# Patient Record
Sex: Male | Born: 1960 | Race: White | Hispanic: Yes | Marital: Married | Smoking: Never smoker
Health system: Southern US, Community
[De-identification: ages and names within clinical notes are randomized; demographics above are authoritative.]

## PROBLEM LIST (undated history)

## (undated) DIAGNOSIS — C801 Malignant (primary) neoplasm, unspecified: Secondary | ICD-10-CM

## (undated) DIAGNOSIS — H9190 Unspecified hearing loss, unspecified ear: Secondary | ICD-10-CM

## (undated) DIAGNOSIS — I493 Ventricular premature depolarization: Secondary | ICD-10-CM

## (undated) DIAGNOSIS — E785 Hyperlipidemia, unspecified: Secondary | ICD-10-CM

## (undated) DIAGNOSIS — C61 Malignant neoplasm of prostate: Secondary | ICD-10-CM

## (undated) DIAGNOSIS — E291 Testicular hypofunction: Secondary | ICD-10-CM

## (undated) DIAGNOSIS — K219 Gastro-esophageal reflux disease without esophagitis: Secondary | ICD-10-CM

## (undated) DIAGNOSIS — R972 Elevated prostate specific antigen [PSA]: Secondary | ICD-10-CM

## (undated) HISTORY — DX: Unspecified hearing loss, unspecified ear: H91.90

## (undated) HISTORY — DX: Gastro-esophageal reflux disease without esophagitis: K21.9

## (undated) HISTORY — PX: PROSTATE BIOPSY: SHX241

## (undated) HISTORY — PX: TONSILLECTOMY: SHX5217

## (undated) HISTORY — DX: Testicular hypofunction: E29.1

## (undated) HISTORY — DX: Malignant (primary) neoplasm, unspecified: C80.1

## (undated) HISTORY — PX: PROSTATE SURGERY: SHX751

---

## 2008-08-25 ENCOUNTER — Encounter (INDEPENDENT_AMBULATORY_CARE_PROVIDER_SITE_OTHER): Payer: Self-pay | Admitting: Cardiology

## 2008-08-25 ENCOUNTER — Ambulatory Visit (HOSPITAL_COMMUNITY): Admission: RE | Admit: 2008-08-25 | Discharge: 2008-08-25 | Payer: Self-pay | Admitting: Cardiology

## 2011-10-31 ENCOUNTER — Ambulatory Visit (INDEPENDENT_AMBULATORY_CARE_PROVIDER_SITE_OTHER): Payer: 59 | Admitting: Family Medicine

## 2011-10-31 VITALS — BP 122/74 | HR 62 | Temp 98.3°F | Resp 16 | Ht 70.0 in | Wt 250.4 lb

## 2011-10-31 DIAGNOSIS — K219 Gastro-esophageal reflux disease without esophagitis: Secondary | ICD-10-CM | POA: Insufficient documentation

## 2011-10-31 DIAGNOSIS — E291 Testicular hypofunction: Secondary | ICD-10-CM | POA: Insufficient documentation

## 2011-10-31 DIAGNOSIS — J029 Acute pharyngitis, unspecified: Secondary | ICD-10-CM

## 2011-10-31 DIAGNOSIS — E236 Other disorders of pituitary gland: Secondary | ICD-10-CM

## 2011-10-31 DIAGNOSIS — L089 Local infection of the skin and subcutaneous tissue, unspecified: Secondary | ICD-10-CM

## 2011-10-31 HISTORY — DX: Gastro-esophageal reflux disease without esophagitis: K21.9

## 2011-10-31 HISTORY — DX: Testicular hypofunction: E29.1

## 2011-10-31 MED ORDER — DOXYCYCLINE HYCLATE 100 MG PO TABS: 100.0000 mg | ORAL_TABLET | Freq: Two times a day (BID) | ORAL | Status: DC

## 2011-10-31 NOTE — Progress Notes (Signed)
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 3 problems: 1. Sore throat x 5 days, with fluctuating temp, and one day of diarrhea on day 2 of illness. 2. Skin infection, suprapubic area with 3-4 weeks intermittent d/c 3. Follow up for hypogonadism.  Still having trouble losing weight.  Last testosterone was 152 8 months ago.  Objective: Very muscular heavyset man in no acute distress, cooperative and friendly  HEENT: Mild erythema in posterior pharynx, SOM and left ear, no adenopathy or thyromegaly.  Skin: 1 cm indurated area at the belt line in the suprapubic region with no drainage, violaceous in color  Assessment: Pharyngitis, stat infection of the skin, hypergonadism.  Plan:  Doxycycline 100 twice a day x7 days  Check total testosterone, consider testosterone replacement

## 2011-11-02 ENCOUNTER — Telehealth: Payer: Self-pay

## 2011-11-02 LAB — TESTOSTERONE: Testosterone: 192.74 ng/dL — ABNORMAL LOW (ref 250–890)

## 2011-11-02 NOTE — Telephone Encounter (Signed)
Pt would like to know if his lab results are in yet.

## 2011-11-03 NOTE — Telephone Encounter (Signed)
Christopher [name] already spoke with pt today and informed lab results

## 2012-01-04 ENCOUNTER — Ambulatory Visit (INDEPENDENT_AMBULATORY_CARE_PROVIDER_SITE_OTHER): Payer: 59 | Admitting: Family Medicine

## 2012-01-04 VITALS — BP 139/76 | HR 61 | Temp 98.2°F | Resp 18 | Ht 71.5 in | Wt 238.0 lb

## 2012-01-04 DIAGNOSIS — L709 Acne, unspecified: Secondary | ICD-10-CM

## 2012-01-04 DIAGNOSIS — E291 Testicular hypofunction: Secondary | ICD-10-CM

## 2012-01-04 DIAGNOSIS — E236 Other disorders of pituitary gland: Secondary | ICD-10-CM

## 2012-01-04 DIAGNOSIS — R51 Headache: Secondary | ICD-10-CM

## 2012-01-04 LAB — POCT CBC
MCH, POC: 29.3 pg (ref 27–31.2)
MCV: 86.6 fL (ref 80–97)
MID (cbc): 0.5 (ref 0–0.9)
POC LYMPH PERCENT: 46.2 %L (ref 10–50)
Platelet Count, POC: 280 10*3/uL (ref 142–424)
RDW, POC: 13.3 %
WBC: 7.2 10*3/uL (ref 4.6–10.2)

## 2012-01-04 MED ORDER — CYCLOBENZAPRINE HCL 10 MG PO TABS: 10.0000 mg | ORAL_TABLET | Freq: Every evening | ORAL | Status: DC | PRN

## 2012-01-04 NOTE — Progress Notes (Signed)
  Subjective:    Patient ID: Christopher [name], male    DOB: Feb 12, 1961, 51 y.o.   MRN: [id number]  HPI Patient presents complaining of sharp shooting pains that are fleeting for the last 7-10 days. Always in temporal/parietal region.  Denies N/V, photo or phonophobia Patient states the pain more frequent at night.  Denies N,V or focal deficits. No history of migraines or cluster headaches No recent illness No stressors  Would like to be tested for ASA allergy; mother told him he was allergic to aspirin as a child(facial swelling)  H/O of hypogonadism- would like to have his testosterone level checked Review of Systems     Objective:   Physical Exam  Constitutional: He appears well-developed.  HENT:  Head: Normocephalic and atraumatic.  Right Ear: External ear normal.  Left Ear: External ear normal.  Nose: Nose normal.  Mouth/Throat: Oropharynx is clear and moist.  Eyes: Pupils are equal, round, and reactive to light.  Neck: Normal range of motion. Neck supple. No thyromegaly present.  Cardiovascular: Normal rate, regular rhythm and normal heart sounds.   Pulmonary/Chest: Effort normal and breath sounds normal.  Abdominal: Soft. Bowel sounds are normal. There is no hepatosplenomegaly.  Musculoskeletal: Normal range of motion.  Neurological: He is alert. He has normal strength. He displays normal reflexes. No cranial nerve deficit.  Reflex Scores:      Bicep reflexes are 2+ on the right side and 2+ on the left side.      Patellar reflexes are 2+ on the right side and 2+ on the left side. Skin: Skin is warm.       Erythema and papules on face          Assessment & Plan:   1. Headache  POCT CBC, cyclobenzaprine (FLEXERIL) 10 MG tablet, CT Head Wo Contrast, Sedimentation Rate  2. Hypogonadism male  Testosterone  3. Acne      Anticipatory guidance Follow up after imaging and BW back

## 2012-01-05 LAB — SEDIMENTATION RATE: Sed Rate: 1 mm/hr (ref 0–16)

## 2012-01-09 ENCOUNTER — Ambulatory Visit
Admission: RE | Admit: 2012-01-09 | Discharge: 2012-01-09 | Disposition: A | Discharge status: Home / Self Care | Payer: Self-pay | Source: Ambulatory Visit | Attending: Family Medicine | Admitting: Family Medicine

## 2012-01-09 DIAGNOSIS — R51 Headache: Secondary | ICD-10-CM

## 2012-01-10 ENCOUNTER — Telehealth: Payer: Self-pay

## 2012-01-10 NOTE — Telephone Encounter (Signed)
CT of the brain was normal. Blood work shows low testosterone If patient would like treatment for his low testosterone encourage him to RTC. I will need to review with him proper use of testosterone.

## 2012-01-10 NOTE — Telephone Encounter (Signed)
Can you please look over these test and let me know what to tell wife.

## 2012-01-10 NOTE — Telephone Encounter (Signed)
Spoke with wife gave results. She would like to know what the next step is regarding the pain in his head. Please advise

## 2012-01-10 NOTE — Telephone Encounter (Signed)
LINDA STATES HER HUSBAND HAD A CT AND LAB WORK DONE AND WOULD LIKE TO KNOW RESULTS PLEASE CALL [phone]

## 2012-01-13 ENCOUNTER — Telehealth: Payer: Self-pay | Admitting: Family Medicine

## 2012-01-14 NOTE — Telephone Encounter (Signed)
Notes Recorded by Dois Davenport, MD on 01/13/2012 at 2:54 PM Patient aware of lab and CT results. Wife(see telephone note) would like clarity of the fleeting pains  patient experiences in his head. With normal labs and pattern not consistent with a particular headache syndrome he can try symptomatic treatment with flexeril 10 QHS or we can be refer him to Dr. Vela Prose for evaluation/ reassurance.  Additionally his Testosterone is quite low and I would encourage a follow up visit for treatment of his hypogonadism given he is only 51 years old.

## 2012-01-23 NOTE — Telephone Encounter (Signed)
See lab results.  

## 2012-02-12 ENCOUNTER — Ambulatory Visit (INDEPENDENT_AMBULATORY_CARE_PROVIDER_SITE_OTHER): Payer: 59 | Admitting: Family Medicine

## 2012-02-12 DIAGNOSIS — J309 Allergic rhinitis, unspecified: Secondary | ICD-10-CM

## 2012-02-12 MED ORDER — MOMETASONE FUROATE 50 MCG/ACT NA SUSP: 2.0000 | Freq: Every day | NASAL | Status: DC

## 2012-02-12 MED ORDER — CETIRIZINE HCL 10 MG PO TABS: 10.0000 mg | ORAL_TABLET | Freq: Every day | ORAL | Status: DC

## 2012-02-12 NOTE — Progress Notes (Signed)
  Subjective:    Patient ID: Christopher [name], male    DOB: [birth year], 51 y.o.   MRN: [id number]  HPI  Patient complains of ST that started yesterday. Pain when talking Denies facial congestion, PND or cough. Denies fever or chills  No sick contacts Review of Systems     Objective:   Physical Exam  Constitutional: He appears well-developed.  HENT:  Nose: Mucosal edema present.       Clear PND without erythema to the posterior pharnx  Neck: Neck supple.  Lymphadenopathy:    He has no cervical adenopathy.          Assessment & Plan:   1. Allergic rhinitis  mometasone (NASONEX) 50 MCG/ACT nasal spray, cetirizine (ZYRTEC) 10 MG tablet   Anticipatory guidance

## 2012-06-02 ENCOUNTER — Ambulatory Visit (INDEPENDENT_AMBULATORY_CARE_PROVIDER_SITE_OTHER): Payer: 59 | Admitting: Family Medicine

## 2012-06-02 VITALS — BP 122/80 | HR 71 | Temp 98.4°F | Resp 18 | Ht 70.0 in | Wt 271.0 lb

## 2012-06-02 DIAGNOSIS — R002 Palpitations: Secondary | ICD-10-CM

## 2012-06-02 NOTE — Patient Instructions (Addendum)
We will set you up to see Dr. York Spaniel office again to have an "event monitor" done for your heart.  If you have any problems in the meantime let us know!  If you are having chest pain or other concerning symptoms please get help right away

## 2012-06-02 NOTE — Progress Notes (Signed)
[facility] [address] [place] [address] [phone]  Date:  06/02/2012   Name:  Christopher [name]   DOB:  [birth year]   MRN:  [id number]  PCP:  No primary provider on file.    Chief Complaint: Palpitations   History of Present Illness:  Christopher [name] is a 51 y.o. 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  Since yesterday he has noted them very often- he noted overnight and again this morning. They are occuring so often that he got worried.  He can feel SOB when he has more of the PVCs, but is ok when they ease off.  He has not noted any CP.    Generally he noted occasional PVC during the evening when he is relaxed.  He may have a few every evening.  He does not have any other history of cardiac problems that he is aware of.  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    Khalil saw Dr. Donnie Aho in 2010 and had a cardiac evaluation.  He did an ETT which was negative.  It looks like he also had an echo but I cannot see this result.  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    He does not note any unusual stressors. He did eat more heaving this past weekend as he was out of town.  He feels that his sleep has been ok recently.  [name] does not generally use caffeine except for the occasional soda, and this has not changed. He is not using any supplements/ weight loss products, etc.  He does not use tobacco or alcohol, and does not use drugs.    Patient Active Problem List  Diagnosis  . GERD (gastroesophageal reflux disease)  . Hypogonadism male    No past medical history on file.  No past surgical history on file.  History  Substance Use Topics  . Smoking status: Never Smoker   . Smokeless tobacco: Not on file  . Alcohol Use: Not on file    No family history on file.  Allergies  Allergen Reactions  . Asa  Arthritis Strength-Antacid (Aspirin Buffered)   . Penicillins   . Sulfa Antibiotics     Medication list has been reviewed and updated.  Current Outpatient Prescriptions on File Prior to Visit  Medication Sig Dispense Refill  . acetaminophen (TYLENOL) 325 MG tablet Take 650 mg by mouth every 6 (six) hours as needed.      . cetirizine (ZYRTEC) 10 MG tablet Take 1 tablet (10 mg total) by mouth daily.  30 tablet  11  . minocycline (DYNACIN) 50 MG tablet Take 50 mg by mouth 2 (two) times daily.      . mometasone (NASONEX) 50 MCG/ACT nasal spray Place 2 sprays into the nose daily.  1 g  12  . Sulfacetamide Sodium 10 % CREA Apply topically.        Review of Systems:  As per HPI- otherwise negative. No other symptoms noted, he does not have CP  Physical Examination: Filed Vitals:   06/02/12 1417  BP: 122/80  Pulse: 71  Temp: 98.4 F (36.9 C)  Resp: 18   Filed Vitals:   06/02/12 1417  Height: 5\' 10"  (1.778 m)  Weight: 271 lb (122.925 kg)   Body mass index  is 38.88 kg/(m^2). Ideal Body Weight: Weight in (lb) to have BMI = 25: 173.9   GEN: WDWN, NAD, Non-toxic, A & O x 3 HEENT: Atraumatic, Normocephalic. Neck supple. No masses, No LAD. Bilateral TM wnl, oropharynx normal.  PEERL,EOMI.   Ears and Nose: No external deformity. CV: RRR, No M/G/R. No JVD. No thrill. No extra heart sounds.  I do not hear an arrythmia.   PULM: CTA B, no wheezes, crackles, rhonchi. No retractions. No resp. distress. No accessory muscle use. ABD: S, NT, ND, +BS. No rebound. No HSM. EXTR: No c/c/e NEURO Normal gait.  PSYCH: Normally interactive. Conversant. Not depressed or anxious appearing.  Calm demeanor.   EKG: NSR, no ST elevation or depression.  Several pages of rhythm reveals 2 PACs.    Assessment and Plan: 1. Palpitations  EKG 12-Lead, Cardiac event monitor   Quinterious has a long history of PVCs/ PACs, but he has had more than usual for the last 24 hours.  Spoke with Dr. Donnie Aho who has seen him  in the past.  He recommended looking for any possible causes such as excessive caffeine use (there do not seem to be any) and to have [name] do an event monitor.  We will arrange a cardiac event monitor and he will follow-up with Dr. Donnie Aho.  See pt instructions for more.   Abbe Amsterdam, MD

## 2012-11-13 ENCOUNTER — Ambulatory Visit (INDEPENDENT_AMBULATORY_CARE_PROVIDER_SITE_OTHER): Payer: 59 | Admitting: Emergency Medicine

## 2012-11-13 VITALS — BP 123/75 | HR 66 | Temp 98.6°F | Resp 16 | Ht 70.5 in | Wt 238.2 lb

## 2012-11-13 DIAGNOSIS — R52 Pain, unspecified: Secondary | ICD-10-CM

## 2012-11-13 DIAGNOSIS — E291 Testicular hypofunction: Secondary | ICD-10-CM

## 2012-11-13 DIAGNOSIS — J3489 Other specified disorders of nose and nasal sinuses: Secondary | ICD-10-CM

## 2012-11-13 DIAGNOSIS — R7989 Other specified abnormal findings of blood chemistry: Secondary | ICD-10-CM

## 2012-11-13 DIAGNOSIS — R0981 Nasal congestion: Secondary | ICD-10-CM

## 2012-11-13 DIAGNOSIS — J329 Chronic sinusitis, unspecified: Secondary | ICD-10-CM

## 2012-11-13 MED ORDER — AZITHROMYCIN 250 MG PO TABS: ORAL_TABLET | ORAL | Status: DC

## 2012-11-13 NOTE — Progress Notes (Signed)
  Subjective:    Patient ID: Christopher [name], male    DOB: Jan 25, 1961, 52 y.o.   MRN: [id number]  HPI Pt presents with aches and congestion since Sunday. Feels better with Tylenol cold and sinus but when it wears off, he feels bad again. Slight temp at ~99.2 for the last 2-3 days.   No cough or ST. Blowing out yellow mucus from his nose.  No else at home is sick. Had not had flu shot this year.  Also needs Testosterone checked. He has a history of low testosterone with his last testosterone level about one year ago being 193      Review of Systems     Objective:   Physical Exam HEENT exam reveals nasal congestion. TMs are clear. Throat is normal. Neck supple chest is clear to both auscultation and percussion. Heart regular rate no murmurs   Results for orders placed in visit on 11/13/12  POCT INFLUENZA A/B      Result Value Range   Influenza A, POC Negative     Influenza B, POC Negative          Assessment & Plan:  Testosterone level and PSA levels were done today. We'll treat with a Z-Pak . He has a history of low testosterone so we'll check his testosterone level today .

## 2012-11-14 LAB — TESTOSTERONE, FREE, TOTAL, SHBG: Testosterone-% Free: 1.9 % (ref 1.6–2.9)

## 2012-12-04 ENCOUNTER — Encounter: Payer: Self-pay | Admitting: Family Medicine

## 2012-12-04 ENCOUNTER — Ambulatory Visit (INDEPENDENT_AMBULATORY_CARE_PROVIDER_SITE_OTHER): Payer: 59 | Admitting: Family Medicine

## 2012-12-04 VITALS — BP 130/80 | HR 55 | Temp 98.0°F | Resp 16 | Ht 69.5 in | Wt 230.0 lb

## 2012-12-04 DIAGNOSIS — E291 Testicular hypofunction: Secondary | ICD-10-CM

## 2012-12-04 DIAGNOSIS — Z23 Encounter for immunization: Secondary | ICD-10-CM

## 2012-12-04 DIAGNOSIS — Z0000 Encounter for general adult medical examination without abnormal findings: Secondary | ICD-10-CM

## 2012-12-04 LAB — POCT URINALYSIS DIPSTICK
Bilirubin, UA: NEGATIVE
Glucose, UA: NEGATIVE
Ketones, UA: NEGATIVE
Leukocytes, UA: NEGATIVE
Nitrite, UA: NEGATIVE
Protein, UA: NEGATIVE
Spec Grav, UA: 1.01
Urobilinogen, UA: 0.2
pH, UA: 7

## 2012-12-04 LAB — CBC
HCT: 41.4 % (ref 39.0–52.0)
Hemoglobin: 14.3 g/dL (ref 13.0–17.0)
MCH: 28.7 pg (ref 26.0–34.0)
MCHC: 34.5 g/dL (ref 30.0–36.0)
MCV: 83.1 fL (ref 78.0–100.0)
Platelets: 287 10*3/uL (ref 150–400)
RBC: 4.98 MIL/uL (ref 4.22–5.81)
RDW: 13.8 % (ref 11.5–15.5)
WBC: 4.4 10*3/uL (ref 4.0–10.5)

## 2012-12-04 MED ORDER — TETANUS-DIPHTH-ACELL PERTUSSIS 5-2.5-18.5 LF-MCG/0.5 IM SUSP: 0.5000 mL | Freq: Once | INTRAMUSCULAR | Status: DC

## 2012-12-04 NOTE — Patient Instructions (Signed)
Results for orders placed in visit on 11/13/12  PSA      Result Value Range   PSA 0.79  <=4.00 ng/mL  TESTOSTERONE, FREE, TOTAL      Result Value Range   Testosterone 215 (*) 300 - 890 ng/dL   Sex Hormone Binding 34  13 - 71 nmol/L   Testosterone, Free 40.3 (*) 47.0 - 244.0 pg/mL   Testosterone-% Freee. 1.9  1.6 - 2.9 %  POCT INFLUENZA A/B      Result Value Range   Influenza A, POC Negative     Influenza B, POC Negative

## 2012-12-04 NOTE — Progress Notes (Signed)
  Subjective:    Patient ID: Christopher [name], male    DOB: [birth year], 52 y.o.   MRN: [id number]  HPI Exercise:  5x per week:  Gym:  Lifting weight and cardio Weight:  Lost weight over the holidays by more regular exercise. 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   HENT: Negative.   Eyes: Negative.   Respiratory: Negative.   Cardiovascular: Negative.   Gastrointestinal: Negative.   Endocrine: Negative.   Genitourinary: Negative.   Musculoskeletal: Positive for back pain.  Skin: Negative.   Allergic/Immunologic: Negative.   Neurological: Negative.   Hematological: Negative.   Psychiatric/Behavioral: Negative.    F/H prostate cancer (father)    Objective:   Physical Exam Well adult middle-aged adult male in no acute distress HEENT: Normal TMs: Normal Neck: Supple no adenopathy or thyromegaly, no bruits, Oropharynx: Good dentition, no oral lesions Chest: Clear Heart: Regular without murmur, gallop or rub Abdomen: Soft nontender without HSM Genitourinary: Normal without hernia. Results for orders placed in visit on 12/04/12  POCT URINALYSIS DIPSTICK      Result Value Range   Color, UA yellow     Clarity, UA clear     Glucose, UA neg     Bilirubin, UA neg     Ketones, UA neg     Spec Grav, UA 1.010     Blood, UA trace     pH, UA 7.0     Protein, UA neg     Urobilinogen, UA 0.2     Nitrite, UA neg     Leukocytes, UA Negative         Assessment & Plan:  Hypogonadism male - Plan: Testosterone, Comprehensive metabolic panel, Lipid Panel, CBC, POCT urinalysis dipstick  Routine general medical examination at a health care facility - Plan: Testosterone, Comprehensive metabolic panel, Lipid Panel, CBC, POCT urinalysis dipstick, Ambulatory referral to Gastroenterology, TDaP (BOOSTRIX) injection 0.5 mL  Continue exercise.

## 2012-12-05 LAB — LIPID PANEL
Cholesterol: 184 mg/dL (ref 0–200)
HDL: 37 mg/dL — ABNORMAL LOW (ref >39)
LDL Cholesterol: 134 mg/dL — ABNORMAL HIGH (ref 0–99)
Total CHOL/HDL Ratio: 5 Ratio
Triglycerides: 64 mg/dL (ref <150)
VLDL: 13 mg/dL (ref 0–40)

## 2012-12-05 LAB — COMPREHENSIVE METABOLIC PANEL
ALT: 16 U/L (ref 0–53)
AST: 24 U/L (ref 0–37)
Albumin: 4.5 g/dL (ref 3.5–5.2)
Alkaline Phosphatase: 58 U/L (ref 39–117)
BUN: 19 mg/dL (ref 6–23)
CO2: 27 mEq/L (ref 19–32)
Calcium: 9.5 mg/dL (ref 8.4–10.5)
Chloride: 101 mEq/L (ref 96–112)
Creat: 1.02 mg/dL (ref 0.50–1.35)
Glucose, Bld: 93 mg/dL (ref 70–99)
Potassium: 4.3 mEq/L (ref 3.5–5.3)
Sodium: 137 mEq/L (ref 135–145)
Total Bilirubin: 1.3 mg/dL — ABNORMAL HIGH (ref 0.3–1.2)
Total Protein: 7.2 g/dL (ref 6.0–8.3)

## 2012-12-05 LAB — TESTOSTERONE: Testosterone: 342 ng/dL (ref 300–890)

## 2013-04-28 ENCOUNTER — Ambulatory Visit (INDEPENDENT_AMBULATORY_CARE_PROVIDER_SITE_OTHER): Payer: 59 | Admitting: Family Medicine

## 2013-04-28 VITALS — BP 120/72 | HR 58 | Temp 97.9°F | Resp 18 | Ht 71.5 in | Wt 214.0 lb

## 2013-04-28 DIAGNOSIS — R39198 Other difficulties with micturition: Secondary | ICD-10-CM

## 2013-04-28 DIAGNOSIS — E291 Testicular hypofunction: Secondary | ICD-10-CM

## 2013-04-28 DIAGNOSIS — J069 Acute upper respiratory infection, unspecified: Secondary | ICD-10-CM

## 2013-04-28 DIAGNOSIS — R0982 Postnasal drip: Secondary | ICD-10-CM

## 2013-04-28 NOTE — Progress Notes (Signed)
[facility]:  Office Visit  Chief Complaint:  Chief Complaint  Patient presents with  . Sore Throat    x1 week  . Generalized Body Aches    HPI: Christopher [name] is a 52 y.o. male who complains of : 1. BPH-decrease urine stream x 6 weeks, no other sxs 2. 1 week history of viral illness, he has PND. Muscle aches.  Sorethroat getting better. Denies allergies/asthma, has had tonsillectomy. No recent travels or sick contacts. Has had subjective fevers.  Tried alt water gargles and nothing else.  3. He has family hx of prostate cancer, dad dx in his 27s 4. He would like tetstoterone check. Not on any testosterone supplement. He denies libido issues. He does work out a lot and emphatically denies steroid use.   History reviewed. No pertinent past medical history. Past Surgical History  Procedure Laterality Date  . Tonsillectomy     History   Social History  . Marital Status: Married    Spouse Name: N/A    Number of Children: N/A  . Years of Education: N/A   Occupational History  . Computer Programmer [employer]   Social History Main Topics  . Smoking status: Never Smoker   . Smokeless tobacco: None  . Alcohol Use: No  . Drug Use: No  . Sexually Active: Yes   Other Topics Concern  . None   Social History Narrative   Married.   Family History  Problem Relation Age of Onset  . Cancer Father   . Heart disease Mother    Allergies  Allergen Reactions  . Asa Arthritis Strength-Antacid (Aspirin Buffered)   . Penicillins   . Sulfa Antibiotics    Prior to Admission medications   Not on File     ROS: The patient denies fevers, chills, night sweats, unintentional weight loss, chest pain, palpitations, wheezing, dyspnea on exertion, nausea, vomiting, abdominal pain, dysuria, hematuria, melena, numbness, weakness, or tingling.   All other systems have been reviewed and were otherwise negative with the exception of those mentioned in the HPI and as  above.    PHYSICAL EXAM: Filed Vitals:   04/28/13 1638  BP: 120/72  Pulse: 58  Temp: 97.9 F (36.6 C)  Resp: 18   Filed Vitals:   04/28/13 1638  Height: 5' 11.5" (1.816 m)  Weight: 214 lb (97.07 kg)   Body mass index is 29.43 kg/(m^2).  General: Alert, no acute distress HEENT:  Normocephalic, atraumatic, oropharynx patent. No exudates. TM nl. No sinus tendereness Cardiovascular:  Regular rate and rhythm, no rubs murmurs or gallops.  No Carotid bruits, radial pulse intact. No pedal edema.  Respiratory: Clear to auscultation bilaterally.  No wheezes, rales, or rhonchi.  No cyanosis, no use of accessory musculature GI: No organomegaly, abdomen is soft and non-tender, positive bowel sounds.  No masses. Skin: No rashes. Neurologic: Facial musculature symmetric. Psychiatric: Patient is appropriate throughout our interaction. Lymphatic: No cervical lymphadenopathy Musculoskeletal: Gait intact. Prostate exam nl   LABS:    EKG/XRAY:   Primary read interpreted by Dr. Conley Rolls at [facility].   ASSESSMENT/PLAN: Encounter Diagnoses  Name Primary?  . PND (post-nasal drip) Yes  . Decreased urine stream   . Hypogonadism male   . Viral URI     Patient to return for lab visit only in AM for following labs CBC, free and total testerone, PSA F/u in AM I will call with his test results once we get them   LE, THAO PHUONG, DO  04/28/2013 6:02 PM

## 2013-04-29 ENCOUNTER — Other Ambulatory Visit: Payer: Self-pay | Admitting: Family Medicine

## 2013-04-29 ENCOUNTER — Other Ambulatory Visit (INDEPENDENT_AMBULATORY_CARE_PROVIDER_SITE_OTHER): Payer: 59

## 2013-04-29 ENCOUNTER — Telehealth: Payer: Self-pay | Admitting: Radiology

## 2013-04-29 DIAGNOSIS — R39198 Other difficulties with micturition: Secondary | ICD-10-CM

## 2013-04-29 DIAGNOSIS — J069 Acute upper respiratory infection, unspecified: Secondary | ICD-10-CM

## 2013-04-29 LAB — PSA: PSA: 0.84 ng/mL (ref <=4.00)

## 2013-04-29 LAB — POCT CBC
Granulocyte percent: 60.2 %G (ref 37–80)
HCT, POC: 44.5 % (ref 43.5–53.7)
Hemoglobin: 14.3 g/dL (ref 14.1–18.1)
Lymph, poc: 2 (ref 0.6–3.4)
MCH, POC: 29.6 pg (ref 27–31.2)
MCHC: 32.1 g/dL (ref 31.8–35.4)
MCV: 92.1 fL (ref 80–97)
MID (cbc): 0.5 (ref 0–0.9)
MPV: 8.6 fL (ref 0–99.8)
POC Granulocyte: 3.8 (ref 2–6.9)
POC LYMPH PERCENT: 32.4 % (ref 10–50)
POC MID %: 7.4 %M (ref 0–12)
Platelet Count, POC: 249 10*3/uL (ref 142–424)
RBC: 4.83 M/uL (ref 4.69–6.13)
RDW, POC: 13.8 %
WBC: 6.3 10*3/uL (ref 4.6–10.2)

## 2013-04-29 NOTE — Telephone Encounter (Signed)
Message copied by Caffie Damme on Wed Apr 29, 2013  3:16 PM ------      Message from: LE, [place] P      Created: Wed Apr 29, 2013  2:49 PM       Did we collect lab for testosterone for him the morning? I saw the CBC and also the PSA but not the testosteron, it states it still needs to be collected? Thanks Tle ------

## 2013-04-29 NOTE — Progress Notes (Signed)
Patient here for labs only. 

## 2013-04-29 NOTE — Telephone Encounter (Signed)
Yes, we can add this on, the lab did get the order. Thanks Terryn Redner

## 2013-04-30 LAB — TESTOSTERONE, FREE, TOTAL, SHBG
Sex Hormone Binding: 26 nmol/L (ref 13–71)
Testosterone, Free: 60.7 pg/mL (ref 47.0–244.0)
Testosterone-% Free: 2.2 % (ref 1.6–2.9)
Testosterone: 273 ng/dL — ABNORMAL LOW (ref 300–890)

## 2013-05-02 ENCOUNTER — Telehealth: Payer: Self-pay | Admitting: Family Medicine

## 2013-05-02 NOTE — Telephone Encounter (Addendum)
Spoke to patient about labs.

## 2013-07-23 ENCOUNTER — Other Ambulatory Visit: Payer: Self-pay

## 2013-12-17 ENCOUNTER — Encounter: Payer: Self-pay | Admitting: Family Medicine

## 2013-12-17 ENCOUNTER — Ambulatory Visit (INDEPENDENT_AMBULATORY_CARE_PROVIDER_SITE_OTHER): Payer: 59 | Admitting: Family Medicine

## 2013-12-17 ENCOUNTER — Ambulatory Visit: Payer: 59

## 2013-12-17 VITALS — BP 124/58 | HR 67 | Temp 98.2°F | Resp 16 | Ht 69.5 in | Wt 240.2 lb

## 2013-12-17 DIAGNOSIS — Z0000 Encounter for general adult medical examination without abnormal findings: Secondary | ICD-10-CM

## 2013-12-17 DIAGNOSIS — IMO0001 Reserved for inherently not codable concepts without codable children: Secondary | ICD-10-CM

## 2013-12-17 DIAGNOSIS — R5381 Other malaise: Secondary | ICD-10-CM

## 2013-12-17 DIAGNOSIS — R29890 Loss of height: Secondary | ICD-10-CM

## 2013-12-17 DIAGNOSIS — H9190 Unspecified hearing loss, unspecified ear: Secondary | ICD-10-CM

## 2013-12-17 DIAGNOSIS — R002 Palpitations: Secondary | ICD-10-CM

## 2013-12-17 DIAGNOSIS — M25539 Pain in unspecified wrist: Secondary | ICD-10-CM

## 2013-12-17 DIAGNOSIS — Z125 Encounter for screening for malignant neoplasm of prostate: Secondary | ICD-10-CM

## 2013-12-17 DIAGNOSIS — M25531 Pain in right wrist: Secondary | ICD-10-CM

## 2013-12-17 DIAGNOSIS — R5383 Other fatigue: Secondary | ICD-10-CM

## 2013-12-17 HISTORY — DX: Reserved for inherently not codable concepts without codable children: IMO0001

## 2013-12-17 HISTORY — DX: Unspecified hearing loss, unspecified ear: H91.90

## 2013-12-17 LAB — POCT URINALYSIS DIPSTICK
Bilirubin, UA: NEGATIVE
Glucose, UA: NEGATIVE
Ketones, UA: NEGATIVE
Leukocytes, UA: NEGATIVE
Nitrite, UA: NEGATIVE
Protein, UA: NEGATIVE
Spec Grav, UA: 1.01
Urobilinogen, UA: 0.2
pH, UA: 6.5

## 2013-12-17 LAB — COMPLETE METABOLIC PANEL WITHOUT GFR
ALT: 24 U/L (ref 0–53)
AST: 32 U/L (ref 0–37)
Albumin: 4.1 g/dL (ref 3.5–5.2)
Alkaline Phosphatase: 59 U/L (ref 39–117)
BUN: 17 mg/dL (ref 6–23)
CO2: 27 meq/L (ref 19–32)
Calcium: 9.1 mg/dL (ref 8.4–10.5)
Chloride: 102 meq/L (ref 96–112)
Creat: 1.39 mg/dL — ABNORMAL HIGH (ref 0.50–1.35)
GFR, Est African American: 67 mL/min
GFR, Est Non African American: 58 mL/min — ABNORMAL LOW
Glucose, Bld: 87 mg/dL (ref 70–99)
Potassium: 4.2 meq/L (ref 3.5–5.3)
Sodium: 138 meq/L (ref 135–145)
Total Bilirubin: 1.2 mg/dL (ref 0.2–1.2)
Total Protein: 7.1 g/dL (ref 6.0–8.3)

## 2013-12-17 LAB — LIPID PANEL
Cholesterol: 175 mg/dL (ref 0–200)
HDL: 44 mg/dL (ref >39)
LDL Cholesterol: 118 mg/dL — ABNORMAL HIGH (ref 0–99)
Total CHOL/HDL Ratio: 4 Ratio
Triglycerides: 66 mg/dL (ref <150)
VLDL: 13 mg/dL (ref 0–40)

## 2013-12-17 LAB — CBC
HCT: 43.6 % (ref 39.0–52.0)
Hemoglobin: 14.9 g/dL (ref 13.0–17.0)
MCH: 28.6 pg (ref 26.0–34.0)
MCHC: 34.2 g/dL (ref 30.0–36.0)
MCV: 83.7 fL (ref 78.0–100.0)
Platelets: 247 10*3/uL (ref 150–400)
RBC: 5.21 MIL/uL (ref 4.22–5.81)
RDW: 13.2 % (ref 11.5–15.5)
WBC: 5.5 10*3/uL (ref 4.0–10.5)

## 2013-12-17 LAB — TESTOSTERONE: Testosterone: 375 ng/dL (ref 300–890)

## 2013-12-17 LAB — IFOBT (OCCULT BLOOD): IFOBT: NEGATIVE

## 2013-12-17 NOTE — Progress Notes (Signed)
Patient ID: Christopher [name] MRN: [id number], DOB: [birth year] 53 y.o. Date of Encounter: 12/17/2013, 7:54 AM  Primary Physician: No primary provider on file.  Chief Complaint: Physical (CPE)  HPI: 53 y.o. y/o male with history noted below here for CPE.  Married Dance movement psychotherapist, Airline pilot Patient complains of low energy He also complains of right hypothenar pain Also has left shoulder soreness  Finally, patient is concerned with his loss of 2" height   Review of Systems: Consitutional: No fever, chills, fatigue, night sweats, lymphadenopathy, or weight changes. Eyes: No visual changes, eye redness, or discharge. ENT/Mouth: Ears: No otalgia, tinnitus, hearing loss, discharge. Nose: No congestion, rhinorrhea, sinus pain, or epistaxis. Throat: No sore throat, post nasal drip, or teeth pain. Cardiovascular: No CP, palpitations, diaphoresis, DOE, edema, orthopnea, PND. Respiratory: No cough, hemoptysis, SOB, or wheezing. Gastrointestinal: No anorexia, dysphagia, reflux, pain, nausea, vomiting, hematemesis, diarrhea, constipation, BRBPR, or melena. Genitourinary: No dysuria, frequency, urgency, hematuria, incontinence, nocturia, decreased urinary stream, discharge, impotence, or testicular pain/masses. Musculoskeletal: No decreased ROM, myalgias, stiffness, joint swelling, or weakness. Skin: No rash, erythema, lesion changes, pain, warmth, jaundice, or pruritis. Neurological: No headache, dizziness, syncope, seizures, tremors, memory loss, coordination problems, or paresthesias. Psychological: No anxiety, depression, hallucinations, SI/HI. Endocrine: No fatigue, polydipsia, polyphagia, polyuria, or known diabetes. All other systems were reviewed and are otherwise negative.  No past medical history on file.   Past Surgical History  Procedure Laterality Date  . Tonsillectomy      Home Meds:  Prior to Admission medications   Not on File    Allergies:  Allergies  Allergen  Reactions  . Asa Arthritis Strength-Antacid [Aspirin Buffered]   . Penicillins   . Sulfa Antibiotics     History   Social History  . Marital Status: Married    Spouse Name: N/A    Number of Children: N/A  . Years of Education: N/A   Occupational History  . [place] History Main Topics  . Smoking status: Never Smoker   . Smokeless tobacco: Not on file  . Alcohol Use: No  . Drug Use: No  . Sexual Activity: Yes   Other Topics Concern  . Not on file   Social History Narrative   Married.    Family History  Problem Relation Age of Onset  . Cancer Father   . Heart disease Mother     Physical Exam: Blood pressure 124/58, pulse 67, temperature 98.2 F (36.8 C), temperature source Oral, resp. rate 16, height 5' 9.5" (1.765 m), weight 240 lb 3.2 oz (108.954 kg), SpO2 99.00%.  General: Well developed, well nourished, in no acute distress. HEENT: Normocephalic, atraumatic. Conjunctiva pink, sclera non-icteric. Pupils 2 mm constricting to 1 mm, round, regular, and equally reactive to light and accomodation. EOMI. Internal auditory canal clear. TMs with good cone of light and with thickening bilaterally and decreased hearing. Nasal mucosa pink. Nares are without discharge. No sinus tenderness. Oral mucosa pink. Dentition good. Pharynx without exudate.   Neck: Supple. Trachea midline. No thyromegaly. Full ROM. No lymphadenopathy. Lungs: Clear to auscultation bilaterally without wheezes, rales, or rhonchi. Breathing is of normal effort and unlabored. Cardiovascular: RRR with S1 S2. No murmurs, rubs, or gallops appreciated. Distal pulses 2+ symmetrically. No carotid or abdominal bruits Abdomen: Soft, non-tender, non-distended with normoactive bowel sounds. No hepatosplenomegaly or masses. No rebound/guarding. No CVA tenderness. Without hernias.  Rectal: No external hemorrhoids or fissures. Rectal vault without masses.  Genitourinary:  circumcised male.  No penile lesions. Testes descended bilaterally, and smooth without tenderness or masses. Slight decrease in size of left testicle Musculoskeletal: Full range of motion and 5/5 strength throughout. Without swelling, atrophy, tenderness, crepitus, or warmth. Extremities without clubbing, cyanosis, or edema. Calves supple.  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 Skin: Warm and moist without erythema, ecchymosis, wounds, or rash. Neuro: A+Ox3. CN II-XII grossly intact. Moves all extremities spontaneously. Full sensation throughout. Normal gait. DTR 2+ throughout upper and lower extremities. Finger to nose intact. Psych:  Responds to questions appropriately with a normal affect.   Studies: CBC, CMET, Lipid, PSA, TSH, testosterone all pending.  UA:  Results for orders placed in visit on 12/17/13  POCT URINALYSIS DIPSTICK      Result Value Ref Range   Color, UA yellow     Clarity, UA clear     Glucose, UA neg     Bilirubin, UA neg     Ketones, UA neg     Spec Grav, UA 1.010     Blood, UA trace-int     pH, UA 6.5     Protein, UA neg     Urobilinogen, UA 0.2     Nitrite, UA neg     Leukocytes, UA Negative    IFOBT (OCCULT BLOOD)      Result Value Ref Range   IFOBT Negative      [facility] reading (PRIMARY) by  Dr. Joseph Art:  Negative hand and wrist. EKG: NSR  Assessment/Plan:  53 y.o. y/o  male here for CPE  -Suspicious that the low energy level is related to a low testosterone.  Patient is afraid of general anesthesia for his colonoscopy so I will also send him to a different gastroenterologist  The patient's at above average risk for cardiac disease given his positive family history for MI before age 66.   Screening for prostate cancer - Plan: PSA, IFOBT POC (occult bld, rslt in office)  Wrist pain, right - Plan: DG Wrist Complete Right, Ambulatory referral to Orthopedic Surgery  Palpitations - Plan: EKG 12-Lead,  COMPLETE METABOLIC PANEL WITH GFR, Testosterone, Lipid panel, Ambulatory referral to Cardiology  Routine general medical examination at a health care facility - Plan: CBC, COMPLETE METABOLIC PANEL WITH GFR, Testosterone, Lipid panel, POCT urinalysis dipstick, Ambulatory referral to Gastroenterology  Hearing impaired  Fatigue  Palpitations - strong f/h cardiac disease - Plan: EKG 12-Lead, COMPLETE METABOLIC PANEL WITH GFR, Testosterone, Lipid panel, Ambulatory referral to Cardiology  Height loss - Plan: DG Bone Density    Signed, Robyn Haber, MD 12/17/2013 7:54 AM

## 2013-12-17 NOTE — Patient Instructions (Signed)

## 2013-12-18 ENCOUNTER — Other Ambulatory Visit: Payer: Self-pay | Admitting: Family Medicine

## 2013-12-18 DIAGNOSIS — E291 Testicular hypofunction: Secondary | ICD-10-CM

## 2013-12-18 LAB — PSA: PSA: 0.9 ng/mL (ref <=4.00)

## 2013-12-20 ENCOUNTER — Encounter: Payer: Self-pay | Admitting: *Deleted

## 2013-12-25 ENCOUNTER — Other Ambulatory Visit: Payer: Self-pay | Admitting: Radiology

## 2013-12-25 DIAGNOSIS — R29890 Loss of height: Secondary | ICD-10-CM

## 2014-01-07 ENCOUNTER — Telehealth: Payer: Self-pay | Admitting: *Deleted

## 2014-01-07 ENCOUNTER — Ambulatory Visit (INDEPENDENT_AMBULATORY_CARE_PROVIDER_SITE_OTHER): Payer: 59 | Admitting: Family Medicine

## 2014-01-07 VITALS — BP 126/74 | HR 68 | Temp 98.6°F | Resp 16 | Ht 71.5 in | Wt 249.0 lb

## 2014-01-07 DIAGNOSIS — J069 Acute upper respiratory infection, unspecified: Secondary | ICD-10-CM

## 2014-01-07 NOTE — Progress Notes (Signed)
This chart was scribed for [employer]. Carlota Raspberry, MD by Marcha Dutton, ED Scribe. This patient was seen in room 3 and the patient's care was started at 1:21 PM.  Subjective:    Patient ID: Christopher [name], male    DOB: [birth year], 53 y.o.   MRN: [id number]  Chief Complaint  Patient presents with  . Sore Throat    x 4 days  . Generalized Body Aches  . Cough    HPI Christopher [name] is a 53 y.o. male  Pt reports sore throat that began 3 days ago. He also reports an associated cough productive of brown sputum, congestion the same color as his sputum, and body aches. He states he has taken tylenol to alleviate his symptoms but they always return. He used afrin a single time last night. Pt denies fever, recent travel, removal of any ticks or bug bites, hiking or camping in the woods.  He states his wife has the same symptoms that began earlier, and he believes she passed it to him.  No primary provider on file.  Patient Active Problem List   Diagnosis Date Noted  . Hearing impaired 12/17/2013  . GERD (gastroesophageal reflux disease) 10/31/2011  . Hypogonadism male 10/31/2011   History reviewed. No pertinent past medical history. Past Surgical History  Procedure Laterality Date  . Tonsillectomy     Allergies  Allergen Reactions  . Asa Arthritis Strength-Antacid [Aspirin Buffered]   . Penicillins   . Sulfa Antibiotics    Prior to Admission medications   Not on File   History   Social History  . Marital Status: Married    Spouse Name: N/A    Number of Children: N/A  . Years of Education: N/A   Occupational History  . [place] History Main Topics  . Smoking status: Never Smoker   . Smokeless tobacco: Not on file  . Alcohol Use: No  . Drug Use: No  . Sexual Activity: Yes   Other Topics Concern  . Not on file   Social History Narrative   Married.     Review of Systems  Constitutional: Negative for fever.  HENT: Positive for  congestion, rhinorrhea and sore throat.   Respiratory: Positive for cough. Negative for shortness of breath.   Musculoskeletal: Positive for myalgias (generalized).       Objective:   Physical Exam  Nursing note and vitals reviewed. Constitutional: He is oriented to person, place, and time. He appears well-developed and well-nourished.  HENT:  Head: Normocephalic and atraumatic.  Right Ear: Tympanic membrane, external ear and ear canal normal.  Left Ear: Tympanic membrane, external ear and ear canal normal.  Nose: Nose normal. No mucosal edema or rhinorrhea. Right sinus exhibits no maxillary sinus tenderness and no frontal sinus tenderness. Left sinus exhibits no maxillary sinus tenderness and no frontal sinus tenderness.  Mouth/Throat: Oropharynx is clear and moist and mucous membranes are normal. Mucous membranes are not dry. No oropharyngeal exudate, posterior oropharyngeal edema or posterior oropharyngeal erythema.  Eyes: Conjunctivae are normal. Pupils are equal, round, and reactive to light.  Neck: Neck supple.  Cardiovascular: Normal rate, regular rhythm, normal heart sounds and intact distal pulses.   No murmur heard. Pulmonary/Chest: Effort normal and breath sounds normal. He has no wheezes. He has no rhonchi. He has no rales.  Abdominal: Soft. There is no tenderness.  Lymphadenopathy:    He has no cervical adenopathy.  Neurological: He is alert and oriented to  person, place, and time.  Skin: Skin is warm and dry. No rash noted.  Psychiatric: He has a normal mood and affect. His behavior is normal.    Filed Vitals:   01/07/14 1254  BP: 126/74  Pulse: 68  Temp: 98.6 F (37 C)  TempSrc: Oral  Resp: 16  Height: 5' 11.5" (1.816 m)  Weight: 249 lb (112.946 kg)  SpO2: 97%       Assessment & Plan:  Christopher [name] is a 53 y.o. male Acute upper respiratory infections of unspecified site  Viral URI likely with PND with cough.  Sx care discussed, saline ns, alternating  afrin for few days only and stop then. Trial of mucinex and lozenges.  rtc precautions and h/o [name] below.    No orders of the defined types were placed in this encounter.   Patient Instructions  Saline nasal spray atleast 4 times per day, over the counter mucinex or mucinex DM if needed for cough, sore throat lozenges, and drink plenty of fluids.  You can alternate nostrils for the next 3-4 days when using Afrin., but stop using it after that time.  Return to the clinic or go to the nearest emergency room if any of your symptoms worsen or new symptoms occur. Upper Respiratory Infection, Adult An upper respiratory infection (URI) is also sometimes known as the common cold. The upper respiratory tract includes the nose, sinuses, throat, trachea, and bronchi. Bronchi are the airways leading to the lungs. Most people improve within 1 week, but symptoms can last up to 2 weeks. A residual cough may last even longer.  CAUSES Many different viruses can infect the tissues lining the upper respiratory tract. The tissues become irritated and inflamed and often become very moist. Mucus production is also common. A cold is contagious. You can easily spread the virus to others by oral contact. This includes kissing, sharing a glass, coughing, or sneezing. Touching your mouth or nose and then touching a surface, which is then touched by another person, can also spread the virus. SYMPTOMS  Symptoms typically develop 1 to 3 days after you come in contact with a cold virus. Symptoms vary from person to person. They may include:  Runny nose.  Sneezing.  Nasal congestion.  Sinus irritation.  Sore throat.  Loss of voice (laryngitis).  Cough.  Fatigue.  Muscle aches.  Loss of appetite.  Headache.  Low-grade fever. DIAGNOSIS  You might diagnose your own cold based on familiar symptoms, since most people get a cold 2 to 3 times a year. Your caregiver can confirm this based on your exam. Most  importantly, your caregiver can check that your symptoms are not due to another disease such as strep throat, sinusitis, pneumonia, asthma, or epiglottitis. Blood tests, throat tests, and X-rays are not necessary to diagnose a common cold, but they may sometimes be helpful in excluding other more serious diseases. Your caregiver will decide if any further tests are required. RISKS AND COMPLICATIONS  You may be at risk for a more severe case of the common cold if you smoke cigarettes, have chronic heart disease (such as heart failure) or lung disease (such as asthma), or if you have a weakened immune system. The very young and very old are also at risk for more serious infections. Bacterial sinusitis, middle ear infections, and bacterial pneumonia can complicate the common cold. The common cold can worsen asthma and chronic obstructive pulmonary disease (COPD). Sometimes, these complications can require emergency medical care and may  be life-threatening. PREVENTION  The best way to protect against getting a cold is to practice good hygiene. Avoid oral or hand contact with people with cold symptoms. Wash your hands often if contact occurs. There is no clear evidence that vitamin C, vitamin E, echinacea, or exercise reduces the chance of developing a cold. However, it is always recommended to get plenty of rest and practice good nutrition. TREATMENT  Treatment is directed at relieving symptoms. There is no cure. Antibiotics are not effective, because the infection is caused by a virus, not by bacteria. Treatment may include:  Increased fluid intake. Sports drinks offer valuable electrolytes, sugars, and fluids.  Breathing heated mist or steam (vaporizer or shower).  Eating chicken soup or other clear broths, and maintaining good nutrition.  Getting plenty of rest.  Using gargles or lozenges for comfort.  Controlling fevers with ibuprofen or acetaminophen as directed by your caregiver.  Increasing  usage of your inhaler if you have asthma. Zinc gel and zinc lozenges, taken in the first 24 hours of the common cold, can shorten the duration and lessen the severity of symptoms. Pain medicines may help with fever, muscle aches, and throat pain. A variety of non-prescription medicines are available to treat congestion and runny nose. Your caregiver can make recommendations and may suggest nasal or lung inhalers for other symptoms.  HOME CARE INSTRUCTIONS   Only take over-the-counter or prescription medicines for pain, discomfort, or fever as directed by your caregiver.  Use a warm mist humidifier or inhale steam from a shower to increase air moisture. This may keep secretions moist and make it easier to breathe.  Drink enough water and fluids to keep your urine clear or pale yellow.  Rest as needed.  Return to work when your temperature has returned to normal or as your caregiver advises. You may need to stay home longer to avoid infecting others. You can also use a face mask and careful hand washing to prevent spread of the virus. SEEK MEDICAL CARE IF:   After the first few days, you feel you are getting worse rather than better.  You need your caregiver's advice about medicines to control symptoms.  You develop chills, worsening shortness of breath, or brown or red sputum. These may be signs of pneumonia.  You develop yellow or brown nasal discharge or pain in the face, especially when you bend forward. These may be signs of sinusitis.  You develop a fever, swollen neck glands, pain with swallowing, or white areas in the back of your throat. These may be signs of strep throat. SEEK IMMEDIATE MEDICAL CARE IF:   You have a fever.  You develop severe or persistent headache, ear pain, sinus pain, or chest pain.  You develop wheezing, a prolonged cough, cough up blood, or have a change in your usual mucus (if you have chronic lung disease).  You develop sore muscles or a stiff  neck. Document Released: 02/27/2001 Document Revised: 11/26/2011 Document Reviewed: 01/05/2011 [facility] Patient Information 2014 [place], [place].     I personally performed the services described in this documentation, which was scribed in my presence. The recorded information has been reviewed and considered, and addended by me as needed.

## 2014-01-07 NOTE — Telephone Encounter (Signed)
Can you please schedule bone density?  This has been ordered three times and pt states that he has never received a call about it.

## 2014-01-07 NOTE — Patient Instructions (Signed)
Saline nasal spray atleast 4 times per day, over the counter mucinex or mucinex DM if needed for cough, sore throat lozenges, and drink plenty of fluids.  You can alternate nostrils for the next 3-4 days when using Afrin., but stop using it after that time.  Return to the clinic or go to the nearest emergency room if any of your symptoms worsen or new symptoms occur. Upper Respiratory Infection, Adult An upper respiratory infection (URI) is also sometimes known as the common cold. The upper respiratory tract includes the nose, sinuses, throat, trachea, and bronchi. Bronchi are the airways leading to the lungs. Most people improve within 1 week, but symptoms can last up to 2 weeks. A residual cough may last even longer.  CAUSES Many different viruses can infect the tissues lining the upper respiratory tract. The tissues become irritated and inflamed and often become very moist. Mucus production is also common. A cold is contagious. You can easily spread the virus to others by oral contact. This includes kissing, sharing a glass, coughing, or sneezing. Touching your mouth or nose and then touching a surface, which is then touched by another person, can also spread the virus. SYMPTOMS  Symptoms typically develop 1 to 3 days after you come in contact with a cold virus. Symptoms vary from person to person. They may include:  Runny nose.  Sneezing.  Nasal congestion.  Sinus irritation.  Sore throat.  Loss of voice (laryngitis).  Cough.  Fatigue.  Muscle aches.  Loss of appetite.  Headache.  Low-grade fever. DIAGNOSIS  You might diagnose your own cold based on familiar symptoms, since most people get a cold 2 to 3 times a year. Your caregiver can confirm this based on your exam. Most importantly, your caregiver can check that your symptoms are not due to another disease such as strep throat, sinusitis, pneumonia, asthma, or epiglottitis. Blood tests, throat tests, and X-rays are not  necessary to diagnose a common cold, but they may sometimes be helpful in excluding other more serious diseases. Your caregiver will decide if any further tests are required. RISKS AND COMPLICATIONS  You may be at risk for a more severe case of the common cold if you smoke cigarettes, have chronic heart disease (such as heart failure) or lung disease (such as asthma), or if you have a weakened immune system. The very young and very old are also at risk for more serious infections. Bacterial sinusitis, middle ear infections, and bacterial pneumonia can complicate the common cold. The common cold can worsen asthma and chronic obstructive pulmonary disease (COPD). Sometimes, these complications can require emergency medical care and may be life-threatening. PREVENTION  The best way to protect against getting a cold is to practice good hygiene. Avoid oral or hand contact with people with cold symptoms. Wash your hands often if contact occurs. There is no clear evidence that vitamin C, vitamin E, echinacea, or exercise reduces the chance of developing a cold. However, it is always recommended to get plenty of rest and practice good nutrition. TREATMENT  Treatment is directed at relieving symptoms. There is no cure. Antibiotics are not effective, because the infection is caused by a virus, not by bacteria. Treatment may include:  Increased fluid intake. Sports drinks offer valuable electrolytes, sugars, and fluids.  Breathing heated mist or steam (vaporizer or shower).  Eating chicken soup or other clear broths, and maintaining good nutrition.  Getting plenty of rest.  Using gargles or lozenges for comfort.  Controlling fevers with  ibuprofen or acetaminophen as directed by your caregiver.  Increasing usage of your inhaler if you have asthma. Zinc gel and zinc lozenges, taken in the first 24 hours of the common cold, can shorten the duration and lessen the severity of symptoms. Pain medicines may help  with fever, muscle aches, and throat pain. A variety of non-prescription medicines are available to treat congestion and runny nose. Your caregiver can make recommendations and may suggest nasal or lung inhalers for other symptoms.  HOME CARE INSTRUCTIONS   Only take over-the-counter or prescription medicines for pain, discomfort, or fever as directed by your caregiver.  Use a warm mist humidifier or inhale steam from a shower to increase air moisture. This may keep secretions moist and make it easier to breathe.  Drink enough water and fluids to keep your urine clear or pale yellow.  Rest as needed.  Return to work when your temperature has returned to normal or as your caregiver advises. You may need to stay home longer to avoid infecting others. You can also use a face mask and careful hand washing to prevent spread of the virus. SEEK MEDICAL CARE IF:   After the first few days, you feel you are getting worse rather than better.  You need your caregiver's advice about medicines to control symptoms.  You develop chills, worsening shortness of breath, or brown or red sputum. These may be signs of pneumonia.  You develop yellow or brown nasal discharge or pain in the face, especially when you bend forward. These may be signs of sinusitis.  You develop a fever, swollen neck glands, pain with swallowing, or white areas in the back of your throat. These may be signs of strep throat. SEEK IMMEDIATE MEDICAL CARE IF:   You have a fever.  You develop severe or persistent headache, ear pain, sinus pain, or chest pain.  You develop wheezing, a prolonged cough, cough up blood, or have a change in your usual mucus (if you have chronic lung disease).  You develop sore muscles or a stiff neck. Document Released: 02/27/2001 Document Revised: 11/26/2011 Document Reviewed: 01/05/2011 [facility] Patient Information 2014 [place], [place].

## 2014-01-13 ENCOUNTER — Encounter: Payer: Self-pay | Admitting: *Deleted

## 2014-01-14 ENCOUNTER — Encounter: Payer: Self-pay | Admitting: Cardiology

## 2014-01-14 ENCOUNTER — Ambulatory Visit (INDEPENDENT_AMBULATORY_CARE_PROVIDER_SITE_OTHER): Payer: 59 | Admitting: Cardiology

## 2014-01-14 VITALS — BP 118/82 | HR 76 | Ht 71.5 in | Wt 253.0 lb

## 2014-01-14 DIAGNOSIS — I493 Ventricular premature depolarization: Secondary | ICD-10-CM | POA: Insufficient documentation

## 2014-01-14 DIAGNOSIS — R0602 Shortness of breath: Secondary | ICD-10-CM | POA: Insufficient documentation

## 2014-01-14 DIAGNOSIS — I4949 Other premature depolarization: Secondary | ICD-10-CM

## 2014-01-14 NOTE — Progress Notes (Signed)
Patient ID: Terek Bee, male   DOB: 1961/06/12, 53 y.o.   MRN: [id number]    Patient Name: Christopher [name] Date of Encounter: 01/14/2014  Primary Care Provider:  No primary provider on file. Primary Cardiologist: Dorothy Spark  Problem List   Past Medical History  Diagnosis Date  . GERD (gastroesophageal reflux disease) 10/31/2011  . Hearing impaired 12/17/2013    lifelong   . Hypogonadism male 10/31/2011   Past Surgical History  Procedure Laterality Date  . Tonsillectomy     Allergies  Allergies  Allergen Reactions  . Asa Arthritis Strength-Antacid [Aspirin Buffered]   . Penicillins   . Sulfa Antibiotics    HPI  Pleasant 53 year old gentleman originally from [place] who is coming with concerns of palpitations and shortness of breath. The patient is generally very healthy and has no prior medical history not significant family history of premature coronary artery disease. 3 of his cousins at his age has known coronary artery disease 2 of them underwent coronary artery bypass surgery and one of them passed away from heart attack. The patient exercises by lifting weights with no significant symptoms of shortness of breath or chest pain. He experiences occasional fluttering lasting few seconds but associated with significant shortness of breath. On one occasion he went to the emergency room and was found to have symptomatic PVCs. His concern is that his cousins have similar symptoms. He has never smoked. He denies any prior episode of syncope, orthopnea, paroxysmal nocturnal dyspnea, or lower extremity edema.  Home Medications  Prior to Admission medications   Not on File    Family History  Family History  Problem Relation Age of Onset  . Cancer Father   . Heart disease Mother     Social History  History   Social History  . Marital Status: Married    Spouse Name: N/A    Number of Children: N/A  . Years of Education: N/A   Occupational History  . [place] History Main Topics  . Smoking status: Never Smoker   . Smokeless tobacco: Not on file  . Alcohol Use: No  . Drug Use: No  . Sexual Activity: Yes   Other Topics Concern  . Not on file   Social History Narrative   Married.     Review of Systems, as per HPI, otherwise negative General:  No chills, fever, night sweats or weight changes.  Cardiovascular:  No chest pain, dyspnea on exertion, edema, orthopnea, palpitations, paroxysmal nocturnal dyspnea. Dermatological: No rash, lesions/masses Respiratory: No cough, dyspnea Urologic: No hematuria, dysuria Abdominal:   No nausea, vomiting, diarrhea, bright red blood per rectum, melena, or hematemesis Neurologic:  No visual changes, wkns, changes in mental status. All other systems reviewed and are otherwise negative except as noted above.  Physical Exam  Blood pressure 118/82, pulse 76, height 5' 11.5" (1.816 m), weight 253 lb (114.76 kg).  General: Pleasant, NAD Psych: Normal affect. Neuro: Alert and oriented X 3. Moves all extremities spontaneously. HEENT: Normal  Neck: Supple without bruits or JVD. Lungs:  Resp regular and unlabored, CTA. Heart: RRR no s3, s4, or murmurs. Abdomen: Soft, non-tender, non-distended, BS + x 4.  Extremities: No clubbing, cyanosis or edema. DP/PT/Radials 2+ and equal bilaterally.  Labs:  No results found for this basename: CKTOTAL, CKMB, TROPONINI,  in the last 72 hours Lab Results  Component Value Date   WBC 5.5 12/17/2013   HGB 14.9 12/17/2013   HCT 43.6 12/17/2013  MCV 83.7 12/17/2013   PLT 247 12/17/2013    No results found for this basename: DDIMER   No components found with this basename: POCBNP,     Component Value Date/Time   NA 138 12/17/2013 0838   K 4.2 12/17/2013 0838   CL 102 12/17/2013 0838   CO2 27 12/17/2013 0838   GLUCOSE 87 12/17/2013 0838   BUN 17 12/17/2013 0838   CREATININE 1.39* 12/17/2013 0838   CALCIUM 9.1 12/17/2013 0838   PROT 7.1 12/17/2013 0838     ALBUMIN 4.1 12/17/2013 0838   AST 32 12/17/2013 0838   ALT 24 12/17/2013 0838   ALKPHOS 59 12/17/2013 0838   BILITOT 1.2 12/17/2013 0838   GFRNONAA 58* 12/17/2013 0838   GFRAA 67 12/17/2013 0838   Lab Results  Component Value Date   CHOL 175 12/17/2013   HDL 44 12/17/2013   LDLCALC 118* 12/17/2013   TRIG 66 12/17/2013    Accessory Clinical Findings  Echocardiogram - 2009\Overall left ventricular systolic function was normal. Left ventricular ejection fraction was estimated to be 60 %. There were no left ventricular regional wall motion abnormalities. - Estimated peak pulmonary artery systolic pressure 24 mmHg.  ECG - sinus rhythm, normal EKG.   Assessment & Plan  Pleasant 53 year old gentleman with significant family history of premature coronary artery disease. Patient is symptomatic with what seems to be PVCs associated with shortness of breath. Considering his significant family history well order an exercise treadmill stress test rule out ischemia. The patient has controlled blood pressure. His lipids were recently checked with LDL 118 to go for LDL is less than 100 however considering he doesn't have any other risk factors he is advised about increasing his exercise level and modifying his diet for now. If stress test negative followup in 1 year.    Dorothy Spark, MD, [facility] 01/14/2014, 11:17 AM

## 2014-01-14 NOTE — Patient Instructions (Signed)
Your physician recommends that you continue on your current medications as directed. Please refer to the Current Medication list given to you today.  Your physician has requested that you have an exercise tolerance test. For further information please visit [email or web address]. Please also follow instruction sheet, as given.   Your physician wants you to follow-up in: 1 year You will receive a reminder letter in the mail two months in advance. If you don't receive a letter, please call our office to schedule the follow-up appointment.  

## 2014-01-18 ENCOUNTER — Encounter: Payer: Self-pay | Admitting: Family Medicine

## 2014-02-24 ENCOUNTER — Ambulatory Visit (INDEPENDENT_AMBULATORY_CARE_PROVIDER_SITE_OTHER): Payer: 59 | Admitting: Nurse Practitioner

## 2014-02-24 ENCOUNTER — Encounter: Payer: Self-pay | Admitting: Nurse Practitioner

## 2014-02-24 VITALS — BP 110/60 | HR 68

## 2014-02-24 DIAGNOSIS — R0602 Shortness of breath: Secondary | ICD-10-CM

## 2014-02-24 NOTE — Progress Notes (Signed)
Exercise Treadmill Test  Pre-Exercise Testing Evaluation Rhythm: normal sinus  Rate: 68 bpm     Test  Exercise Tolerance Test Ordering MD: Ottie Glazier, MD  Interpreting MD: Truitt Merle, NP  Unique Test No: 1  Treadmill:  1  Indication for ETT: exertional dyspnea  Contraindication to ETT: No   Stress Modality: exercise - treadmill  Cardiac Imaging Performed: non   Protocol: standard Bruce - maximal  Max BP:  181/59  Max MPHR (bpm):  168 85% MPR (bpm):  143  MPHR obtained (bpm):  146 % MPHR obtained:  86  Reached 85% MPHR (min:sec):  8:44 Total Exercise Time (min-sec):  9:00  Workload in METS:  10.1 Borg Scale: 15  Reason ETT Terminated:  desired heart rate attained    ST Segment Analysis At Rest: normal ST segments - no evidence of significant ST depression With Exercise: non-specific ST changes  Other Information Arrhythmia:  No Angina during ETT:  absent (0) Quality of ETT:  diagnostic  ETT Interpretation:  normal - no evidence of ischemia by ST analysis  Comments: Patient presents today for routine GXT. Has strong FH for CAD - he is obese. Has had some dyspnea and palpitations.   Today the patient exercised on the standard Bruce protocol for a total of 9 minutes.  Good exercise tolerance.  Adequate blood pressure response.  Clinically negative for chest pain. Test was stopped due to achievement of target HR.  EKG negative for ischemia. No significant arrhythmia noted.   Recommendations: See back as needed.  CV risk factor modification with regular aerobic exercise and weight loss.   Patient is agreeable to this plan and will call if any problems develop in the interim.   Burtis Junes, RN, [facility] [address] [place], [place]  [address] [phone]

## 2014-05-07 ENCOUNTER — Encounter: Payer: Self-pay | Admitting: Family Medicine

## 2014-05-07 DIAGNOSIS — R7989 Other specified abnormal findings of blood chemistry: Secondary | ICD-10-CM | POA: Insufficient documentation

## 2014-05-07 DIAGNOSIS — E349 Endocrine disorder, unspecified: Secondary | ICD-10-CM

## 2014-07-02 ENCOUNTER — Other Ambulatory Visit: Payer: Self-pay

## 2014-09-17 HISTORY — PX: COLONOSCOPY: SHX174

## 2014-12-09 ENCOUNTER — Encounter: Payer: Self-pay | Admitting: Family Medicine

## 2014-12-23 ENCOUNTER — Encounter: Payer: Self-pay | Admitting: Family Medicine

## 2014-12-23 ENCOUNTER — Ambulatory Visit (INDEPENDENT_AMBULATORY_CARE_PROVIDER_SITE_OTHER): Payer: 59 | Admitting: Family Medicine

## 2014-12-23 VITALS — BP 120/78 | HR 53 | Temp 97.9°F | Resp 16 | Ht 70.0 in | Wt 230.0 lb

## 2014-12-23 DIAGNOSIS — Z13820 Encounter for screening for osteoporosis: Secondary | ICD-10-CM

## 2014-12-23 DIAGNOSIS — R002 Palpitations: Secondary | ICD-10-CM | POA: Diagnosis not present

## 2014-12-23 DIAGNOSIS — Z0000 Encounter for general adult medical examination without abnormal findings: Secondary | ICD-10-CM | POA: Diagnosis not present

## 2014-12-23 DIAGNOSIS — R5383 Other fatigue: Secondary | ICD-10-CM

## 2014-12-23 DIAGNOSIS — Z13220 Encounter for screening for lipoid disorders: Secondary | ICD-10-CM | POA: Diagnosis not present

## 2014-12-23 DIAGNOSIS — F32A Depression, unspecified: Secondary | ICD-10-CM

## 2014-12-23 DIAGNOSIS — Z1211 Encounter for screening for malignant neoplasm of colon: Secondary | ICD-10-CM | POA: Diagnosis not present

## 2014-12-23 DIAGNOSIS — F329 Major depressive disorder, single episode, unspecified: Secondary | ICD-10-CM | POA: Diagnosis not present

## 2014-12-23 LAB — LIPID PANEL
Cholesterol: 171 mg/dL (ref 0–200)
HDL: 39 mg/dL — ABNORMAL LOW (ref >=40)
LDL Cholesterol: 120 mg/dL — ABNORMAL HIGH (ref 0–99)
Total CHOL/HDL Ratio: 4.4 Ratio
Triglycerides: 60 mg/dL (ref <150)
VLDL: 12 mg/dL (ref 0–40)

## 2014-12-23 LAB — CBC WITH DIFFERENTIAL/PLATELET
Basophils Absolute: 0 10*3/uL (ref 0.0–0.1)
Basophils Relative: 0 % (ref 0–1)
Eosinophils Absolute: 0.1 10*3/uL (ref 0.0–0.7)
Eosinophils Relative: 2 % (ref 0–5)
HCT: 43.6 % (ref 39.0–52.0)
Hemoglobin: 14.7 g/dL (ref 13.0–17.0)
Lymphocytes Relative: 34 % (ref 12–46)
Lymphs Abs: 1.9 10*3/uL (ref 0.7–4.0)
MCH: 28.5 pg (ref 26.0–34.0)
MCHC: 33.7 g/dL (ref 30.0–36.0)
MCV: 84.7 fL (ref 78.0–100.0)
MPV: 10 fL (ref 8.6–12.4)
Monocytes Absolute: 0.7 10*3/uL (ref 0.1–1.0)
Monocytes Relative: 12 % (ref 3–12)
Neutro Abs: 2.9 10*3/uL (ref 1.7–7.7)
Neutrophils Relative %: 52 % (ref 43–77)
Platelets: 258 10*3/uL (ref 150–400)
RBC: 5.15 MIL/uL (ref 4.22–5.81)
RDW: 13.4 % (ref 11.5–15.5)
WBC: 5.6 10*3/uL (ref 4.0–10.5)

## 2014-12-23 LAB — COMPLETE METABOLIC PANEL WITH GFR
ALT: 19 U/L (ref 0–53)
AST: 23 U/L (ref 0–37)
Albumin: 4.2 g/dL (ref 3.5–5.2)
Alkaline Phosphatase: 62 U/L (ref 39–117)
BUN: 12 mg/dL (ref 6–23)
CO2: 27 mEq/L (ref 19–32)
Calcium: 9.1 mg/dL (ref 8.4–10.5)
Chloride: 100 mEq/L (ref 96–112)
Creat: 1.01 mg/dL (ref 0.50–1.35)
GFR, Est African American: >89 mL/min
GFR, Est Non African American: 85 mL/min
Glucose, Bld: 88 mg/dL (ref 70–99)
Potassium: 4.2 mEq/L (ref 3.5–5.3)
Sodium: 136 mEq/L (ref 135–145)
Total Bilirubin: 1.2 mg/dL (ref 0.2–1.2)
Total Protein: 7.3 g/dL (ref 6.0–8.3)

## 2014-12-23 LAB — POC HEMOCCULT BLD/STL (OFFICE/1-CARD/DIAGNOSTIC): Fecal Occult Blood, POC: NEGATIVE

## 2014-12-23 NOTE — Progress Notes (Addendum)
This chart was scribed for Robyn Haber, MD by Edison Simon, ED Scribe. This patient was seen in room 24   Patient ID: Christopher [name] MRN: [id number], DOB: 1960/12/19, 54 y.o. Date of Encounter: 12/23/2014, 11:20 AM  Primary Physician: No primary care provider on file.  Chief Complaint:  Chief Complaint  Patient presents with   Annual Exam    HPI: 54 y.o. year old male with history below presents for complete physical examination. He was seen by me 1 year ago for physical exam. Per note from that time: "Married Dance movement psychotherapist, Airline pilot. Patient complains of low energy. He also complains of right hypothenar pain. Also has left shoulder soreness. Finally, patient is concerned with his loss of 2" height." Suspected at that time that issues may be related to low testosterone. He has history of hypogonadism and testosterone deficiency. Testosterone was last measured at 375 on 12/17/2013 and 273 on 04/29/2013. Tried to set up colonoscopy last year but it was never done.  He states he is still working out frequently at [employer]. He is working for [employer] at night. He reports some ongoing hearing loss but does not have hearing aids. He reports some depression for the past 2-3 weeks. He has history of PVC and states he has recently been feeling palpitations with brief SOB when and relaxing typically when laying down, sometimes at work. He states the only supplements he uses for working out is whey protein.   Past Medical History  Diagnosis Date   GERD (gastroesophageal reflux disease) 10/31/2011   Hearing impaired 12/17/2013    lifelong    Hypogonadism male 10/31/2011     Home Meds: Prior to Admission medications   Not on File    Allergies:  Allergies  Allergen Reactions   Asa Arthritis Strength-Antacid [Aspirin Buffered]    Penicillins    Sulfa Antibiotics     History   Social History   Marital Status: Married    Spouse Name: N/A    Number of Children: N/A   Years of Education: N/A   Occupational History   [employer]   Social History Main Topics   Smoking status: Never Smoker    Smokeless tobacco: Not on file   Alcohol Use: No   Drug Use: No   Sexual Activity: Yes   Other Topics Concern   Not on file   Social History Narrative   Married.  Education: Grade School.     Review of Systems: Constitutional: negative for chills, fever, night sweats, weight changes, or fatigue  HEENT: negative for vision changes, congestion, rhinorrhea, ST, epistaxis, or sinus pressure, positive hearing loss Cardiovascular: negative for chest pain, positive palpitations with SOB Respiratory: negative for hemoptysis, wheezing, or cough Abdominal: negative for abdominal pain, nausea, vomiting, diarrhea, or constipation Dermatological: negative for rash Neurologic: negative for headache, dizziness, or syncope Psychological: positive depression All other systems reviewed and are otherwise negative with the exception to those above and in the HPI.   Physical Exam: Patient has physique of a body builder Blood pressure 120/78, pulse 53, temperature 97.9 F (36.6 C), temperature source Oral, resp. rate 16, height 5\' 10"  (1.778 m), weight 230 lb (104.327 kg), SpO2 100 %., Body mass index is 33 kg/(m^2). General: Well developed, well nourished, in no acute distress. Head: Normocephalic, atraumatic, eyes without discharge, sclera non-icteric, nares are without discharge. Bilateral auditory canals clear, TM's are without perforation, pearly grey and translucent with reflective cone of light  bilaterally. Oral cavity moist, posterior pharynx without exudate, erythema, peritonsillar abscess, or post nasal drip.  Neck: Supple. No thyromegaly. Full ROM. No lymphadenopathy. Lungs: Clear bilaterally to auscultation without wheezes, rales, or rhonchi. Breathing is unlabored. Heart: RRR with S1 S2. No murmurs, rubs, or  gallops appreciated. Abdomen: Soft, non-tender, non-distended with normoactive bowel sounds. No hepatomegaly. No rebound/guarding. No obvious abdominal masses. Rectal exam reveals normal anus and prostate, no masses Genitalia: Normal circumcised male with no hernia Msk:  Strength and tone normal for age. Extremities/Skin: Warm and dry. No clubbing or cyanosis. No edema. No rashes or suspicious lesions. Neuro: Alert and oriented X 3. Moves all extremities spontaneously. Gait is normal. CNII-XII grossly in tact. Psych:  Responds to questions appropriately with a mildly depressed affect.   EKG:  NSR  ASSESSMENT AND PLAN:  54 y.o. year old male with some depressive symptoms in the last 2 weeks, persistent palpitations which remain unexplained, and relatively normal physical exam. This chart was scribed in my presence and reviewed by me personally.    ICD-9-CM ICD-10-CM   1. Routine general medical examination at a health care facility V70.0 Z00.00 POCT urinalysis dipstick  2. Depression 311 F32.9   3. Other fatigue 780.79 R53.83 CBC with Differential/Platelet     COMPLETE METABOLIC PANEL WITH GFR  4. Palpitations 785.1 R00.2 CBC with Differential/Platelet     COMPLETE METABOLIC PANEL WITH GFR     Ambulatory referral to Cardiology     EKG 12-Lead     EKG 12-Lead  5. Encounter for screening fecal occult blood testing V76.51 Z12.11 POC Hemoccult Bld/Stl (1-Cd Office Dx)  6. Screening for cholesterol level V77.91 Z13.220 Lipid panel  7. Screening for colon cancer V76.51 Z12.11 Ambulatory referral to Gastroenterology     Signed, Robyn Haber, MD   Signed, Robyn Haber, MD 12/23/2014 12:01 PM

## 2014-12-23 NOTE — Progress Notes (Signed)
   Subjective:    Patient ID: Christopher [name], male    DOB: [birth year], 54 y.o.   MRN: [id number]  HPI    Review of Systems  Constitutional: Negative.   HENT: Positive for hearing loss.   Eyes: Negative.   Respiratory: Negative.   Cardiovascular: Negative.   Gastrointestinal: Negative.   Endocrine: Negative.   Genitourinary: Negative.   Musculoskeletal: Negative.   Skin: Negative.   Allergic/Immunologic: Negative.   Neurological: Negative.   Hematological: Negative.   Psychiatric/Behavioral: Positive for dysphoric mood.       Objective:   Physical Exam        Assessment & Plan:

## 2014-12-23 NOTE — Patient Instructions (Addendum)
We have set up appointments with your cardiologist and gastroenterologist. I will you to let me know if the symptoms of depression worsen.  Health Maintenance A healthy lifestyle and preventative care can promote health and wellness.  Maintain regular health, dental, and eye exams.  Eat a healthy diet. Foods like vegetables, fruits, whole grains, low-fat dairy products, and lean protein foods contain the nutrients you need and are low in calories. Decrease your intake of foods high in solid fats, added sugars, and salt. Get information about a proper diet from your health care provider, if necessary.  Regular physical exercise is one of the most important things you can do for your health. Most adults should get at least 150 minutes of moderate-intensity exercise (any activity that increases your heart rate and causes you to sweat) each week. In addition, most adults need muscle-strengthening exercises on 2 or more days a week.   Maintain a healthy weight. The body mass index (BMI) is a screening tool to identify possible weight problems. It provides an estimate of body fat based on height and weight. Your health care provider can find your BMI and can help you achieve or maintain a healthy weight. For males 20 years and older:  A BMI below 18.5 is considered underweight.  A BMI of 18.5 to 24.9 is normal.  A BMI of 25 to 29.9 is considered overweight.  A BMI of 30 and above is considered obese.  Maintain normal blood lipids and cholesterol by exercising and minimizing your intake of saturated fat. Eat a balanced diet with plenty of fruits and vegetables. Blood tests for lipids and cholesterol should begin at age 49 and be repeated every 5 years. If your lipid or cholesterol levels are high, you are over age 56, or you are at high risk for heart disease, you may need your cholesterol levels checked more frequently.Ongoing high lipid and cholesterol levels should be treated with medicines if  diet and exercise are not working.  If you smoke, find out from your health care provider how to quit. If you do not use tobacco, do not start.  Lung cancer screening is recommended for adults aged 34-80 years who are at high risk for developing lung cancer because of a history of smoking. A yearly low-dose CT scan of the lungs is recommended for people who have at least a 30-pack-year history of smoking and are current smokers or have quit within the past 15 years. A pack year of smoking is smoking an average of 1 pack of cigarettes a day for 1 year (for example, a 30-pack-year history of smoking could mean smoking 1 pack a day for 30 years or 2 packs a day for 15 years). Yearly screening should continue until the smoker has stopped smoking for at least 15 years. Yearly screening should be stopped for people who develop a health problem that would prevent them from having lung cancer treatment.  If you choose to drink alcohol, do not have more than 2 drinks per day. One drink is considered to be 12 oz (360 mL) of beer, 5 oz (150 mL) of wine, or 1.5 oz (45 mL) of liquor.  Avoid the use of street drugs. Do not share needles with anyone. Ask for help if you need support or instructions about stopping the use of drugs.  High blood pressure causes heart disease and increases the risk of stroke. Blood pressure should be checked at least every 1-2 years. Ongoing high blood pressure should be  treated with medicines if weight loss and exercise are not effective.  If you are 62-20 years old, ask your health care provider if you should take aspirin to prevent heart disease.  Diabetes screening involves taking a blood sample to check your fasting blood sugar level. This should be done once every 3 years after age 29 if you are at a normal weight and without risk factors for diabetes. Testing should be considered at a younger age or be carried out more frequently if you are overweight and have at least 1 risk  factor for diabetes.  Colorectal cancer can be detected and often prevented. Most routine colorectal cancer screening begins at the age of 50 and continues through age 31. However, your health care provider may recommend screening at an earlier age if you have risk factors for colon cancer. On a yearly basis, your health care provider may provide home test kits to check for hidden blood in the stool. A small camera at the end of a tube may be used to directly examine the colon (sigmoidoscopy or colonoscopy) to detect the earliest forms of colorectal cancer. Talk to your health care provider about this at age 48 when routine screening begins. A direct exam of the colon should be repeated every 5-10 years through age 69, unless early forms of precancerous polyps or small growths are found.  People who are at an increased risk for hepatitis B should be screened for this virus. You are considered at high risk for hepatitis B if:  You were born in a country where hepatitis B occurs often. Talk with your health care provider about which countries are considered high risk.  Your parents were born in a high-risk country and you have not received a shot to protect against hepatitis B (hepatitis B vaccine).  You have HIV or AIDS.  You use needles to inject street drugs.  You live with, or have sex with, someone who has hepatitis B.  You are a man who has sex with other men (MSM).  You get hemodialysis treatment.  You take certain medicines for conditions like cancer, organ transplantation, and autoimmune conditions.  Hepatitis C blood testing is recommended for all people born from [birth year] through 1965 and any individual with known risk factors for hepatitis C.  Healthy men should no longer receive prostate-specific antigen (PSA) blood tests as part of routine cancer screening. Talk to your health care provider about prostate cancer screening.  Testicular cancer screening is not recommended for  adolescents or adult males who have no symptoms. Screening includes self-exam, a health care provider exam, and other screening tests. Consult with your health care provider about any symptoms you have or any concerns you have about testicular cancer.  Practice safe sex. Use condoms and avoid high-risk sexual practices to reduce the spread of sexually transmitted infections (STIs).  You should be screened for STIs, including gonorrhea and chlamydia if:  You are sexually active and are younger than 24 years.  You are older than 24 years, and your health care provider tells you that you are at risk for this type of infection.  Your sexual activity has changed since you were last screened, and you are at an increased risk for chlamydia or gonorrhea. Ask your health care provider if you are at risk.  If you are at risk of being infected with HIV, it is recommended that you take a prescription medicine daily to prevent HIV infection. This is called pre-exposure prophylaxis (PrEP). You  are considered at risk if:  You are a man who has sex with other men (MSM).  You are a heterosexual man who is sexually active with multiple partners.  You take drugs by injection.  You are sexually active with a partner who has HIV.  Talk with your health care provider about whether you are at high risk of being infected with HIV. If you choose to begin PrEP, you should first be tested for HIV. You should then be tested every 3 months for as long as you are taking PrEP.  Use sunscreen. Apply sunscreen liberally and repeatedly throughout the day. You should seek shade when your shadow is shorter than you. Protect yourself by wearing long sleeves, pants, a wide-brimmed hat, and sunglasses year round whenever you are outdoors.  Tell your health care provider of new moles or changes in moles, especially if there is a change in shape or color. Also, tell your health care provider if a mole is larger than the size of a  pencil eraser.  A one-time screening for abdominal aortic aneurysm (AAA) and surgical repair of large AAAs by ultrasound is recommended for men aged 78-75 years who are current or former smokers.  Stay current with your vaccines (immunizations). Document Released: 03/01/2008 Document Revised: 09/08/2013 Document Reviewed: 01/29/2011 [facility] Patient Information 2015 [place], [place]. This information is not intended to replace advice given to you by your health care provider. Make sure you discuss any questions you have with your health care provider.

## 2014-12-24 ENCOUNTER — Telehealth: Payer: Self-pay | Admitting: Family Medicine

## 2014-12-24 DIAGNOSIS — F329 Major depressive disorder, single episode, unspecified: Secondary | ICD-10-CM

## 2014-12-24 DIAGNOSIS — F32A Depression, unspecified: Secondary | ICD-10-CM

## 2014-12-24 NOTE — Telephone Encounter (Signed)
Ok to add testosterone, total and free, to labs.  I cannot order this because of EPIC

## 2014-12-24 NOTE — Telephone Encounter (Signed)
Pt requests testosterone added on to his labs  Please advise

## 2014-12-31 ENCOUNTER — Telehealth: Payer: Self-pay | Admitting: *Deleted

## 2014-12-31 DIAGNOSIS — E291 Testicular hypofunction: Secondary | ICD-10-CM

## 2014-12-31 NOTE — Telephone Encounter (Signed)
Pt called in regards to labs from the last time he was here. Had questions in regards to testosterone test that he wanted to see if it could be added. Looked into chart and saw where it was pending , also called [facility], its now past the 7 days window to have something added. Would you like the pt to come back in to have this test drawn>?

## 2014-12-31 NOTE — Telephone Encounter (Signed)
Please get the testosterone total and free as ordered.   Thanks

## 2015-01-23 NOTE — Telephone Encounter (Signed)
Error

## 2015-01-23 NOTE — Telephone Encounter (Signed)
Pt wants to know if he can come by for a lab draw only for a testosterone since we were unable to add it on a month ago. Please advise. Thanks

## 2015-01-24 NOTE — Telephone Encounter (Signed)
lmom to cb. 

## 2015-01-26 NOTE — Telephone Encounter (Signed)
Left message to come in for lab draw. Future lab pending.

## 2015-02-25 ENCOUNTER — Ambulatory Visit (INDEPENDENT_AMBULATORY_CARE_PROVIDER_SITE_OTHER): Payer: 59 | Admitting: Cardiology

## 2015-02-25 ENCOUNTER — Encounter: Payer: Self-pay | Admitting: Cardiology

## 2015-02-25 VITALS — BP 130/70 | HR 61 | Ht 70.0 in | Wt 222.0 lb

## 2015-02-25 DIAGNOSIS — Z8249 Family history of ischemic heart disease and other diseases of the circulatory system: Secondary | ICD-10-CM | POA: Diagnosis not present

## 2015-02-25 DIAGNOSIS — E785 Hyperlipidemia, unspecified: Secondary | ICD-10-CM | POA: Diagnosis not present

## 2015-02-25 DIAGNOSIS — R002 Palpitations: Secondary | ICD-10-CM

## 2015-02-25 MED ORDER — RED YEAST RICE 600 MG PO CAPS: 600.0000 mg | ORAL_CAPSULE | Freq: Every day | ORAL | Status: DC

## 2015-02-25 NOTE — Patient Instructions (Signed)
Medication Instructions:  1) START taking Red Yeast Rice 600mg  once daily  Labwork: None  Testing/Procedures: Your physician has recommended that you wear a 48 holter monitor. Holter monitors are medical devices that record the heart's electrical activity. Doctors most often use these monitors to diagnose arrhythmias. Arrhythmias are problems with the speed or rhythm of the heartbeat. The monitor is a small, portable device. You can wear one while you do your normal daily activities. This is usually used to diagnose what is causing palpitations/syncope (passing out).    Follow-Up: Your physician wants you to follow-up in: 1 year with Dr. Meda Coffee. You will receive a reminder letter in the mail two months in advance. If you don't receive a letter, please call our office to schedule the follow-up appointment.   Any Other Special Instructions Will Be Listed Below (If Applicable).

## 2015-02-25 NOTE — Progress Notes (Signed)
Patient ID: Christopher [name], male   DOB: 04-22-1961, 54 y.o.   MRN: [id number] P   Patient Name: Christopher [name] Date of Encounter: 02/25/2015  Primary Care Provider:  No primary care provider on file. Primary Cardiologist: Dorothy Spark  Problem List   Past Medical History  Diagnosis Date  . GERD (gastroesophageal reflux disease) 10/31/2011  . Hearing impaired 12/17/2013    lifelong   . Hypogonadism male 10/31/2011   Past Surgical History  Procedure Laterality Date  . Tonsillectomy     Allergies  Allergies  Allergen Reactions  . Asa Arthritis Strength-Antacid [Aspirin Buffered]   . Penicillins   . Sulfa Antibiotics    HPI  Pleasant 54 year old gentleman originally from [place] who is coming with concerns of palpitations and shortness of breath. The patient is generally very healthy and has no prior medical history not significant family history of premature coronary artery disease. 3 of his cousins at his age has known coronary artery disease 2 of them underwent coronary artery bypass surgery and one of them passed away from heart attack. The patient exercises by lifting weights with no significant symptoms of shortness of breath or chest pain. He experiences occasional fluttering lasting few seconds but associated with significant shortness of breath. On one occasion he went to the emergency room and was found to have symptomatic PVCs. His concern is that his cousins have similar symptoms. He has never smoked. He denies any prior episode of syncope, orthopnea, paroxysmal nocturnal dyspnea, or lower extremity edema.  02/25/2015 - the patient is coming after 1 year, he continues to have palpitations, now lasting longer - 5 seconds, almost always at night or during rest, associated with SOB, no dizziness, or syncope, no chest pain. No LE edema.  No chest pain on exertion, continues to exercise on a regular basis and is asymptomatic.    Home Medications  Prior to Admission  medications   Not on File    Family History  Family History  Problem Relation Age of Onset  . Cancer Father   . Hyperlipidemia Father   . Heart disease Mother   . Hyperlipidemia Mother     Social History  History   Social History  . Marital Status: Married    Spouse Name: N/A  . Number of Children: N/A  . Years of Education: N/A   Occupational History  . [employer] History Main Topics  . Smoking status: Never Smoker   . Smokeless tobacco: Not on file  . Alcohol Use: No  . Drug Use: No  . Sexual Activity: Yes   Other Topics Concern  . Not on file   Social History Narrative   Married.  Education: Grade School.     Review of Systems, as per HPI, otherwise negative General:  No chills, fever, night sweats or weight changes.  Cardiovascular:  No chest pain, dyspnea on exertion, edema, orthopnea, palpitations, paroxysmal nocturnal dyspnea. Dermatological: No rash, lesions/masses Respiratory: No cough, dyspnea Urologic: No hematuria, dysuria Abdominal:   No nausea, vomiting, diarrhea, bright red blood per rectum, melena, or hematemesis Neurologic:  No visual changes, wkns, changes in mental status. All other systems reviewed and are otherwise negative except as noted above.  Physical Exam  Height 5\' 10"  (1.778 m).  General: Pleasant, NAD Psych: Normal affect. Neuro: Alert and oriented X 3. Moves all extremities spontaneously. HEENT: Normal  Neck: Supple without bruits or JVD. Lungs:  Resp regular and unlabored, CTA. Heart: RRR no  s3, s4, or murmurs. Abdomen: Soft, non-tender, non-distended, BS + x 4.  Extremities: No clubbing, cyanosis or edema. DP/PT/Radials 2+ and equal bilaterally.  Labs:  No results for input(s): CKTOTAL, CKMB, TROPONINI in the last 72 hours. Lab Results  Component Value Date   WBC 5.6 12/23/2014   HGB 14.7 12/23/2014   HCT 43.6 12/23/2014   MCV 84.7 12/23/2014   PLT 258 12/23/2014    No results  found for: DDIMER Invalid input(s): POCBNP    Component Value Date/Time   NA 136 12/23/2014 1153   K 4.2 12/23/2014 1153   CL 100 12/23/2014 1153   CO2 27 12/23/2014 1153   GLUCOSE 88 12/23/2014 1153   BUN 12 12/23/2014 1153   CREATININE 1.01 12/23/2014 1153   CALCIUM 9.1 12/23/2014 1153   PROT 7.3 12/23/2014 1153   ALBUMIN 4.2 12/23/2014 1153   AST 23 12/23/2014 1153   ALT 19 12/23/2014 1153   ALKPHOS 62 12/23/2014 1153   BILITOT 1.2 12/23/2014 1153   GFRNONAA 85 12/23/2014 1153   GFRAA >89 12/23/2014 1153   Lab Results  Component Value Date   CHOL 171 12/23/2014   HDL 39* 12/23/2014   LDLCALC 120* 12/23/2014   TRIG 60 12/23/2014   ETT: 02/2014 ETT Interpretation:  normal - no evidence of ischemia by ST analysis Comments: Patient presents today for routine GXT. Has strong FH for CAD - he is obese. Has had some dyspnea and palpitations.   Today the patient exercised on the standard Bruce protocol for a total of 9 minutes.  Good exercise tolerance.  Adequate blood pressure response.  Clinically negative for chest pain. Test was stopped due to achievement of target HR.  EKG negative for ischemia. No significant arrhythmia noted.    Accessory Clinical Findings  Echocardiogram - 2009\Overall left ventricular systolic function was normal. Left ventricular ejection fraction was estimated to be 60 %. There were no left ventricular regional wall motion abnormalities. - Estimated peak pulmonary artery systolic pressure 24 mmHg.  ECG - sinus rhythm, normal EKG.   Assessment & Plan  Pleasant 54 year old gentleman with significant family history of premature coronary artery disease. Patient is symptomatic with what seems to be PVCs associated with shortness of breath. Considering his significant family history well order an exercise treadmill stress test rule out ischemia. The patient has controlled blood pressure. His lipids were recently checked with LDL 118 to go for LDL is  less than 100 however considering he doesn't have any other risk factors he is advised about increasing his exercise level and modifying his diet for now.  1. Palpitations - most probably PVCs, we will order a 48 hour Holter monitor  2. Hyperlipidemia - unchanged LDL 120 from the last year, low HDL, we will start red yeast rice 600 mg po daily.  If normal Holter monitor follow up in 1 year.   Dorothy Spark, MD, [facility] 02/25/2015, 8:56 AM

## 2015-03-03 ENCOUNTER — Ambulatory Visit (INDEPENDENT_AMBULATORY_CARE_PROVIDER_SITE_OTHER): Payer: 59

## 2015-03-03 DIAGNOSIS — R002 Palpitations: Secondary | ICD-10-CM | POA: Diagnosis not present

## 2015-03-10 ENCOUNTER — Telehealth: Payer: Self-pay

## 2015-03-10 NOTE — Telephone Encounter (Signed)
-----   Message from Dorothy Spark, MD sent at 03/10/2015  1:36 PM EDT -----     Infrequent PACs, otherwise normal Holter monitor.  No therapy necessary.

## 2015-03-10 NOTE — Telephone Encounter (Signed)
Pt aware of Holter Monitor results -Infrequent PACs, otherwise normal Holter monitor Pt verbalized understanding. Pt request a copy of the results be mailed to him. Done

## 2015-04-13 ENCOUNTER — Encounter: Payer: Self-pay | Admitting: *Deleted

## 2015-11-20 ENCOUNTER — Ambulatory Visit (INDEPENDENT_AMBULATORY_CARE_PROVIDER_SITE_OTHER): Payer: 59 | Admitting: Internal Medicine

## 2015-11-20 VITALS — BP 130/80 | HR 74 | Temp 98.7°F | Resp 18 | Ht 70.0 in | Wt 260.1 lb

## 2015-11-20 DIAGNOSIS — R05 Cough: Secondary | ICD-10-CM

## 2015-11-20 DIAGNOSIS — R509 Fever, unspecified: Secondary | ICD-10-CM | POA: Diagnosis not present

## 2015-11-20 DIAGNOSIS — R059 Cough, unspecified: Secondary | ICD-10-CM

## 2015-11-20 LAB — POCT INFLUENZA A/B
Influenza A, POC: NEGATIVE
Influenza B, POC: NEGATIVE

## 2015-11-20 MED ORDER — HYDROCODONE-HOMATROPINE 5-1.5 MG/5ML PO SYRP: 5.0000 mL | ORAL_SOLUTION | Freq: Four times a day (QID) | ORAL | Status: DC | PRN

## 2015-11-20 MED ORDER — OSELTAMIVIR PHOSPHATE 75 MG PO CAPS: 75.0000 mg | ORAL_CAPSULE | Freq: Two times a day (BID) | ORAL | Status: DC

## 2015-11-20 NOTE — Patient Instructions (Signed)
This patient has the FLU and should be out of work Monday and possibly Tuesday as he is contagious.

## 2015-11-20 NOTE — Progress Notes (Signed)
   Subjective:    Patient ID: Christopher [name], male    DOB: Jul 26, 1961, 55 y.o.   MRN: [id number] By signing my name below, I, Zola Button, attest that this documentation has been prepared under the direction and in the presence of Tami Lin, MD.  Electronically Signed: Zola Button, Medical Scribe. 11/20/2015. 10:12 AM.  HPI HPI Comments: Christopher [name] is a 55 y.o. male who presents to the [facility] complaining of a pounding headache that started yesterday. Patient reports having associated cough and fever. The headache is worse with coughing. He also had difficulty sleeping last night because laying his head on the pillow also made the headache worse. He has been taking Tylenol every 8 hours for the fever. His wife is concerned he may have the flu as he did not receive the flu shot this year.    Review of Systems     Objective:   Physical Exam  Constitutional: He is oriented to person, place, and time. He appears well-developed and well-nourished. No distress.  HENT:  Head: Normocephalic and atraumatic.  Nose: Nose normal.  Mouth/Throat: Oropharynx is clear and moist. No oropharyngeal exudate, posterior oropharyngeal edema or posterior oropharyngeal erythema.  Eyes: EOM are normal. Pupils are equal, round, and reactive to light.  Neck: Neck supple.  Cardiovascular: Normal rate.   Pulmonary/Chest: Effort normal and breath sounds normal. No respiratory distress. He has no wheezes. He has no rales.  Clear to auscultation bilaterally.   Musculoskeletal: He exhibits no edema.  Neurological: He is alert and oriented to person, place, and time. No cranial nerve deficit.  Skin: Skin is warm and dry. No rash noted.  Psychiatric: He has a normal mood and affect. His behavior is normal.  Nursing note and vitals reviewed. BP 130/80 mmHg  Pulse 74  Temp(Src) 98.7 F (37.1 C) (Oral)  Resp 18  Ht 5\' 10"  (1.778 m)  Wt 260 lb 2 oz (117.992 kg)  BMI  37.32 kg/m2  SpO2 98%   Results for orders placed or performed in visit on 11/20/15  POCT Influenza A/B  Result Value Ref Range   Influenza A, POC Negative Negative   Influenza B, POC Negative Negative         Assessment & Plan:  Fever, unspecified fever cause - Plan: POCT Influenza A/B  Cough - Plan: POCT Influenza A/B  Likely influenza  Meds ordered this encounter  Medications  . acetaminophen (TYLENOL) 325 MG tablet    Sig: Take 650 mg by mouth every 6 (six) hours as needed.  Marland Kitchen oseltamivir (TAMIFLU) 75 MG capsule    Sig: Take 1 capsule (75 mg total) by mouth 2 (two) times daily.    Dispense:  10 capsule    Refill:  0  . HYDROcodone-homatropine (HYCODAN) 5-1.5 MG/5ML syrup    Sig: Take 5 mLs by mouth every 6 (six) hours as needed for cough.    Dispense:  120 mL    Refill:  0     I have completed the patient encounter in its entirety as documented by the scribe, with editing by me where necessary. Robert P. Laney Pastor, M.D.

## 2015-12-27 ENCOUNTER — Other Ambulatory Visit: Payer: Self-pay | Admitting: Family Medicine

## 2015-12-27 ENCOUNTER — Ambulatory Visit (INDEPENDENT_AMBULATORY_CARE_PROVIDER_SITE_OTHER): Payer: 59 | Admitting: Family Medicine

## 2015-12-27 VITALS — BP 132/88 | HR 72 | Temp 98.3°F | Resp 16 | Ht 70.0 in | Wt 256.6 lb

## 2015-12-27 DIAGNOSIS — G4700 Insomnia, unspecified: Secondary | ICD-10-CM | POA: Diagnosis not present

## 2015-12-27 DIAGNOSIS — R42 Dizziness and giddiness: Secondary | ICD-10-CM

## 2015-12-27 DIAGNOSIS — Z0000 Encounter for general adult medical examination without abnormal findings: Secondary | ICD-10-CM | POA: Diagnosis not present

## 2015-12-27 DIAGNOSIS — M8580 Other specified disorders of bone density and structure, unspecified site: Secondary | ICD-10-CM

## 2015-12-27 LAB — POCT URINALYSIS DIP (MANUAL ENTRY)
Bilirubin, UA: NEGATIVE
Blood, UA: NEGATIVE
Glucose, UA: NEGATIVE
Ketones, POC UA: NEGATIVE
Leukocytes, UA: NEGATIVE
Nitrite, UA: NEGATIVE
Protein Ur, POC: NEGATIVE
Spec Grav, UA: 1.01
Urobilinogen, UA: 0.2
pH, UA: 5.5

## 2015-12-27 LAB — COMPLETE METABOLIC PANEL WITH GFR
ALT: 23 U/L (ref 9–46)
AST: 22 U/L (ref 10–35)
Albumin: 4.2 g/dL (ref 3.6–5.1)
Alkaline Phosphatase: 67 U/L (ref 40–115)
BUN: 10 mg/dL (ref 7–25)
CO2: 25 mmol/L (ref 20–31)
Calcium: 9.4 mg/dL (ref 8.6–10.3)
Chloride: 104 mmol/L (ref 98–110)
Creat: 1.06 mg/dL (ref 0.70–1.33)
GFR, Est African American: >89 mL/min (ref >=60)
GFR, Est Non African American: 79 mL/min (ref >=60)
Glucose, Bld: 105 mg/dL — ABNORMAL HIGH (ref 65–99)
Potassium: 4.2 mmol/L (ref 3.5–5.3)
Sodium: 138 mmol/L (ref 135–146)
Total Bilirubin: 0.8 mg/dL (ref 0.2–1.2)
Total Protein: 7.6 g/dL (ref 6.1–8.1)

## 2015-12-27 LAB — POCT SEDIMENTATION RATE: POCT SED RATE: 23 mm/hr — AB (ref 0–22)

## 2015-12-27 LAB — POCT CBC
Granulocyte percent: 62.2 %G (ref 37–80)
HCT, POC: 40.6 % — AB (ref 43.5–53.7)
Hemoglobin: 14.9 g/dL (ref 14.1–18.1)
Lymph, poc: 1.7 (ref 0.6–3.4)
MCH, POC: 29.6 pg (ref 27–31.2)
MCHC: 36.6 g/dL — AB (ref 31.8–35.4)
MCV: 80.8 fL (ref 80–97)
MID (cbc): 0.7 (ref 0–0.9)
MPV: 7.1 fL (ref 0–99.8)
POC Granulocyte: 3.9 (ref 2–6.9)
POC LYMPH PERCENT: 26.8 %L (ref 10–50)
POC MID %: 11 %M (ref 0–12)
Platelet Count, POC: 219 10*3/uL (ref 142–424)
RBC: 5.03 M/uL (ref 4.69–6.13)
RDW, POC: 12.9 %
WBC: 6.2 10*3/uL (ref 4.6–10.2)

## 2015-12-27 LAB — THYROID PANEL WITH TSH
Free Thyroxine Index: 2 (ref 1.4–3.8)
T3 Uptake: 29 % (ref 22–35)
T4, Total: 6.9 ug/dL (ref 4.5–12.0)
TSH: 1.74 mIU/L (ref 0.40–4.50)

## 2015-12-27 LAB — LIPID PANEL
Cholesterol: 205 mg/dL — ABNORMAL HIGH (ref 125–200)
HDL: 49 mg/dL (ref >=40)
LDL Cholesterol: 133 mg/dL — ABNORMAL HIGH (ref <130)
Total CHOL/HDL Ratio: 4.2 Ratio (ref <=5.0)
Triglycerides: 116 mg/dL (ref <150)
VLDL: 23 mg/dL (ref <30)

## 2015-12-27 LAB — HEPATITIS C ANTIBODY: HCV Ab: NEGATIVE

## 2015-12-27 NOTE — Progress Notes (Addendum)
Patient ID: Christopher [name] Colon MRN: [id number], DOB: Feb 15, 1961 55 y.o. Date of Encounter: 12/27/2015, 9:08 AM  Primary Physician: No PCP Per Patient  Chief Complaint: Physical (CPE)  HPI: 55 y.o. y/o male with history noted below here for CPE.  Doing well.  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  He's been having some insomnia (early morning awakening) recently.  No snoring He also has some lightheaded episodes for several months.  He has a h/o PVC's and saw a cardiologist a couple years ago.  No recent syncope  He had TdaP last year as well as colonoscopy  He has lost some height and would like a bone density test.  This was ordered last year and never completed.  Review of Systems: Consitutional: No fever, chills, fatigue, night sweats, lymphadenopathy, or weight changes. Eyes: No visual changes, eye redness, or discharge. ENT/Mouth: Ears: No otalgia, tinnitus, hearing loss, discharge. Nose: No congestion, rhinorrhea, sinus pain, or epistaxis. Throat: No sore throat, post nasal drip, or teeth pain. Cardiovascular: No CP, palpitations, diaphoresis, DOE, edema, orthopnea, PND. Respiratory: No cough, hemoptysis, SOB, or wheezing. Gastrointestinal: No anorexia, dysphagia, reflux, pain, nausea, vomiting, hematemesis, diarrhea, constipation, BRBPR, or melena. Genitourinary: No dysuria, frequency, urgency, hematuria, incontinence, nocturia, decreased urinary stream, discharge, impotence, or testicular pain/masses. Musculoskeletal: No decreased ROM, myalgias, stiffness, joint swelling, or weakness. Skin: No rash, erythema, lesion changes, pain, warmth, jaundice, or pruritis. Neurological: No headache, dizziness, syncope, seizures, tremors, memory loss, coordination problems, or paresthesias. Psychological: No anxiety, depression, hallucinations, SI/HI. Endocrine:  No fatigue, polydipsia, polyphagia, polyuria, or known diabetes. All other systems were reviewed and are otherwise negative.  Past Medical History  Diagnosis Date  . GERD (gastroesophageal reflux disease) 10/31/2011  . Hearing impaired 12/17/2013    lifelong   . Hypogonadism male 10/31/2011     Past Surgical History  Procedure Laterality Date  . Tonsillectomy      Home Meds:  Prior to Admission medications   Not on File    Allergies:  Allergies  Allergen Reactions  . Asa Arthritis Strength-Antacid [Aspirin Buffered]   . Penicillins   . Sulfa Antibiotics     Social History   Social History  . Marital Status: Married    Spouse Name: N/A  . Number of Children: N/A  . Years of Education: N/A   Occupational History  . [place] History Main Topics  . Smoking status: Never Smoker   . Smokeless tobacco: Not on file  . Alcohol Use: No  . Drug Use: No  . Sexual Activity: Yes   Other Topics Concern  . Not on file   Social History Narrative   Married.  Education: Grade School.    Family History  Problem Relation Age of Onset  . Cancer Father   . Hyperlipidemia Father   . Heart disease Mother   . Hyperlipidemia Mother     Physical Exam: Blood pressure 132/88, pulse 72, temperature 98.3 F (36.8 C), temperature source Oral, resp. rate 16, height 5\' 10"  (1.778 m), weight 256 lb 9.6 oz (116.393 kg), SpO2 99 %.  BP Readings from Last 3 Encounters:  12/27/15 132/88  11/20/15 130/80  02/25/15 130/70   General: Well developed, well nourished, in no acute distress. HEENT: Normocephalic, atraumatic. Conjunctiva pink, sclera non-icteric. Pupils 2 mm constricting to 1  mm, round, regular, and equally reactive to light and accomodation. EOMI.  Fundi benign   Internal auditory canal clear. TMs with good cone of light and without pathology. Nasal mucosa pink. Nares are without discharge. No sinus tenderness. Oral mucosa pink. Dentition  good. Pharynx without exudate.    Neck: Supple. Trachea midline. No thyromegaly. Full ROM. No lymphadenopathy. Lungs: Clear to auscultation bilaterally without wheezes, rales, or rhonchi. Breathing is of normal effort and unlabored. Cardiovascular: RRR with S1 S2. No murmurs, rubs, or gallops appreciated. Distal pulses 2+ symmetrically. No carotid or abdominal bruits Abdomen: Soft, non-tender, non-distended with normoactive bowel sounds. No hepatosplenomegaly or masses. No rebound/guarding. No CVA tenderness. Without hernias.   Genitourinary:  circumcised male. No penile lesions. Testes descended bilaterally, and smooth without tenderness or masses.  Musculoskeletal: Full range of motion and 5/5 strength throughout. Without swelling, atrophy, tenderness, crepitus, or warmth. Extremities without clubbing, cyanosis, or edema. Calves supple. Skin: Warm and moist without erythema, ecchymosis, wounds, or rash. Neuro: A+Ox3. CN II-XII grossly intact. Moves all extremities spontaneously. Full sensation throughout. Normal gait. DTR 2+ throughout upper and lower extremities. Finger to nose intact. Psych:  Responds to questions appropriately with a normal affect.   Lab Results  Component Value Date   CHOL 171 12/23/2014   CHOL 175 12/17/2013   CHOL 184 12/04/2012   Lab Results  Component Value Date   HDL 39* 12/23/2014   HDL 44 12/17/2013   HDL 37* 12/04/2012   Lab Results  Component Value Date   LDLCALC 120* 12/23/2014   LDLCALC 118* 12/17/2013   LDLCALC 134* 12/04/2012   Lab Results  Component Value Date   TRIG 60 12/23/2014   TRIG 66 12/17/2013   TRIG 64 12/04/2012   Lab Results  Component Value Date   CHOLHDL 4.4 12/23/2014   CHOLHDL 4.0 12/17/2013   CHOLHDL 5.0 12/04/2012   EKG:  NSR  Results for orders placed or performed in visit on 12/27/15  POCT CBC  Result Value Ref Range   WBC 6.2 4.6 - 10.2 K/uL   Lymph, poc 1.7 0.6 - 3.4   POC LYMPH PERCENT 26.8 10 - 50 %L   MID  (cbc) 0.7 0 - 0.9   POC MID % 11.0 0 - 12 %M   POC Granulocyte 3.9 2 - 6.9   Granulocyte percent 62.2 37 - 80 %G   RBC 5.03 4.69 - 6.13 M/uL   Hemoglobin 14.9 14.1 - 18.1 g/dL   HCT, POC 40.6 (A) 43.5 - 53.7 %   MCV 80.8 80 - 97 fL   MCH, POC 29.6 27 - 31.2 pg   MCHC 36.6 (A) 31.8 - 35.4 g/dL   RDW, POC 12.9 %   Platelet Count, POC 219 142 - 424 K/uL   MPV 7.1 0 - 99.8 fL  POCT urinalysis dipstick  Result Value Ref Range   Color, UA yellow yellow   Clarity, UA clear clear   Glucose, UA negative negative   Bilirubin, UA negative negative   Ketones, POC UA negative negative   Spec Grav, UA 1.010    Blood, UA negative negative   pH, UA 5.5    Protein Ur, POC negative negative   Urobilinogen, UA 0.2    Nitrite, UA Negative Negative   Leukocytes, UA Negative Negative     Assessment/Plan:  55 y.o. y/o  male here for CPE   ICD-9-CM ICD-10-CM   1. Annual physical exam V70.0 Z00.00 POCT CBC     POCT  SEDIMENTATION RATE     POCT urinalysis dipstick     COMPLETE METABOLIC PANEL WITH GFR     Lipid panel     Testosterone, Free, Total, SHBG     Thyroid Panel With TSH     PSA     EKG 12-Lead     DG Bone Density  2. Insomnia 780.52 G47.00   3. Dizziness and giddiness 780.4 R42 POCT CBC     POCT SEDIMENTATION RATE     COMPLETE METABOLIC PANEL WITH GFR     Testosterone, Free, Total, SHBG     Ambulatory referral to Cardiology    Signed, Robyn Haber, MD 12/27/2015 9:08 AM

## 2015-12-27 NOTE — Patient Instructions (Signed)

## 2015-12-27 NOTE — Addendum Note (Signed)
Addended by: Robyn Haber on: 12/27/2015 09:57 AM   Modules accepted: Orders, SmartSet

## 2015-12-27 NOTE — Addendum Note (Signed)
Addended by: Burnis Kingfisher on: 12/27/2015 10:04 AM   Modules accepted: Orders, SmartSet

## 2015-12-28 LAB — PSA: PSA: 1.09 ng/mL (ref <=4.00)

## 2016-01-04 LAB — TESTOS,TOTAL,FREE AND SHBG (FEMALE)
Sex Hormone Binding Glob.: 23 nmol/L (ref 10–50)
Testosterone, Free: 57.6 pg/mL (ref 35.0–155.0)
Testosterone,Total,LC/MS/MS: 341 ng/dL (ref 250–1100)

## 2016-01-11 ENCOUNTER — Ambulatory Visit
Admission: RE | Admit: 2016-01-11 | Discharge: 2016-01-11 | Disposition: A | Discharge status: Home / Self Care | Payer: 59 | Source: Ambulatory Visit | Attending: Family Medicine | Admitting: Family Medicine

## 2016-01-11 DIAGNOSIS — M8580 Other specified disorders of bone density and structure, unspecified site: Secondary | ICD-10-CM

## 2016-01-13 ENCOUNTER — Encounter: Payer: Self-pay | Admitting: Cardiology

## 2016-01-13 ENCOUNTER — Ambulatory Visit (INDEPENDENT_AMBULATORY_CARE_PROVIDER_SITE_OTHER): Payer: 59 | Admitting: Cardiology

## 2016-01-13 VITALS — BP 126/78 | HR 77 | Ht 70.0 in | Wt 261.4 lb

## 2016-01-13 DIAGNOSIS — R55 Syncope and collapse: Secondary | ICD-10-CM | POA: Diagnosis not present

## 2016-01-13 DIAGNOSIS — E785 Hyperlipidemia, unspecified: Secondary | ICD-10-CM | POA: Insufficient documentation

## 2016-01-13 DIAGNOSIS — Z8249 Family history of ischemic heart disease and other diseases of the circulatory system: Secondary | ICD-10-CM | POA: Diagnosis not present

## 2016-01-13 DIAGNOSIS — R42 Dizziness and giddiness: Secondary | ICD-10-CM

## 2016-01-13 DIAGNOSIS — R002 Palpitations: Secondary | ICD-10-CM | POA: Insufficient documentation

## 2016-01-13 MED ORDER — ROSUVASTATIN CALCIUM 5 MG PO TABS: 5.0000 mg | ORAL_TABLET | Freq: Every day | ORAL | Status: DC

## 2016-01-13 NOTE — Patient Instructions (Addendum)
Medication Instructions:   Your physician recommends that you continue on your current medications as directed. Please refer to the Current Medication list given to you today.    Labwork:  IN AROUND 5 WEEKS, PRIOR TO YOUR 6 WEEK FOLLOW-UP APPOINTMENT WITH DR Meda Coffee TO CHECK LIPIDS--PLEASE COME FASTING TO THIS LAB APPOINTMENT    Testing/Procedures:  Your physician has requested that you have an echocardiogram. Echocardiography is a painless test that uses sound waves to create images of your heart. It provides your doctor with information about the size and shape of your heart and how well your heart's chambers and valves are working. This procedure takes approximately one hour. There are no restrictions for this procedure.  Your physician has recommended that you wear a 48 HOUR holter monitor. Holter monitors are medical devices that record the heart's electrical activity. Doctors most often use these monitors to diagnose arrhythmias. Arrhythmias are problems with the speed or rhythm of the heartbeat. The monitor is a small, portable device. You can wear one while you do your normal daily activities. This is usually used to diagnose what is causing palpitations/syncope (passing out).    Follow-Up:  6 WEEKS WITH DR NELSON---PLEASE HAVE YOUR LAB APPOINTMENT SCHEDULED AND DONE PRIOR TO THIS FOLLOW-UP APPOINTMENT       If you need a refill on your cardiac medications before your next appointment, please call your pharmacy.

## 2016-01-13 NOTE — Progress Notes (Signed)
Patient ID: JARRID LORANCE Colon, male   DOB: 1961/02/25, 55 y.o.   MRN: [id number] P   Patient Name: Christopher [name] Colon Date of Encounter: 01/13/2016  Primary Care Provider:  No PCP Per Patient Primary Cardiologist: Dorothy Spark  Problem List   Past Medical History  Diagnosis Date  . GERD (gastroesophageal reflux disease) 10/31/2011  . Hearing impaired 12/17/2013    lifelong   . Hypogonadism male 10/31/2011   Past Surgical History  Procedure Laterality Date  . Tonsillectomy     Allergies  Allergies  Allergen Reactions  . Asa Arthritis Strength-Antacid [Aspirin Buffered]   . Penicillins   . Sulfa Antibiotics    HPI  Pleasant 55 year old gentleman originally from [place] who is coming with concerns of palpitations and shortness of breath. The patient is generally very healthy and has no prior medical history not significant family history of premature coronary artery disease. 3 of his cousins at his age has known coronary artery disease 2 of them underwent coronary artery bypass surgery and one of them passed away from heart attack. The patient exercises by lifting weights with no significant symptoms of shortness of breath or chest pain. He experiences occasional fluttering lasting few seconds but associated with significant shortness of breath. On one occasion he went to the emergency room and was found to have symptomatic PVCs. His concern is that his cousins have similar symptoms. He has never smoked. He denies any prior episode of syncope, orthopnea, paroxysmal nocturnal dyspnea, or lower extremity edema.  01/13/2016  - the patient is coming after one year, he states that he overall feels okay, denies any palpitations, however he has been noticing presyncopal episodes, but no syncope. It is not related to the certain time of the day or activity but he doesn't have any of these episodes during exercise. He works as a Dance movement psychotherapist and lately hasn't been exercising  much. He was using red yeast Rice as instructed but only for a month or 2 and then he quit. He states that his diet is far from being good.  Home Medications  Prior to Admission medications   Not on File   Family History  Family History  Problem Relation Age of Onset  . Cancer Father   . Hyperlipidemia Father   . Heart disease Mother   . Hyperlipidemia Mother    Social History  Social History   Social History  . Marital Status: Married    Spouse Name: N/A  . Number of Children: N/A  . Years of Education: N/A   Occupational History  . [place] History Main Topics  . Smoking status: Never Smoker   . Smokeless tobacco: Not on file  . Alcohol Use: No  . Drug Use: No  . Sexual Activity: Yes   Other Topics Concern  . Not on file   Social History Narrative   Married.  Education: Grade School.     Review of Systems, as per HPI, otherwise negative General:  No chills, fever, night sweats or weight changes.  Cardiovascular:  No chest pain, dyspnea on exertion, edema, orthopnea, palpitations, paroxysmal nocturnal dyspnea. Dermatological: No rash, lesions/masses Respiratory: No cough, dyspnea Urologic: No hematuria, dysuria Abdominal:   No nausea, vomiting, diarrhea, bright red blood per rectum, melena, or hematemesis Neurologic:  No visual changes, wkns, changes in mental status. All other systems reviewed and are otherwise negative except as noted above.  Physical Exam  Blood pressure 126/77, pulse  77, height 5\' 10"  (1.778 m), weight 261 lb 6.4 oz (118.57 kg).  General: Pleasant, NAD Psych: Normal affect. Neuro: Alert and oriented X 3. Moves all extremities spontaneously. HEENT: Normal  Neck: Supple without bruits or JVD. Lungs:  Resp regular and unlabored, CTA. Heart: RRR no s3, s4, or murmurs. Abdomen: Soft, non-tender, non-distended, BS + x 4.  Extremities: No clubbing, cyanosis or edema. DP/PT/Radials 2+ and equal  bilaterally.  Labs:  No results for input(s): CKTOTAL, CKMB, TROPONINI in the last 72 hours. Lab Results  Component Value Date   WBC 6.2 12/27/2015   HGB 14.9 12/27/2015   HCT 40.6* 12/27/2015   MCV 80.8 12/27/2015   PLT 258 12/23/2014    No results found for: DDIMER Invalid input(s): POCBNP    Component Value Date/Time   NA 138 12/27/2015 0859   K 4.2 12/27/2015 0859   CL 104 12/27/2015 0859   CO2 25 12/27/2015 0859   GLUCOSE 105* 12/27/2015 0859   BUN 10 12/27/2015 0859   CREATININE 1.06 12/27/2015 0859   CALCIUM 9.4 12/27/2015 0859   PROT 7.6 12/27/2015 0859   ALBUMIN 4.2 12/27/2015 0859   AST 22 12/27/2015 0859   ALT 23 12/27/2015 0859   ALKPHOS 67 12/27/2015 0859   BILITOT 0.8 12/27/2015 0859   GFRNONAA 79 12/27/2015 0859   GFRAA >89 12/27/2015 0859   Lab Results  Component Value Date   CHOL 205* 12/27/2015   HDL 49 12/27/2015   LDLCALC 133* 12/27/2015   TRIG 116 12/27/2015   ETT: 02/2014 ETT Interpretation:  normal - no evidence of ischemia by ST analysis Comments: Patient presents today for routine GXT. Has strong FH for CAD - he is obese. Has had some dyspnea and palpitations.   Today the patient exercised on the standard Bruce protocol for a total of 9 minutes.  Good exercise tolerance.  Adequate blood pressure response.  Clinically negative for chest pain. Test was stopped due to achievement of target HR.  EKG negative for ischemia. No significant arrhythmia noted.    Accessory Clinical Findings  Echocardiogram - 2009,Overall left ventricular systolic function was normal. Left ventricular ejection fraction was estimated to be 60 %. There were no left ventricular regional wall motion abnormalities. - Estimated peak pulmonary artery systolic pressure 24 mmHg.  ECG - sinus rhythm, normal EKG.   Assessment & Plan  Pleasant 55 year old gentleman   1. Frequent presyncopal episodes a curetting several times a week, we will repeat 48 hour Holter  monitor and obtain echocardiogram to evaluate for anatomy and function. Orthostatic performed today negative.  2. Palpitations - now improved previously frequent PVCs, since we are doing a 48-hour Holter monitor for presyncopal episodes also evaluate frequency of these PVCs, and potentially any other arrhythmias that can be causing his symptoms.  3. Hyperlipidemia - LDL 133, he's advised to start taking rosuvastatin 5 mg daily, his has stent as his diet has been poor, he wants to change his diet dramatically and recheck his cholesterol in 2 months. If his LDL doesn't change significantly obese trunk without locating to start taking statin as his family history of premature CAD is quite strong.  Follow-up in 2 months.   Dorothy Spark, MD, [facility] 01/13/2016, 9:27 AM

## 2016-01-19 ENCOUNTER — Encounter: Payer: Self-pay | Admitting: *Deleted

## 2016-01-25 ENCOUNTER — Ambulatory Visit (INDEPENDENT_AMBULATORY_CARE_PROVIDER_SITE_OTHER): Payer: 59

## 2016-01-25 ENCOUNTER — Other Ambulatory Visit: Payer: Self-pay

## 2016-01-25 ENCOUNTER — Ambulatory Visit (HOSPITAL_COMMUNITY): Payer: 59 | Attending: Internal Medicine

## 2016-01-25 DIAGNOSIS — I517 Cardiomegaly: Secondary | ICD-10-CM | POA: Diagnosis not present

## 2016-01-25 DIAGNOSIS — R55 Syncope and collapse: Secondary | ICD-10-CM | POA: Diagnosis not present

## 2016-01-25 DIAGNOSIS — E785 Hyperlipidemia, unspecified: Secondary | ICD-10-CM | POA: Insufficient documentation

## 2016-01-25 DIAGNOSIS — I340 Nonrheumatic mitral (valve) insufficiency: Secondary | ICD-10-CM | POA: Diagnosis not present

## 2016-01-25 DIAGNOSIS — Z8249 Family history of ischemic heart disease and other diseases of the circulatory system: Secondary | ICD-10-CM

## 2016-01-25 DIAGNOSIS — I071 Rheumatic tricuspid insufficiency: Secondary | ICD-10-CM | POA: Insufficient documentation

## 2016-01-25 DIAGNOSIS — R42 Dizziness and giddiness: Secondary | ICD-10-CM

## 2016-02-17 ENCOUNTER — Other Ambulatory Visit: Payer: 59

## 2016-02-20 ENCOUNTER — Other Ambulatory Visit (INDEPENDENT_AMBULATORY_CARE_PROVIDER_SITE_OTHER): Payer: 59 | Admitting: *Deleted

## 2016-02-20 ENCOUNTER — Other Ambulatory Visit: Payer: Self-pay | Admitting: Family Medicine

## 2016-02-20 DIAGNOSIS — R42 Dizziness and giddiness: Secondary | ICD-10-CM

## 2016-02-20 DIAGNOSIS — E291 Testicular hypofunction: Secondary | ICD-10-CM

## 2016-02-20 DIAGNOSIS — Z8249 Family history of ischemic heart disease and other diseases of the circulatory system: Secondary | ICD-10-CM

## 2016-02-20 DIAGNOSIS — E785 Hyperlipidemia, unspecified: Secondary | ICD-10-CM

## 2016-02-20 LAB — LIPID PANEL
Cholesterol: 168 mg/dL (ref 125–200)
HDL: 43 mg/dL (ref >=40)
LDL Cholesterol: 112 mg/dL (ref <130)
Total CHOL/HDL Ratio: 3.9 Ratio (ref <=5.0)
Triglycerides: 63 mg/dL (ref <150)
VLDL: 13 mg/dL (ref <30)

## 2016-02-21 ENCOUNTER — Telehealth: Payer: Self-pay | Admitting: *Deleted

## 2016-02-21 MED ORDER — RED YEAST RICE 600 MG PO CAPS: 600.0000 mg | ORAL_CAPSULE | Freq: Every day | ORAL | Status: DC

## 2016-02-21 NOTE — Telephone Encounter (Signed)
Notified the pt that per Dr Meda Coffee, his labs showed that his LDL has improved, and she recommends that he continue the same diet and exercise regimen.  Also informed the pt that per Dr Meda Coffee, she recommends that he start taking OTC red yeast rice 600 mg po daily.  Pt states he will purchase this over-the-counter.  Added this med on pts active med list.  Pt verbalized understanding and agrees with this plan.

## 2016-02-21 NOTE — Telephone Encounter (Signed)
-----   Message from Dorothy Spark, MD sent at 02/21/2016  7:03 AM EDT ----- His LDL has improved, I would continue the same diet and increase exercise. I would recommend OTC red yeast rice 600 mg po daily.

## 2016-02-22 LAB — TESTOS,TOTAL,FREE AND SHBG (FEMALE)
Sex Hormone Binding Glob.: 31 nmol/L (ref 10–50)
Testosterone, Free: 53.1 pg/mL (ref 35.0–155.0)
Testosterone,Total,LC/MS/MS: 314 ng/dL (ref 250–1100)

## 2016-02-23 ENCOUNTER — Ambulatory Visit (INDEPENDENT_AMBULATORY_CARE_PROVIDER_SITE_OTHER): Payer: 59 | Admitting: Cardiology

## 2016-02-23 ENCOUNTER — Encounter: Payer: Self-pay | Admitting: Cardiology

## 2016-02-23 VITALS — BP 122/78 | HR 68 | Ht 70.0 in | Wt 243.0 lb

## 2016-02-23 DIAGNOSIS — R42 Dizziness and giddiness: Secondary | ICD-10-CM | POA: Diagnosis not present

## 2016-02-23 DIAGNOSIS — R002 Palpitations: Secondary | ICD-10-CM | POA: Diagnosis not present

## 2016-02-23 DIAGNOSIS — E785 Hyperlipidemia, unspecified: Secondary | ICD-10-CM

## 2016-02-23 DIAGNOSIS — R55 Syncope and collapse: Secondary | ICD-10-CM

## 2016-02-23 NOTE — Patient Instructions (Signed)
Medication Instructions:   Your physician recommends that you continue on your current medications as directed. Please refer to the Current Medication list given to you today.   Labwork:  PRIOR TO YOUR ONE YEAR FOLLOW-UP APPOINTMENT WITH DR Meda Coffee TO CHECK---CMET AND LIPIDS---PLEASE COME FASTING TO THIS LAB APPOINTMENT    Follow-Up:  Your physician wants you to follow-up in: [place] will receive a reminder letter in the mail two months in advance. If you don't receive a letter, please call our office to schedule the follow-up appointment.  PLEASE HAVE YOUR LABS DONE PRIOR TO THIS APPOINTMENT       If you need a refill on your cardiac medications before your next appointment, please call your pharmacy.

## 2016-02-23 NOTE — Progress Notes (Signed)
Patient ID: Christopher [name], male   DOB: 1961/07/14, 55 y.o.   MRN: [id number]    Patient Name: Christopher [name] Date of Encounter: 02/23/2016  Primary Care Provider:  Leandrew Koyanagi, MD Primary Cardiologist: Ena Dawley  Problem List   Past Medical History  Diagnosis Date  . GERD (gastroesophageal reflux disease) 10/31/2011  . Hearing impaired 12/17/2013    lifelong   . Hypogonadism male 10/31/2011   Past Surgical History  Procedure Laterality Date  . Tonsillectomy     Allergies  Allergies  Allergen Reactions  . Asa Arthritis Strength-Antacid [Aspirin Buffered]   . Penicillins   . Sulfa Antibiotics    HPI  Pleasant 55 year old gentleman originally from [place] who is coming with concerns of palpitations and shortness of breath. The patient is generally very healthy and has no prior medical history not significant family history of premature coronary artery disease. 3 of his cousins at his age has known coronary artery disease 2 of them underwent coronary artery bypass surgery and one of them passed away from heart attack. The patient exercises by lifting weights with no significant symptoms of shortness of breath or chest pain. He experiences occasional fluttering lasting few seconds but associated with significant shortness of breath. On one occasion he went to the emergency room and was found to have symptomatic PVCs. His concern is that his cousins have similar symptoms. He has never smoked. He denies any prior episode of syncope, orthopnea, paroxysmal nocturnal dyspnea, or lower extremity edema.  01/13/2016  - the patient is coming after one year, he states that he overall feels okay, denies any palpitations, however he has been noticing presyncopal episodes, but no syncope. It is not related to the certain time of the day or activity but he doesn't have any of these episodes during exercise. He works as a Dance movement psychotherapist and lately hasn't been exercising  much. He was using red yeast Rice as instructed but only for a month or 2 and then he quit. He states that his diet is far from being good.  02/23/2016, the patient is coming after 6 weeks, he states that he symptoms of presyncope have resolved, he has started regular exercise and is also focusing more on his diet generally feels much better. He underwent an echocardiogram that was completely normal with normal systolic diastolic function. He also had a Holter monitor during which had one episode of presyncope however there were no pauses no arrhythmias and only few PVCs and PACs. He hasn't started taking red yeast rice yet as he wanted to focus on diet first.  Home Medications  Prior to Admission medications   Not on File   Family History  Family History  Problem Relation Age of Onset  . Cancer Father   . Hyperlipidemia Father   . Heart disease Mother   . Hyperlipidemia Mother    Social History  Social History   Social History  . Marital Status: Married    Spouse Name: N/A  . Number of Children: N/A  . Years of Education: N/A   Occupational History  . [employer] History Main Topics  . Smoking status: Never Smoker   . Smokeless tobacco: Not on file  . Alcohol Use: No  . Drug Use: No  . Sexual Activity: Yes   Other Topics Concern  . Not on file   Social History Narrative   Married.  Education: Grade School.     Review  of Systems, as per HPI, otherwise negative General:  No chills, fever, night sweats or weight changes.  Cardiovascular:  No chest pain, dyspnea on exertion, edema, orthopnea, palpitations, paroxysmal nocturnal dyspnea. Dermatological: No rash, lesions/masses Respiratory: No cough, dyspnea Urologic: No hematuria, dysuria Abdominal:   No nausea, vomiting, diarrhea, bright red blood per rectum, melena, or hematemesis Neurologic:  No visual changes, wkns, changes in mental status. All other systems reviewed and are  otherwise negative except as noted above.  Physical Exam  Blood pressure 122/78, pulse 68, height 5\' 10"  (1.778 m), weight 243 lb (110.224 kg).  General: Pleasant, NAD Psych: Normal affect. Neuro: Alert and oriented X 3. Moves all extremities spontaneously. HEENT: Normal  Neck: Supple without bruits or JVD. Lungs:  Resp regular and unlabored, CTA. Heart: RRR no s3, s4, or murmurs. Abdomen: Soft, non-tender, non-distended, BS + x 4.  Extremities: No clubbing, cyanosis or edema. DP/PT/Radials 2+ and equal bilaterally.  Labs:  No results for input(s): CKTOTAL, CKMB, TROPONINI in the last 72 hours. Lab Results  Component Value Date   WBC 6.2 12/27/2015   HGB 14.9 12/27/2015   HCT 40.6* 12/27/2015   MCV 80.8 12/27/2015   PLT 258 12/23/2014    No results found for: DDIMER Invalid input(s): POCBNP    Component Value Date/Time   NA 138 12/27/2015 0859   K 4.2 12/27/2015 0859   CL 104 12/27/2015 0859   CO2 25 12/27/2015 0859   GLUCOSE 105* 12/27/2015 0859   BUN 10 12/27/2015 0859   CREATININE 1.06 12/27/2015 0859   CALCIUM 9.4 12/27/2015 0859   PROT 7.6 12/27/2015 0859   ALBUMIN 4.2 12/27/2015 0859   AST 22 12/27/2015 0859   ALT 23 12/27/2015 0859   ALKPHOS 67 12/27/2015 0859   BILITOT 0.8 12/27/2015 0859   GFRNONAA 79 12/27/2015 0859   GFRAA >89 12/27/2015 0859   Lab Results  Component Value Date   CHOL 168 02/20/2016   HDL 43 02/20/2016   LDLCALC 112 02/20/2016   TRIG 63 02/20/2016   ETT: 02/2014 ETT Interpretation:  normal - no evidence of ischemia by ST analysis Comments: Patient presents today for routine GXT. Has strong FH for CAD - he is obese. Has had some dyspnea and palpitations.   Today the patient exercised on the standard Bruce protocol for a total of 9 minutes.  Good exercise tolerance.  Adequate blood pressure response.  Clinically negative for chest pain. Test was stopped due to achievement of target HR.  EKG negative for ischemia. No significant  arrhythmia noted.   48-Holter monitor  Rare PVCs, infrequent PACs.  No arrhythmias or significant pauses identified. Normal Holter monitor, no therapy necessary.  Accessory Clinical Findings  Echocardiogram - 2009,Overall left ventricular systolic function was normal. Left ventricular ejection fraction was estimated to be 60 %. There were no left ventricular regional wall motion abnormalities. - Estimated peak pulmonary artery systolic pressure 24 mmHg.  TTE: 01/25/2016 - Left ventricle: The cavity size was normal. Wall thickness was  normal. Systolic function was normal. The estimated ejection  fraction was in the range of 60% to 65%. Wall motion was normal;  there were no regional wall motion abnormalities. Left  ventricular diastolic function parameters were normal. - Mitral valve: Mildly thickened leaflets . There was trivial  regurgitation. - Left atrium: The atrium was normal in size. - Right atrium: The atrium was mildly dilated. - Inferior vena cava: The vessel was normal in size. The  respirophasic diameter changes were in  the normal range (>= 50%),  consistent with normal central venous pressure.  Impressions: - LVEF 60-65%, normal wall thickness, normal wall motion, normal  diastolic function, normal LA size, mildly dilated RA, no  significant TR, normal IVC.  ECG - sinus rhythm, normal EKG.    Assessment & Plan  Pleasant 55 year old gentleman   1. Frequent presyncopal episodes a curetting several times a week - 48 hour Holter monitor during which he had an episode of presyncope showed only a few pieces PVCs otherwise no arrhythmias or pauses. His symptoms have resolved with exercise and dehydration. His echocardiogram showed normal systolic and diastolic function.  2. Palpitations - improved, Holter monitor only showed a few PACs and PVCs.  3. Hyperlipidemia - LDL 133, LDL improved to 112 with diet and exercise he is reluctant to take medication, he  is encouraged to continue with his exercise and diet regimen for now we'll hold off any medication.  Follow-up in one year CMP and lipids prior to the visit.  Ena Dawley, MD, [facility] 02/23/2016, 8:58 AM

## 2016-02-24 ENCOUNTER — Telehealth: Payer: Self-pay | Admitting: Emergency Medicine

## 2016-02-24 NOTE — Telephone Encounter (Signed)
-----   Message from Robyn Haber, MD sent at 02/22/2016  7:58 PM EDT ----- Please inform patient of normal result.  Testosterone is in the low but normal range.  Usually, no new treatment is needed.

## 2016-02-24 NOTE — Telephone Encounter (Signed)
Left message for pt to call for lab results 

## 2016-09-27 ENCOUNTER — Encounter: Payer: Self-pay | Admitting: Physician Assistant

## 2016-09-27 ENCOUNTER — Ambulatory Visit (INDEPENDENT_AMBULATORY_CARE_PROVIDER_SITE_OTHER): Payer: 59

## 2016-09-27 ENCOUNTER — Ambulatory Visit (INDEPENDENT_AMBULATORY_CARE_PROVIDER_SITE_OTHER): Payer: 59 | Admitting: Physician Assistant

## 2016-09-27 VITALS — BP 136/92 | HR 74 | Temp 98.4°F | Resp 16 | Ht 70.0 in | Wt 266.2 lb

## 2016-09-27 DIAGNOSIS — R079 Chest pain, unspecified: Secondary | ICD-10-CM

## 2016-09-27 DIAGNOSIS — R05 Cough: Secondary | ICD-10-CM | POA: Diagnosis not present

## 2016-09-27 LAB — TROPONIN I: Troponin I: <0.01 ng/mL (ref 0.00–0.04)

## 2016-09-27 LAB — POCT GLYCOSYLATED HEMOGLOBIN (HGB A1C): HEMOGLOBIN A1C: 5.6

## 2016-09-27 NOTE — Progress Notes (Signed)
09/27/2016 11:31 AM   DOB: September 05, 1961 / MRN: [id number]  SUBJECTIVE:  Christopher [name] is a 56 y.o. male with a history of hyperlipidemia, palpitations, SOB, PVCs presenting for chest pain.  Reports he recently purchased an elliptical machine and the chest pain started after that. Using his last lipid panel and today's BP his risk of 5.8%.  His chart says he has a family history of CAD in his Christopher [name] however he tells me that his Christopher [name] never had a heart attack and is still alive and doing well today. He does say she has a history of HTN.   He reports the pain is left substernal, sharp, and last two seconds. 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  Nothing makes the pain better or worse.  He denies any SOB, new DOE, cough, leg swelling, orthopnea, diaphoresis or presyncope with the pain.     He is allergic to asa arthritis strength-antacid [aspirin buffered]; penicillins; and sulfa antibiotics.   He  has a past medical history of GERD (gastroesophageal reflux disease) (10/31/2011); Hearing impaired (12/17/2013); and Hypogonadism male (10/31/2011).    He  reports that he has never smoked. He has never used smokeless tobacco. He reports that he does not drink alcohol or use drugs. He  reports that he currently engages in sexual activity. The patient  has a past surgical history that includes Tonsillectomy.  His family history includes Cancer in his father; Heart disease in his Christopher [name]; Hyperlipidemia in his father and Christopher [name].  Review of Systems  Constitutional: Negative for fever.  Respiratory: Negative for cough and shortness of breath.   Cardiovascular: Positive for chest pain. Negative for palpitations, orthopnea and leg swelling.  Gastrointestinal: Negative for abdominal pain and nausea.  Musculoskeletal: Negative for myalgias.  Skin: Negative for rash.   Neurological: Negative for dizziness.    The problem list and medications were reviewed and updated by myself where necessary and exist elsewhere in the encounter.   OBJECTIVE:  BP (!) 136/92 (BP Location: Right Arm, Patient Position: Sitting, Cuff Size: Large)   Pulse 74   Temp 98.4 F (36.9 C) (Oral)   Resp 16   Ht 5\' 10"  (1.778 m)   Wt 266 lb 3.2 oz (120.7 kg)   SpO2 98%   BMI 38.20 kg/m   Physical Exam  Constitutional: He is oriented to person, place, and time. He appears well-developed. He does not appear ill.  Eyes: Conjunctivae and EOM are normal. Pupils are equal, round, and reactive to light.  Cardiovascular: Normal rate, regular rhythm and normal heart sounds.  Exam reveals no gallop and no friction rub.   No murmur heard. Pulmonary/Chest: Effort normal and breath sounds normal. He exhibits no mass, no tenderness and no swelling.  Abdominal: Bowel sounds are normal. He exhibits no distension.  Musculoskeletal: Normal range of motion.  Neurological: He is alert and oriented to person, place, and time. No cranial nerve deficit. Coordination normal.  Skin: Skin is warm and dry. He is not diaphoretic.  Psychiatric: He has a normal mood and affect.  Nursing note and vitals reviewed.   Lab Results  Component Value Date   CHOL 168 02/20/2016   HDL 43 02/20/2016   LDLCALC 112 02/20/2016   TRIG 63 02/20/2016   CHOLHDL 3.9 02/20/2016   Lab Results  Component Value Date  CREATININE 1.06 12/27/2015   BUN 10 12/27/2015   NA 138 12/27/2015   K 4.2 12/27/2015   CL 104 12/27/2015   CO2 25 12/27/2015   Lab Results  Component Value Date   ALT 23 12/27/2015   AST 22 12/27/2015   ALKPHOS 67 12/27/2015   BILITOT 0.8 12/27/2015    Results for orders placed or performed in visit on 09/27/16 (from the past 72 hour(s))  POCT glycosylated hemoglobin (Hb A1C)     Status: None   Collection Time: 09/27/16 11:04 AM  Result Value Ref Range   Hemoglobin A1C 5.6     No  results found.  ASSESSMENT AND PLAN:  Achilles was seen today for chest pain.  Diagnoses and all orders for this visit:  Chest pain, unspecified type: the way he describes his pain seems non cardiac. However I can not reproduce his symptoms on exam.  He is currently pain free and his EKG shows no sign of ischemia or infarction. I will run a troponin and given the half life of this lab we should pick up on any damage as his most recent pain was last night. He understands that if he has a positive here he will have to go to the ED via 911.  Given his age and questionable family history I want him evaluated by cardiology. Using his most recent data his ASCVD risk is at 5.8% which is also very reassuring.  -     EKG 12-Lead -     Troponin I -     DG Chest 2 View; Future -     POCT glycosylated hemoglobin (Hb A1C) -     Lipid panel -     AMB Cardiology    The patient is advised to call or return to clinic if he does not see an improvement in symptoms, or to seek the care of the closest emergency department if he worsens with the above plan.   Philis Fendt, MHS, PA-C [facility] 09/27/2016 11:31 AM

## 2016-09-28 ENCOUNTER — Telehealth: Payer: Self-pay

## 2016-09-28 NOTE — Telephone Encounter (Signed)
Pt wife is needing to have someone to call her and discuss his visit -due to him being hard of hearing and she wants to make sure that she understands the diagnosis   Best number [phone]

## 2016-10-11 NOTE — Telephone Encounter (Signed)
Wife already spoke to someone

## 2016-10-15 DIAGNOSIS — R002 Palpitations: Secondary | ICD-10-CM | POA: Diagnosis not present

## 2016-10-15 DIAGNOSIS — R0789 Other chest pain: Secondary | ICD-10-CM | POA: Diagnosis not present

## 2016-10-22 DIAGNOSIS — R0789 Other chest pain: Secondary | ICD-10-CM | POA: Diagnosis not present

## 2016-11-08 ENCOUNTER — Ambulatory Visit (INDEPENDENT_AMBULATORY_CARE_PROVIDER_SITE_OTHER): Payer: 59 | Admitting: Emergency Medicine

## 2016-11-08 VITALS — BP 112/70 | HR 79 | Temp 99.2°F | Resp 16 | Ht 70.0 in | Wt 255.6 lb

## 2016-11-08 DIAGNOSIS — J111 Influenza due to unidentified influenza virus with other respiratory manifestations: Secondary | ICD-10-CM | POA: Diagnosis not present

## 2016-11-08 DIAGNOSIS — R6889 Other general symptoms and signs: Secondary | ICD-10-CM

## 2016-11-08 LAB — POCT INFLUENZA A/B
INFLUENZA A, POC: NEGATIVE
Influenza B, POC: POSITIVE — AB

## 2016-11-08 MED ORDER — OSELTAMIVIR PHOSPHATE 75 MG PO CAPS
75.0000 mg | ORAL_CAPSULE | Freq: Two times a day (BID) | ORAL | 0 refills | Status: AC
Start: 2016-11-08 — End: 2016-11-13

## 2016-11-08 NOTE — Progress Notes (Signed)
Christopher [name] 56 y.o.   Chief Complaint  Patient presents with  . Generalized Body Aches    Last few days, comes and goes  . Cough    Started yesterday  . Nasal Congestion    Started today    HISTORY OF PRESENT ILLNESS: This is a 56 y.o. male complaining of flu symptoms since yesterday.  Influenza  This is a new problem. The current episode started yesterday. The problem occurs constantly. The problem has been rapidly worsening. Associated symptoms include congestion, coughing, fatigue, a fever, headaches and myalgias. Pertinent negatives include no abdominal pain, chest pain, chills, diaphoresis, nausea, rash, sore throat, swollen glands, visual change, vomiting or weakness. Nothing aggravates the symptoms. He has tried nothing for the symptoms.     Prior to Admission medications   Not on File    Allergies  Allergen Reactions  . Asa Arthritis Strength-Antacid [Aspirin Buffered]   . Penicillins   . Sulfa Antibiotics     Patient Active Problem List   Diagnosis Date Noted  . Family history of premature CAD 01/13/2016  . Hyperlipidemia 01/13/2016  . Testosterone deficiency 05/07/2014  . Hearing impaired 12/17/2013  . GERD (gastroesophageal reflux disease) 10/31/2011  . Hypogonadism male 10/31/2011    Past Medical History:  Diagnosis Date  . GERD (gastroesophageal reflux disease) 10/31/2011  . Hearing impaired 12/17/2013   lifelong   . 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 TONSILLECTOMY      Social History   Social History  . Marital status: Married    Spouse name: N/A  . Number of children: N/A  . Years of education: N/A   Occupational History  . [place] History Main Topics  . Smoking status: Never Smoker  . Smokeless tobacco: Never Used  . Alcohol use No  . Drug use: No  . Sexual activity: Yes   Other Topics Concern  . Not on file   Social History  Narrative   Married.  Education: Grade School.    Family History  Problem Relation Age of Onset  . Cancer Father   . Hyperlipidemia Father   . Heart disease Mother   . Hyperlipidemia Mother      Review of Systems  Constitutional: Positive for fatigue and fever. Negative for chills and diaphoresis.  HENT: Positive for congestion. Negative for ear discharge, ear pain, sinus pain and sore throat.   Eyes: Negative for blurred vision, double vision, discharge and redness.  Respiratory: Positive for cough. Negative for shortness of breath and wheezing.   Cardiovascular: Negative for chest pain and palpitations.  Gastrointestinal: Negative for abdominal pain, diarrhea, nausea and vomiting.  Genitourinary: Negative for hematuria.  Musculoskeletal: Positive for myalgias.  Skin: Negative for rash.  Neurological: Positive for headaches. Negative for dizziness, sensory change, focal weakness and weakness.  All other systems reviewed and are negative.  Vitals:   11/08/16 1629  BP: 112/70  Pulse: 79  Resp: 16  Temp: 99.2 F (37.3 C)     Physical Exam  Constitutional: He is oriented to person, place, and time. He appears well-developed and well-nourished.  HENT:  Head: Normocephalic and atraumatic.  Nose: Nose normal.  Mouth/Throat: Oropharynx is clear and moist. No oropharyngeal exudate.  Eyes: Conjunctivae and EOM are normal. Pupils are equal, round, and reactive to light.  Neck: Normal range of motion. Neck supple. No JVD present. No thyromegaly present.  Cardiovascular: Normal rate, regular  rhythm and normal heart sounds.   Pulmonary/Chest: Effort normal and breath sounds normal.  Abdominal: Soft. Bowel sounds are normal. He exhibits no distension. There is no tenderness.  Musculoskeletal: Normal range of motion.  Lymphadenopathy:    He has no cervical adenopathy.  Neurological: He is alert and oriented to person, place, and time. No sensory deficit. He exhibits normal muscle  tone.  Skin: Skin is warm and dry. Capillary refill takes less than 2 seconds.  Psychiatric: He has a normal mood and affect. His behavior is normal.  Vitals reviewed.    ASSESSMENT & PLAN: Christopher [name] was seen today for generalized body aches, cough and nasal congestion.  Diagnoses and all orders for this visit:  Influenza with respiratory manifestation other than pneumonia -     POCT Influenza A/B  Influenza  Flu-like symptoms  Other orders -     oseltamivir (TAMIFLU) 75 MG capsule; Take 1 capsule (75 mg total) by mouth 2 (two) times daily.    Patient Instructions       IF you received an x-ray today, you will receive an invoice from [facility]. Please contact [facility] at [phone] with questions or concerns regarding your invoice.   IF you received labwork today, you will receive an invoice from [facility]. Please contact [facility] at [phone] with questions or concerns regarding your invoice.   Our billing staff will not be able to assist you with questions regarding bills from these companies.  You will be contacted with the lab results as soon as they are available. The fastest way to get your results is to activate your My Chart account. Instructions are located on the last page of this paperwork. If you have not heard from [place] regarding the results in 2 weeks, please contact this office.     Influenza, Adult Influenza ("the flu") is an infection in the lungs, nose, and throat (respiratory tract). It is caused by a virus. The flu causes many common cold symptoms, as well as a high fever and body aches. It can make you feel very sick. The flu spreads easily from person to person (is contagious). Getting a flu shot (influenza vaccination) every year is the best way to prevent the flu. Follow these instructions at home:  Take over-the-counter and prescription medicines only as told by your doctor.  Use a cool mist humidifier to add moisture  (humidity) to the air in your home. This can make it easier to breathe.  Rest as needed.  Drink enough fluid to keep your pee (urine) clear or pale yellow.  Cover your mouth and nose when you cough or sneeze.  Wash your hands with soap and water often, especially after you cough or sneeze. If you cannot use soap and water, use hand sanitizer.  Stay home from work or school as told by your doctor. Unless you are visiting your doctor, try to avoid leaving home until your fever has been gone for 24 hours without the use of medicine.  Keep all follow-up visits as told by your doctor. This is important. How is this prevented?  Getting a yearly (annual) flu shot is the best way to avoid getting the flu. You may get the flu shot in late summer, fall, or winter. Ask your doctor when you should get your flu shot.  Wash your hands often or use hand sanitizer often.  Avoid contact with people who are sick during cold and flu season.  Eat healthy foods.  Drink plenty of fluids.  Get enough sleep.  Exercise regularly. Contact a doctor if:  You get new symptoms.  You have:  Chest pain.  Watery poop (diarrhea).  A fever.  Your cough gets worse.  You start to have more mucus.  You feel sick to your stomach (nauseous).  You throw up (vomit). Get help right away if:  You start to be short of breath or have trouble breathing.  Your skin or nails turn a bluish color.  You have very bad pain or stiffness in your neck.  You get a sudden headache.  You get sudden pain in your face or ear.  You cannot stop throwing up. This information is not intended to replace advice given to you by your health care provider. Make sure you discuss any questions you have with your health care provider. Document Released: 06/12/2008 Document Revised: 02/09/2016 Document Reviewed: 06/28/2015 Elsevier Interactive Patient Education  2017 [employer] Inc.      Agustina Caroli, MD [facility]

## 2016-11-08 NOTE — Patient Instructions (Addendum)
     IF you received an x-ray today, you will receive an invoice from [facility]. Please contact [facility] at [phone] with questions or concerns regarding your invoice.   IF you received labwork today, you will receive an invoice from [facility]. Please contact [facility] at [phone] with questions or concerns regarding your invoice.   Our billing staff will not be able to assist you with questions regarding bills from these companies.  You will be contacted with the lab results as soon as they are available. The fastest way to get your results is to activate your My Chart account. Instructions are located on the last page of this paperwork. If you have not heard from us regarding the results in 2 weeks, please contact this office.      Influenza, Adult Influenza ("the flu") is an infection in the lungs, nose, and throat (respiratory tract). It is caused by a virus. The flu causes many common cold symptoms, as well as a high fever and body aches. It can make you feel very sick. The flu spreads easily from person to person (is contagious). Getting a flu shot (influenza vaccination) every year is the best way to prevent the flu. Follow these instructions at home:  Take over-the-counter and prescription medicines only as told by your doctor.  Use a cool mist humidifier to add moisture (humidity) to the air in your home. This can make it easier to breathe.  Rest as needed.  Drink enough fluid to keep your pee (urine) clear or pale yellow.  Cover your mouth and nose when you cough or sneeze.  Wash your hands with soap and water often, especially after you cough or sneeze. If you cannot use soap and water, use hand sanitizer.  Stay home from work or school as told by your doctor. Unless you are visiting your doctor, try to avoid leaving home until your fever has been gone for 24 hours without the use of medicine.  Keep all follow-up visits as told by your doctor.  This is important. How is this prevented?  Getting a yearly (annual) flu shot is the best way to avoid getting the flu. You may get the flu shot in late summer, fall, or winter. Ask your doctor when you should get your flu shot.  Wash your hands often or use hand sanitizer often.  Avoid contact with people who are sick during cold and flu season.  Eat healthy foods.  Drink plenty of fluids.  Get enough sleep.  Exercise regularly. Contact a doctor if:  You get new symptoms.  You have:  Chest pain.  Watery poop (diarrhea).  A fever.  Your cough gets worse.  You start to have more mucus.  You feel sick to your stomach (nauseous).  You throw up (vomit). Get help right away if:  You start to be short of breath or have trouble breathing.  Your skin or nails turn a bluish color.  You have very bad pain or stiffness in your neck.  You get a sudden headache.  You get sudden pain in your face or ear.  You cannot stop throwing up. This information is not intended to replace advice given to you by your health care provider. Make sure you discuss any questions you have with your health care provider. Document Released: 06/12/2008 Document Revised: 02/09/2016 Document Reviewed: 06/28/2015 Elsevier Interactive Patient Education  2017 [employer] Inc.  

## 2016-12-27 ENCOUNTER — Ambulatory Visit (INDEPENDENT_AMBULATORY_CARE_PROVIDER_SITE_OTHER): Payer: 59 | Admitting: Emergency Medicine

## 2016-12-27 VITALS — BP 113/63 | HR 64 | Temp 98.1°F | Resp 17 | Ht 69.75 in | Wt 244.0 lb

## 2016-12-27 DIAGNOSIS — E291 Testicular hypofunction: Secondary | ICD-10-CM | POA: Diagnosis not present

## 2016-12-27 DIAGNOSIS — Z0000 Encounter for general adult medical examination without abnormal findings: Secondary | ICD-10-CM | POA: Diagnosis not present

## 2016-12-27 DIAGNOSIS — I493 Ventricular premature depolarization: Secondary | ICD-10-CM

## 2016-12-27 DIAGNOSIS — R7989 Other specified abnormal findings of blood chemistry: Secondary | ICD-10-CM

## 2016-12-27 LAB — POCT URINALYSIS DIP (MANUAL ENTRY)
BILIRUBIN UA: NEGATIVE
BILIRUBIN UA: NEGATIVE mg/dL
Glucose, UA: NEGATIVE mg/dL
Leukocytes, UA: NEGATIVE
Nitrite, UA: NEGATIVE
PH UA: 6 (ref 5.0–8.0)
Protein Ur, POC: NEGATIVE mg/dL
Spec Grav, UA: <=1.005 — AB (ref 1.010–1.025)
Urobilinogen, UA: 0.2 E.U./dL

## 2016-12-27 NOTE — Patient Instructions (Addendum)
   IF you received an x-ray today, you will receive an invoice from [facility]. Please contact [facility] at [phone] with questions or concerns regarding your invoice.   IF you received labwork today, you will receive an invoice from [facility]. Please contact [facility] at [phone] with questions or concerns regarding your invoice.   Our billing staff will not be able to assist you with questions regarding bills from these companies.  You will be contacted with the lab results as soon as they are available. The fastest way to get your results is to activate your My Chart account. Instructions are located on the last page of this paperwork. If you have not heard from us regarding the results in 2 weeks, please contact this office.        Health Maintenance, Male A healthy lifestyle and preventive care is important for your health and wellness. Ask your health care provider about what schedule of regular examinations is right for you. What should I know about weight and diet?  Eat a Healthy Diet  Eat plenty of vegetables, fruits, whole grains, low-fat dairy products, and lean protein.  Do not eat a lot of foods high in solid fats, added sugars, or salt. Maintain a Healthy Weight  Regular exercise can help you achieve or maintain a healthy weight. You should:  Do at least 150 minutes of exercise each week. The exercise should increase your heart rate and make you sweat (moderate-intensity exercise).  Do strength-training exercises at least twice a week. Watch Your Levels of Cholesterol and Blood Lipids  Have your blood tested for lipids and cholesterol every 5 years starting at 56 years of age. If you are at high risk for heart disease, you should start having your blood tested when you are 56 years old. You may need to have your cholesterol levels checked more often if:  Your lipid or cholesterol levels are high.  You are older than 56 years of  age.  You are at high risk for heart disease. What should I know about cancer screening? Many types of cancers can be detected early and may often be prevented. Lung Cancer  You should be screened every year for lung cancer if:  You are a current smoker who has smoked for at least 30 years.  You are a former smoker who has quit within the past 15 years.  Talk to your health care provider about your screening options, when you should start screening, and how often you should be screened. Colorectal Cancer  Routine colorectal cancer screening usually begins at 56 years of age and should be repeated every 5-10 years until you are 56 years old. You may need to be screened more often if early forms of precancerous polyps or small growths are found. Your health care provider may recommend screening at an earlier age if you have risk factors for colon cancer.  Your health care provider may recommend using home test kits to check for hidden blood in the stool.  A small camera at the end of a tube can be used to examine your colon (sigmoidoscopy or colonoscopy). This checks for the earliest forms of colorectal cancer. Prostate and Testicular Cancer  Depending on your age and overall health, your health care provider may do certain tests to screen for prostate and testicular cancer.  Talk to your health care provider about any symptoms or concerns you have about testicular or prostate cancer. Skin Cancer  Check your skin from head   to toe regularly.  Tell your health care provider about any new moles or changes in moles, especially if:  There is a change in a mole's size, shape, or color.  You have a mole that is larger than a pencil eraser.  Always use sunscreen. Apply sunscreen liberally and repeat throughout the day.  Protect yourself by wearing long sleeves, pants, a wide-brimmed hat, and sunglasses when outside. What should I know about heart disease, diabetes, and high blood  pressure?  If you are 18-39 years of age, have your blood pressure checked every 3-5 years. If you are 40 years of age or older, have your blood pressure checked every year. You should have your blood pressure measured twice-once when you are at a hospital or clinic, and once when you are not at a hospital or clinic. Record the average of the two measurements. To check your blood pressure when you are not at a hospital or clinic, you can use:  An automated blood pressure machine at a pharmacy.  A home blood pressure monitor.  Talk to your health care provider about your target blood pressure.  If you are between 45-79 years old, ask your health care provider if you should take aspirin to prevent heart disease.  Have regular diabetes screenings by checking your fasting blood sugar level.  If you are at a normal weight and have a low risk for diabetes, have this test once every three years after the age of 45.  If you are overweight and have a high risk for diabetes, consider being tested at a younger age or more often.  A one-time screening for abdominal aortic aneurysm (AAA) by ultrasound is recommended for men aged 65-75 years who are current or former smokers. What should I know about preventing infection? Hepatitis B  If you have a higher risk for hepatitis B, you should be screened for this virus. Talk with your health care provider to find out if you are at risk for hepatitis B infection. Hepatitis C  Blood testing is recommended for:  Everyone born from 1945 through 1965.  Anyone with known risk factors for hepatitis C. Sexually Transmitted Diseases (STDs)  You should be screened each year for STDs including gonorrhea and chlamydia if:  You are sexually active and are younger than 56 years of age.  You are older than 56 years of age and your health care provider tells you that you are at risk for this type of infection.  Your sexual activity has changed since you were last  screened and you are at an increased risk for chlamydia or gonorrhea. Ask your health care provider if you are at risk.  Talk with your health care provider about whether you are at high risk of being infected with HIV. Your health care provider may recommend a prescription medicine to help prevent HIV infection. What else can I do?  Schedule regular health, dental, and eye exams.  Stay current with your vaccines (immunizations).  Do not use any tobacco products, such as cigarettes, chewing tobacco, and e-cigarettes. If you need help quitting, ask your health care provider.  Limit alcohol intake to no more than 2 drinks per day. One drink equals 12 ounces of beer, 5 ounces of wine, or 1 ounces of hard liquor.  Do not use street drugs.  Do not share needles.  Ask your health care provider for help if you need support or information about quitting drugs.  Tell your health care provider if you   often feel depressed.  Tell your health care provider if you have ever been abused or do not feel safe at home. This information is not intended to replace advice given to you by your health care provider. Make sure you discuss any questions you have with your health care provider. Document Released: 03/01/2008 Document Revised: 05/02/2016 Document Reviewed: 06/07/2015 Elsevier Interactive Patient Education  2017 [place] and Cholesterol Restricted Diet Getting too much fat and cholesterol in your diet may cause health problems. Following this diet helps keep your fat and cholesterol at normal levels. This can keep you from getting sick. What types of fat should I choose?  Choose monosaturated and polyunsaturated fats. These are found in foods such as olive oil, canola oil, flaxseeds, walnuts, almonds, and seeds.  Eat more omega-3 fats. Good choices include salmon, mackerel, sardines, tuna, flaxseed oil, and ground flaxseeds.  Limit saturated fats. These are in animal products such  as meats, butter, and cream. They can also be in plant products such as palm oil, palm kernel oil, and coconut oil.  Avoid foods with partially hydrogenated oils in them. These contain trans fats. Examples of foods that have trans fats are stick margarine, some tub margarines, cookies, crackers, and other baked goods. What general guidelines do I need to follow?  Check food labels. Look for the words "trans fat" and "saturated fat."  When preparing a meal:  Fill half of your plate with vegetables and green salads.  Fill one fourth of your plate with whole grains. Look for the word "whole" as the first word in the ingredient list.  Fill one fourth of your plate with lean protein foods.  Eat more foods that have fiber, like apples, carrots, beans, peas, and barley.  Eat more home-cooked foods. Eat less at restaurants and buffets.  Limit or avoid alcohol.  Limit foods high in starch and sugar.  Limit fried foods.  Cook foods without frying them. Baking, boiling, grilling, and broiling are all great options.  Lose weight if you are overweight. Losing even a small amount of weight can help your overall health. It can also help prevent diseases such as diabetes and heart disease. What foods can I eat? Grains  Whole grains, such as whole wheat or whole grain breads, crackers, cereals, and pasta. Unsweetened oatmeal, bulgur, barley, quinoa, or brown rice. Corn or whole wheat flour tortillas. Vegetables  Fresh or frozen vegetables (raw, steamed, roasted, or grilled). Green salads. Fruits  All fresh, canned (in natural juice), or frozen fruits. Meat and Other Protein Products  Ground beef (85% or leaner), grass-fed beef, or beef trimmed of fat. Skinless chicken or [place]. Ground chicken or [place]. Pork trimmed of fat. All fish and seafood. Eggs. Dried beans, peas, or lentils. Unsalted nuts or seeds. Unsalted canned or dry beans. Dairy  Low-fat dairy products, such as skim or 1% milk, 2%  or reduced-fat cheeses, low-fat ricotta or cottage cheese, or plain low-fat yogurt. Fats and Oils  Tub margarines without trans fats. Light or reduced-fat mayonnaise and salad dressings. Avocado. Olive, canola, sesame, or safflower oils. Natural peanut or almond butter (choose ones without added sugar and oil). The items listed above may not be a complete list of recommended foods or beverages. Contact your dietitian for more options.  What foods are not recommended? Grains  White bread. White pasta. White rice. Cornbread. Bagels, pastries, and croissants. Crackers that contain trans fat. Vegetables  White potatoes. Corn. Creamed or fried vegetables. Vegetables  in a cheese sauce. Fruits  Dried fruits. Canned fruit in light or heavy syrup. Fruit juice. Meat and Other Protein Products  Fatty cuts of meat. Ribs, chicken wings, bacon, sausage, bologna, salami, chitterlings, fatback, hot dogs, bratwurst, and packaged luncheon meats. Liver and organ meats. Dairy  Whole or 2% milk, cream, half-and-half, and cream cheese. Whole milk cheeses. Whole-fat or sweetened yogurt. Full-fat cheeses. Nondairy creamers and whipped toppings. Processed cheese, cheese spreads, or cheese curds. Sweets and Desserts  Corn syrup, sugars, honey, and molasses. Candy. Jam and jelly. Syrup. Sweetened cereals. Cookies, pies, cakes, donuts, muffins, and ice cream. Fats and Oils  Butter, stick margarine, lard, shortening, ghee, or bacon fat. Coconut, palm kernel, or palm oils. Beverages  Alcohol. Sweetened drinks (such as sodas, lemonade, and fruit drinks or punches). The items listed above may not be a complete list of foods and beverages to avoid. Contact your dietitian for more information.  This information is not intended to replace advice given to you by your health care provider. Make sure you discuss any questions you have with your health care provider. Document Released: 03/04/2012 Document Revised: 05/10/2016  Document Reviewed: 12/03/2013 Elsevier Interactive Patient Education  2017 [place] [facility]) Exercise Recommendation  Being physically active is important to prevent heart disease and stroke, the nation's No. 1and No. 5killers. To improve overall cardiovascular health, we suggest at least 150 minutes per week of moderate exercise or 75 minutes per week of vigorous exercise (or a combination of moderate and vigorous activity). Thirty minutes a day, five times a week is an easy goal to remember. You will also experience benefits even if you divide your time into two or three segments of 10 to 15 minutes per day.  For people who would benefit from lowering their blood pressure or cholesterol, we recommend 40 minutes of aerobic exercise of moderate to vigorous intensity three to four times a week to lower the risk for heart attack and stroke.  Physical activity is anything that makes you move your body and burn calories.  This includes things like climbing stairs or playing sports. Aerobic exercises benefit your heart, and include walking, jogging, swimming or biking. Strength and stretching exercises are best for overall stamina and flexibility.  The simplest, positive change you can make to effectively improve your heart health is to start walking. It's enjoyable, free, easy, social and great exercise. A walking program is flexible and boasts high success rates because people can stick with it. It's easy for walking to become a regular and satisfying part of life.   For Overall Cardiovascular Health:  At least 30 minutes of moderate-intensity aerobic activity at least 5 days per week for a total of 150  OR   At least 25 minutes of vigorous aerobic activity at least 3 days per week for a total of 75 minutes; or a combination of moderate- and vigorous-intensity aerobic activity  AND   Moderate- to high-intensity muscle-strengthening activity at least 2 days per  week for additional health benefits.  For Lowering Blood Pressure and Cholesterol  An average 40 minutes of moderate- to vigorous-intensity aerobic activity 3 or 4 times per week  What if I can't make it to the time goal? Something is always better than nothing! And everyone has to start somewhere. Even if you've been sedentary for years, today is the day you can begin to make healthy changes in your life. If you don't think you'll make it for 30 or  40 minutes, set a reachable goal for today. You can work up toward your overall goal by increasing your time as you get stronger. Don't let all-or-nothing thinking rob you of doing what you can every day.  Source:http://www.heart.org

## 2016-12-27 NOTE — Progress Notes (Signed)
Christopher [name] Colon 56 y.o.   Chief Complaint  Patient presents with  . Annual Exam    HISTORY OF PRESENT ILLNESS: This is a 56 y.o. male here for annual exam; has no complaints.  HPI   Prior to Admission medications   Not on File    Allergies  Allergen Reactions  . Asa Arthritis Strength-Antacid [Aspirin Buffered]   . Penicillins   . Sulfa Antibiotics     Patient Active Problem List   Diagnosis Date Noted  . Influenza with respiratory manifestation other than pneumonia 11/08/2016  . Family history of premature CAD 01/13/2016  . Hyperlipidemia 01/13/2016  . Testosterone deficiency 05/07/2014  . Hearing impaired 12/17/2013  . GERD (gastroesophageal reflux disease) 10/31/2011  . Hypogonadism male 10/31/2011    Past Medical History:  Diagnosis Date  . GERD (gastroesophageal reflux disease) 10/31/2011  . Hearing impaired 12/17/2013   lifelong   . 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 TONSILLECTOMY      Social History   Social History  . Marital status: Married    Spouse name: N/A  . Number of children: N/A  . Years of education: N/A   Occupational History  . War History Main Topics  . Smoking status: Never Smoker  . Smokeless tobacco: Never Used  . Alcohol use No  . Drug use: No  . Sexual activity: Yes   Other Topics Concern  . Not on file   Social History Narrative   Married.  Education: Grade School.    Family History  Problem Relation Age of Onset  . Cancer Father   . Hyperlipidemia Father   . Heart disease Mother   . Hyperlipidemia Mother      Review of Systems  Constitutional: Negative.  Negative for fever and weight loss.  HENT: Positive for hearing loss (chronic). Negative for congestion, ear pain, nosebleeds and sore throat.   Eyes: Negative for blurred vision, double vision and pain.  Respiratory: Negative.  Negative for cough, hemoptysis,  shortness of breath and wheezing.   Cardiovascular: Positive for palpitations (asymptomatic PVC's in past). Negative for chest pain, claudication and leg swelling.       History of PVC's  Gastrointestinal: Negative.  Negative for abdominal pain, blood in stool, constipation, diarrhea, melena, nausea and vomiting.  Genitourinary: Negative.  Negative for dysuria, flank pain, frequency, hematuria and urgency.       Has to push harder to urinate  Musculoskeletal: Negative.  Negative for back pain, joint pain, myalgias and neck pain.  Skin: Negative for rash.  Neurological: Negative for dizziness, sensory change, focal weakness and headaches.  Endo/Heme/Allergies: Negative.        Low testosterone Unable to lose weight as before  All other systems reviewed and are negative.  Vitals:   12/27/16 0814  BP: 113/63  Pulse: 64  Resp: 17  Temp: 98.1 F (36.7 C)      Physical Exam  Constitutional: He is oriented to person, place, and time. He appears well-developed and well-nourished.  HENT:  Head: Normocephalic and atraumatic.  Right Ear: Tympanic membrane normal.  Left Ear: Tympanic membrane normal.  Nose: Nose normal.  Mouth/Throat: Oropharynx is clear and moist. No oropharyngeal exudate.  Eyes: Conjunctivae and EOM are normal. Pupils are equal, round, and reactive to light.  Neck: Normal range of motion. Neck supple. No JVD present. No thyromegaly present.  Cardiovascular: Normal rate, regular rhythm, normal  heart sounds and intact distal pulses.   Pulmonary/Chest: Effort normal and breath sounds normal.  Abdominal: Soft. Bowel sounds are normal. He exhibits no distension. There is no tenderness.  Musculoskeletal: Normal range of motion.  Back: WNL Extrem: WNL  Lymphadenopathy:    He has no cervical adenopathy.  Neurological: He is alert and oriented to person, place, and time. No sensory deficit. He exhibits normal muscle tone. Coordination normal.  Skin: Skin is warm and dry.  Capillary refill takes less than 2 seconds.  Psychiatric: He has a normal mood and affect. His behavior is normal.  Vitals reviewed.  EKG: NSR; no acute ischemic changes.  ASSESSMENT & PLAN: Mamie was seen today for annual exam.  Diagnoses and all orders for this visit:  Routine general medical examination at a health care facility -     CBC with Differential -     Comprehensive metabolic panel -     Hemoglobin A1c -     Lipid panel -     PSA(Must document that pt has been informed of limitations of PSA testing.) -     TSH -     HIV antibody -     EKG 12-Lead -     POCT urinalysis dipstick -     TestT+TestF+SHBG  PVC (premature ventricular contraction) Comments: history of  Low testosterone in male Comments: History of  Orders: -     Ambulatory referral to Urology     Patient Instructions       IF you received an x-ray today, you will receive an invoice from [facility] Radiology. Please contact [facility] Radiology at [phone] with questions or concerns regarding your invoice.   IF you received labwork today, you will receive an invoice from [facility]. Please contact [facility] at [phone] with questions or concerns regarding your invoice.   Our billing staff will not be able to assist you with questions regarding bills from these companies.  You will be contacted with the lab results as soon as they are available. The fastest way to get your results is to activate your My Chart account. Instructions are located on the last page of this paperwork. If you have not heard from [place] regarding the results in 2 weeks, please contact this office.        Health Maintenance, Male A healthy lifestyle and preventive care is important for your health and wellness. Ask your health care provider about what schedule of regular examinations is right for you. What should I know about weight and diet?  Eat a Healthy Diet  Eat plenty of vegetables, fruits, whole grains,  low-fat dairy products, and lean protein.  Do not eat a lot of foods high in solid fats, added sugars, or salt. Maintain a Healthy Weight  Regular exercise can help you achieve or maintain a healthy weight. You should:  Do at least 150 minutes of exercise each week. The exercise should increase your heart rate and make you sweat (moderate-intensity exercise).  Do strength-training exercises at least twice a week. Watch Your Levels of Cholesterol and Blood Lipids  Have your blood tested for lipids and cholesterol every 5 years starting at 56 years of age. If you are at high risk for heart disease, you should start having your blood tested when you are 56 years old. You may need to have your cholesterol levels checked more often if:  Your lipid or cholesterol levels are high.  You are older than 56 years of age.  You are  at high risk for heart disease. What should I know about cancer screening? Many types of cancers can be detected early and may often be prevented. Lung Cancer  You should be screened every year for lung cancer if:  You are a current smoker who has smoked for at least 30 years.  You are a former smoker who has quit within the past 15 years.  Talk to your health care provider about your screening options, when you should start screening, and how often you should be screened. Colorectal Cancer  Routine colorectal cancer screening usually begins at 56 years of age and should be repeated every 5-10 years until you are 56 years old. You may need to be screened more often if early forms of precancerous polyps or small growths are found. Your health care provider may recommend screening at an earlier age if you have risk factors for colon cancer.  Your health care provider may recommend using home test kits to check for hidden blood in the stool.  A small camera at the end of a tube can be used to examine your colon (sigmoidoscopy or colonoscopy). This checks for the  earliest forms of colorectal cancer. Prostate and Testicular Cancer  Depending on your age and overall health, your health care provider may do certain tests to screen for prostate and testicular cancer.  Talk to your health care provider about any symptoms or concerns you have about testicular or prostate cancer. Skin Cancer  Check your skin from head to toe regularly.  Tell your health care provider about any new moles or changes in moles, especially if:  There is a change in a mole's size, shape, or color.  You have a mole that is larger than a pencil eraser.  Always use sunscreen. Apply sunscreen liberally and repeat throughout the day.  Protect yourself by wearing long sleeves, pants, a wide-brimmed hat, and sunglasses when outside. What should I know about heart disease, diabetes, and high blood pressure?  If you are 57-64 years of age, have your blood pressure checked every 3-5 years. If you are 40 years of age or older, have your blood pressure checked every year. You should have your blood pressure measured twice-once when you are at a hospital or clinic, and once when you are not at a hospital or clinic. Record the average of the two measurements. To check your blood pressure when you are not at a hospital or clinic, you can use:  An automated blood pressure machine at a pharmacy.  A home blood pressure monitor.  Talk to your health care provider about your target blood pressure.  If you are between 6-40 years old, ask your health care provider if you should take aspirin to prevent heart disease.  Have regular diabetes screenings by checking your fasting blood sugar level.  If you are at a normal weight and have a low risk for diabetes, have this test once every three years after the age of 76.  If you are overweight and have a high risk for diabetes, consider being tested at a younger age or more often.  A one-time screening for abdominal aortic aneurysm (AAA) by  ultrasound is recommended for men aged 54-75 years who are current or former smokers. What should I know about preventing infection? Hepatitis B  If you have a higher risk for hepatitis B, you should be screened for this virus. Talk with your health care provider to find out if you are at risk for hepatitis  B infection. Hepatitis C  Blood testing is recommended for:  Everyone born from [birth year] through 1965.  Anyone with known risk factors for hepatitis C. Sexually Transmitted Diseases (STDs)  You should be screened each year for STDs including gonorrhea and chlamydia if:  You are sexually active and are younger than 56 years of age.  You are older than 56 years of age and your health care provider tells you that you are at risk for this type of infection.  Your sexual activity has changed since you were last screened and you are at an increased risk for chlamydia or gonorrhea. Ask your health care provider if you are at risk.  Talk with your health care provider about whether you are at high risk of being infected with HIV. Your health care provider may recommend a prescription medicine to help prevent HIV infection. What else can I do?  Schedule regular health, dental, and eye exams.  Stay current with your vaccines (immunizations).  Do not use any tobacco products, such as cigarettes, chewing tobacco, and e-cigarettes. If you need help quitting, ask your health care provider.  Limit alcohol intake to no more than 2 drinks per day. One drink equals 12 ounces of beer, 5 ounces of wine, or 1 ounces of hard liquor.  Do not use street drugs.  Do not share needles.  Ask your health care provider for help if you need support or information about quitting drugs.  Tell your health care provider if you often feel depressed.  Tell your health care provider if you have ever been abused or do not feel safe at home. This information is not intended to replace advice given to you by your  health care provider. Make sure you discuss any questions you have with your health care provider. Document Released: 03/01/2008 Document Revised: 05/02/2016 Document Reviewed: 06/07/2015 Elsevier Interactive Patient Education  2017 [place] and Cholesterol Restricted Diet Getting too much fat and cholesterol in your diet may cause health problems. Following this diet helps keep your fat and cholesterol at normal levels. This can keep you from getting sick. What types of fat should I choose?  Choose monosaturated and polyunsaturated fats. These are found in foods such as olive oil, canola oil, flaxseeds, walnuts, almonds, and seeds.  Eat more omega-3 fats. Good choices include salmon, mackerel, sardines, tuna, flaxseed oil, and ground flaxseeds.  Limit saturated fats. These are in animal products such as meats, butter, and cream. They can also be in plant products such as palm oil, palm kernel oil, and coconut oil.  Avoid foods with partially hydrogenated oils in them. These contain trans fats. Examples of foods that have trans fats are stick margarine, some tub margarines, cookies, crackers, and other baked goods. What general guidelines do I need to follow?  Check food labels. Look for the words "trans fat" and "saturated fat."  When preparing a meal:  Fill half of your plate with vegetables and green salads.  Fill one fourth of your plate with whole grains. Look for the word "whole" as the first word in the ingredient list.  Fill one fourth of your plate with lean protein foods.  Eat more foods that have fiber, like apples, carrots, beans, peas, and barley.  Eat more home-cooked foods. Eat less at restaurants and buffets.  Limit or avoid alcohol.  Limit foods high in starch and sugar.  Limit fried foods.  Cook foods without frying them. Baking, boiling, grilling, and broiling are  all great options.  Lose weight if you are overweight. Losing even a small amount  of weight can help your overall health. It can also help prevent diseases such as diabetes and heart disease. What foods can I eat? Grains  Whole grains, such as whole wheat or whole grain breads, crackers, cereals, and pasta. Unsweetened oatmeal, bulgur, barley, quinoa, or brown rice. Corn or whole wheat flour tortillas. Vegetables  Fresh or frozen vegetables (raw, steamed, roasted, or grilled). Green salads. Fruits  All fresh, canned (in natural juice), or frozen fruits. Meat and Other Protein Products  Ground beef (85% or leaner), grass-fed beef, or beef trimmed of fat. Skinless chicken or [place]. Ground chicken or [place]. Pork trimmed of fat. All fish and seafood. Eggs. Dried beans, peas, or lentils. Unsalted nuts or seeds. Unsalted canned or dry beans. Dairy  Low-fat dairy products, such as skim or 1% milk, 2% or reduced-fat cheeses, low-fat ricotta or cottage cheese, or plain low-fat yogurt. Fats and Oils  Tub margarines without trans fats. Light or reduced-fat mayonnaise and salad dressings. Avocado. Olive, canola, sesame, or safflower oils. Natural peanut or almond butter (choose ones without added sugar and oil). The items listed above may not be a complete list of recommended foods or beverages. Contact your dietitian for more options.  What foods are not recommended? Grains  White bread. White pasta. White rice. Cornbread. Bagels, pastries, and croissants. Crackers that contain trans fat. Vegetables  White potatoes. Corn. Creamed or fried vegetables. Vegetables in a cheese sauce. Fruits  Dried fruits. Canned fruit in light or heavy syrup. Fruit juice. Meat and Other Protein Products  Fatty cuts of meat. Ribs, chicken wings, bacon, sausage, bologna, salami, chitterlings, fatback, hot dogs, bratwurst, and packaged luncheon meats. Liver and organ meats. Dairy  Whole or 2% milk, cream, half-and-half, and cream cheese. Whole milk cheeses. Whole-fat or sweetened yogurt. Full-fat  cheeses. Nondairy creamers and whipped toppings. Processed cheese, cheese spreads, or cheese curds. Sweets and Desserts  Corn syrup, sugars, honey, and molasses. Candy. Jam and jelly. Syrup. Sweetened cereals. Cookies, pies, cakes, donuts, muffins, and ice cream. Fats and Oils  Butter, stick margarine, lard, shortening, ghee, or bacon fat. Coconut, palm kernel, or palm oils. Beverages  Alcohol. Sweetened drinks (such as sodas, lemonade, and fruit drinks or punches). The items listed above may not be a complete list of foods and beverages to avoid. Contact your dietitian for more information.  This information is not intended to replace advice given to you by your health care provider. Make sure you discuss any questions you have with your health care provider. Document Released: 03/04/2012 Document Revised: 05/10/2016 Document Reviewed: 12/03/2013 Elsevier Interactive Patient Education  2017 [facility]) Exercise Recommendation  Being physically active is important to prevent heart disease and stroke, the nation's No. 1and No. 5killers. To improve overall cardiovascular health, we suggest at least 150 minutes per week of moderate exercise or 75 minutes per week of vigorous exercise (or a combination of moderate and vigorous activity). Thirty minutes a day, five times a week is an easy goal to remember. You will also experience benefits even if you divide your time into two or three segments of 10 to 15 minutes per day.  For people who would benefit from lowering their blood pressure or cholesterol, we recommend 40 minutes of aerobic exercise of moderate to vigorous intensity three to four times a week to lower the risk for heart attack and stroke.  Physical  activity is anything that makes you move your body and burn calories.  This includes things like climbing stairs or playing sports. Aerobic exercises benefit your heart, and include walking, jogging,  swimming or biking. Strength and stretching exercises are best for overall stamina and flexibility.  The simplest, positive change you can make to effectively improve your heart health is to start walking. It's enjoyable, free, easy, social and great exercise. A walking program is flexible and boasts high success rates because people can stick with it. It's easy for walking to become a regular and satisfying part of life.   For Overall Cardiovascular Health:  At least 30 minutes of moderate-intensity aerobic activity at least 5 days per week for a total of 150  OR   At least 25 minutes of vigorous aerobic activity at least 3 days per week for a total of 75 minutes; or a combination of moderate- and vigorous-intensity aerobic activity  AND   Moderate- to high-intensity muscle-strengthening activity at least 2 days per week for additional health benefits.  For Lowering Blood Pressure and Cholesterol  An average 40 minutes of moderate- to vigorous-intensity aerobic activity 3 or 4 times per week  What if I can't make it to the time goal? Something is always better than nothing! And everyone has to start somewhere. Even if you've been sedentary for years, today is the day you can begin to make healthy changes in your life. If you don't think you'll make it for 30 or 40 minutes, set a reachable goal for today. You can work up toward your overall goal by increasing your time as you get stronger. Don't let all-or-nothing thinking rob you of doing what you can every day.  Source:http://www.heart.Burnadette Pop, MD [facility]

## 2016-12-27 NOTE — Progress Notes (Signed)
x

## 2016-12-30 LAB — COMPREHENSIVE METABOLIC PANEL
ALBUMIN: 4.3 g/dL (ref 3.5–5.5)
ALT: 23 IU/L (ref 0–44)
AST: 29 IU/L (ref 0–40)
Albumin/Globulin Ratio: 1.4 (ref 1.2–2.2)
Alkaline Phosphatase: 80 IU/L (ref 39–117)
BUN/Creatinine Ratio: 15 (ref 9–20)
BUN: 16 mg/dL (ref 6–24)
Bilirubin Total: 0.6 mg/dL (ref 0.0–1.2)
CO2: 24 mmol/L (ref 18–29)
CREATININE: 1.06 mg/dL (ref 0.76–1.27)
Calcium: 9.6 mg/dL (ref 8.7–10.2)
Chloride: 103 mmol/L (ref 96–106)
GFR calc Af Amer: 91 mL/min/{1.73_m2} (ref >59)
GFR, EST NON AFRICAN AMERICAN: 79 mL/min/{1.73_m2} (ref >59)
GLOBULIN, TOTAL: 3.1 g/dL (ref 1.5–4.5)
Glucose: 98 mg/dL (ref 65–99)
Potassium: 4.4 mmol/L (ref 3.5–5.2)
SODIUM: 142 mmol/L (ref 134–144)
TOTAL PROTEIN: 7.4 g/dL (ref 6.0–8.5)

## 2016-12-30 LAB — CBC WITH DIFFERENTIAL/PLATELET
BASOS: 0 %
Basophils Absolute: 0 10*3/uL (ref 0.0–0.2)
EOS (ABSOLUTE): 0.3 10*3/uL (ref 0.0–0.4)
Eos: 5 %
HEMOGLOBIN: 14.1 g/dL (ref 13.0–17.7)
Hematocrit: 41.5 % (ref 37.5–51.0)
IMMATURE GRANS (ABS): 0 10*3/uL (ref 0.0–0.1)
Immature Granulocytes: 0 %
LYMPHS ABS: 1.8 10*3/uL (ref 0.7–3.1)
LYMPHS: 33 %
MCH: 28.3 pg (ref 26.6–33.0)
MCHC: 34 g/dL (ref 31.5–35.7)
MCV: 83 fL (ref 79–97)
MONOCYTES: 10 %
Monocytes Absolute: 0.6 10*3/uL (ref 0.1–0.9)
NEUTROS ABS: 2.9 10*3/uL (ref 1.4–7.0)
Neutrophils: 52 %
Platelets: 236 10*3/uL (ref 150–379)
RBC: 4.99 x10E6/uL (ref 4.14–5.80)
RDW: 13.6 % (ref 12.3–15.4)
WBC: 5.5 10*3/uL (ref 3.4–10.8)

## 2016-12-30 LAB — TESTT+TESTF+SHBG
Sex Hormone Binding: 28.4 nmol/L (ref 19.3–76.4)
Testosterone, Free: 7.1 pg/mL — ABNORMAL LOW (ref 7.2–24.0)
Testosterone, total: 362.2 ng/dL (ref 264.0–916.0)

## 2016-12-30 LAB — HEMOGLOBIN A1C
ESTIMATED AVERAGE GLUCOSE: 114 mg/dL
Hgb A1c MFr Bld: 5.6 % (ref 4.8–5.6)

## 2016-12-30 LAB — HIV ANTIBODY (ROUTINE TESTING W REFLEX): HIV SCREEN 4TH GENERATION: NONREACTIVE

## 2016-12-30 LAB — TSH: TSH: 1.32 u[IU]/mL (ref 0.450–4.500)

## 2016-12-30 LAB — LIPID PANEL
Chol/HDL Ratio: 4.4 ratio (ref 0.0–5.0)
Cholesterol, Total: 190 mg/dL (ref 100–199)
HDL: 43 mg/dL (ref >39)
LDL Calculated: 132 mg/dL — ABNORMAL HIGH (ref 0–99)
Triglycerides: 75 mg/dL (ref 0–149)
VLDL CHOLESTEROL CAL: 15 mg/dL (ref 5–40)

## 2016-12-30 LAB — PSA: Prostate Specific Ag, Serum: 1.1 ng/mL (ref 0.0–4.0)

## 2017-02-13 DIAGNOSIS — E291 Testicular hypofunction: Secondary | ICD-10-CM | POA: Diagnosis not present

## 2017-02-13 DIAGNOSIS — N401 Enlarged prostate with lower urinary tract symptoms: Secondary | ICD-10-CM | POA: Diagnosis not present

## 2017-02-14 DIAGNOSIS — E291 Testicular hypofunction: Secondary | ICD-10-CM | POA: Diagnosis not present

## 2017-03-04 DIAGNOSIS — E291 Testicular hypofunction: Secondary | ICD-10-CM | POA: Diagnosis not present

## 2017-03-04 DIAGNOSIS — N401 Enlarged prostate with lower urinary tract symptoms: Secondary | ICD-10-CM | POA: Diagnosis not present

## 2017-06-04 ENCOUNTER — Ambulatory Visit (INDEPENDENT_AMBULATORY_CARE_PROVIDER_SITE_OTHER): Payer: 59 | Admitting: Family Medicine

## 2017-06-04 ENCOUNTER — Encounter: Payer: Self-pay | Admitting: Family Medicine

## 2017-06-04 VITALS — BP 126/69 | HR 60 | Temp 98.0°F | Resp 16 | Ht 70.0 in | Wt 267.8 lb

## 2017-06-04 DIAGNOSIS — L03011 Cellulitis of right finger: Secondary | ICD-10-CM

## 2017-06-04 MED ORDER — DOXYCYCLINE HYCLATE 100 MG PO TABS: 100.0000 mg | ORAL_TABLET | Freq: Two times a day (BID) | ORAL | 0 refills | Status: DC

## 2017-06-04 NOTE — Patient Instructions (Addendum)
Start antibiotic one pill twice per day, warm compresses, and recheck if swelling is not improving within the next week to 10 days, or if worsening sooner.  Paronychia Paronychia is an infection of the skin that surrounds a nail. It usually affects the skin around a fingernail, but it may also occur near a toenail. It often causes pain and swelling around the nail. This condition may come on suddenly or develop over a longer period. In some cases, a collection of pus (abscess) can form near or under the nail. Usually, paronychia is not serious and it clears up with treatment. What are the causes? This condition may be caused by bacteria or fungi. It is commonly caused by either Streptococcus or Staphylococcus bacteria. The bacteria or fungi often cause the infection by getting into the affected area through an opening in the skin, such as a cut or a hangnail. What increases the risk? This condition is more likely to develop in:  People who get their hands wet often, such as those who work as Designer, industrial/product, bartenders, or nurses.  People who bite their fingernails or suck their thumbs.  People who trim their nails too short.  People who have hangnails or injured fingertips.  People who get manicures.  People who have diabetes.  What are the signs or symptoms? Symptoms of this condition include:  Redness and swelling of the skin near the nail.  Tenderness around the nail when you touch the area.  Pus-filled bumps under the cuticle. The cuticle is the skin at the base or sides of the nail.  Fluid or pus under the nail.  Throbbing pain in the area.  How is this diagnosed? This condition is usually diagnosed with a physical exam. In some cases, a sample of pus may be taken from an abscess to be tested in a lab. This can help to determine what type of bacteria or fungi is causing the condition. How is this treated? Treatment for this condition depends on the cause and severity of the  condition. If the condition is mild, it may clear up on its own in a few days. Your health care provider may recommend soaking the affected area in warm water a few times a day. When treatment is needed, the options may include:  Antibiotic medicine, if the condition is caused by a bacterial infection.  Antifungal medicine, if the condition is caused by a fungal infection.  Incision and drainage, if an abscess is present. In this procedure, the health care provider will cut open the abscess so the pus can drain out.  Follow these instructions at home:  Soak the affected area in warm water if directed to do so by your health care provider. You may be told to do this for 20 minutes, 2-3 times a day. Keep the area dry in between soakings.  Take medicines only as directed by your health care provider.  If you were prescribed an antibiotic medicine, finish all of it even if you start to feel better.  Keep the affected area clean.  Do not try to drain a fluid-filled bump yourself.  If you will be washing dishes or performing other tasks that require your hands to get wet, wear rubber gloves. You should also wear gloves if your hands might come in contact with irritating substances, such as cleaners or chemicals.  Follow your health care provider's instructions about: ? Wound care. ? Bandage (dressing) changes and removal. Contact a health care provider if:  Your symptoms  get worse or do not improve with treatment.  You have a fever or chills.  You have redness spreading from the affected area.  You have continued or increased fluid, blood, or pus coming from the affected area.  Your finger or knuckle becomes swollen or is difficult to move. This information is not intended to replace advice given to you by your health care provider. Make sure you discuss any questions you have with your health care provider. Document Released: 02/27/2001 Document Revised: 02/09/2016 Document Reviewed:  08/11/2014 Elsevier Interactive Patient Education  2018 [employer].    IF you received an x-ray today, you will receive an invoice from [facility] Radiology. Please contact [facility] Radiology at [phone] with questions or concerns regarding your invoice.   IF you received labwork today, you will receive an invoice from [place]. Please contact [facility] at [phone] with questions or concerns regarding your invoice.   Our billing staff will not be able to assist you with questions regarding bills from these companies.  You will be contacted with the lab results as soon as they are available. The fastest way to get your results is to activate your My Chart account. Instructions are located on the last page of this paperwork. If you have not heard from [place] regarding the results in 2 weeks, please contact this office.

## 2017-06-04 NOTE — Progress Notes (Signed)
Subjective:  This chart was scribed for Christopher Agreste, MD by [name], at [place] at [facility].  This patient was seen in room 12 and the patient's care was started at 11:00 AM.   Chief Complaint  Patient presents with  . Wound Infection    middle finger on right hand x 2 weeks      Patient ID: Christopher [name], male    DOB: 03/26/1961, 56 y.o.   MRN: [id number]  HPI HPI Comments: Christopher [name] is a 56 y.o. male who presents to Primary Care at [facility] for a soreness/infected area on his right middle finger onset two weeks ago.  He denies any draining from the area and it has not changed in size.  Denies fever/chills or rashes in any other areas.  He has been using neosporin. Patient is a Dance movement psychotherapist.    Patient Active Problem List   Diagnosis Date Noted  . Influenza with respiratory manifestation other than pneumonia 11/08/2016  . Family history of premature CAD 01/13/2016  . Hyperlipidemia 01/13/2016  . Low testosterone in male 05/07/2014  . PVC (premature ventricular contraction) 01/14/2014  . Hearing impaired 12/17/2013  . GERD (gastroesophageal reflux disease) 10/31/2011  . Hypogonadism male 10/31/2011   Past Medical History:  Diagnosis Date  . GERD (gastroesophageal reflux disease) 10/31/2011  . Hearing impaired 12/17/2013   lifelong   . Hypogonadism male 10/31/2011   Past Surgical History:  Procedure Laterality Date  . TONSILLECTOMY     Allergies  Allergen Reactions  . Asa Arthritis Strength-Antacid [Aspirin Buffered] Swelling  . Penicillins Hives  . Sulfa Antibiotics Hives   Prior to Admission medications   Not on File   Social History   Social History  . Marital status: Married    Spouse name: N/A  . Number of children: N/A  . Years of education: N/A   Occupational History  . [place] History Main Topics  . Smoking status: Never Smoker  . Smokeless tobacco: Never Used  .  Alcohol use No  . Drug use: No  . Sexual activity: Yes   Other Topics Concern  . Not on file   Social History Narrative   Married.  Education: Grade School.      Review of Systems  Constitutional: Negative for chills and fever.  Eyes: Negative for pain, redness and itching.  Gastrointestinal: Negative for nausea and vomiting.  Skin: Positive for wound.  Neurological: Negative for syncope and speech difficulty.       Objective:   Physical Exam  Constitutional: He is oriented to person, place, and time. He appears well-developed and well-nourished. No distress.  HENT:  Head: Normocephalic and atraumatic.  Pulmonary/Chest: Effort normal. No respiratory distress.  Neurological: He is alert and oriented to person, place, and time.  Skin:  Small amount of erythema and edema of the right radial proximal nail fold of the third finger. I do not feel any apparent fluctuance, no discharge with palpation.   Psychiatric: He has a normal mood and affect.    Vitals:   06/04/17 1014 06/04/17 1110  BP: (!) 155/76 126/69  Pulse: 60   Resp: 16   Temp: 98 F (36.7 C)   SpO2: 99%   Weight: 267 lb 12.8 oz (121.5 kg)   Height: 5\' 10"  (1.778 m)          Assessment & Plan:   [name] [name] is a 56 y.o. male  Paronychia of finger of right hand - Plan: doxycycline (VIBRA-TABS) 100 MG tablet  Suspected early/mild paronychia of middle finger of right hand. No apparent fluctuance or exudate at present. Start with warm compresses, doxycycline 100 mg twice a day, potential side effects discussed, recheck in the next week to 10 days if not improving, sooner if increasing swelling for possible incision/drainage.   Meds ordered this encounter  Medications  . doxycycline (VIBRA-TABS) 100 MG tablet    Sig: Take 1 tablet (100 mg total) by mouth 2 (two) times daily.    Dispense:  20 tablet    Refill:  0   Patient Instructions   Start antibiotic one pill twice per day, warm  compresses, and recheck if swelling is not improving within the next week to 10 days, or if worsening sooner.  Paronychia Paronychia is an infection of the skin that surrounds a nail. It usually affects the skin around a fingernail, but it may also occur near a toenail. It often causes pain and swelling around the nail. This condition may come on suddenly or develop over a longer period. In some cases, a collection of pus (abscess) can form near or under the nail. Usually, paronychia is not serious and it clears up with treatment. What are the causes? This condition may be caused by bacteria or fungi. It is commonly caused by either Streptococcus or Staphylococcus bacteria. The bacteria or fungi often cause the infection by getting into the affected area through an opening in the skin, such as a cut or a hangnail. What increases the risk? This condition is more likely to develop in:  People who get their hands wet often, such as those who work as Designer, industrial/product, bartenders, or nurses.  People who bite their fingernails or suck their thumbs.  People who trim their nails too short.  People who have hangnails or injured fingertips.  People who get manicures.  People who have diabetes.  What are the signs or symptoms? Symptoms of this condition include:  Redness and swelling of the skin near the nail.  Tenderness around the nail when you touch the area.  Pus-filled bumps under the cuticle. The cuticle is the skin at the base or sides of the nail.  Fluid or pus under the nail.  Throbbing pain in the area.  How is this diagnosed? This condition is usually diagnosed with a physical exam. In some cases, a sample of pus may be taken from an abscess to be tested in a lab. This can help to determine what type of bacteria or fungi is causing the condition. How is this treated? Treatment for this condition depends on the cause and severity of the condition. If the condition is mild, it may clear  up on its own in a few days. Your health care provider may recommend soaking the affected area in warm water a few times a day. When treatment is needed, the options may include:  Antibiotic medicine, if the condition is caused by a bacterial infection.  Antifungal medicine, if the condition is caused by a fungal infection.  Incision and drainage, if an abscess is present. In this procedure, the health care provider will cut open the abscess so the pus can drain out.  Follow these instructions at home:  Soak the affected area in warm water if directed to do so by your health care provider. You may be told to do this for 20 minutes, 2-3 times a day. Keep the area dry in between soakings.  Take medicines only as directed by your health care provider.  If you were prescribed an antibiotic medicine, finish all of it even if you start to feel better.  Keep the affected area clean.  Do not try to drain a fluid-filled bump yourself.  If you will be washing dishes or performing other tasks that require your hands to get wet, wear rubber gloves. You should also wear gloves if your hands might come in contact with irritating substances, such as cleaners or chemicals.  Follow your health care provider's instructions about: ? Wound care. ? Bandage (dressing) changes and removal. Contact a health care provider if:  Your symptoms get worse or do not improve with treatment.  You have a fever or chills.  You have redness spreading from the affected area.  You have continued or increased fluid, blood, or pus coming from the affected area.  Your finger or knuckle becomes swollen or is difficult to move. This information is not intended to replace advice given to you by your health care provider. Make sure you discuss any questions you have with your health care provider. Document Released: 02/27/2001 Document Revised: 02/09/2016 Document Reviewed: 08/11/2014 Elsevier Interactive Patient Education   2018 [employer].    IF you received an x-ray today, you will receive an invoice from [facility]. Please contact [facility] at [phone] with questions or concerns regarding your invoice.   IF you received labwork today, you will receive an invoice from [facility]. Please contact [facility] at [phone] with questions or concerns regarding your invoice.   Our billing staff will not be able to assist you with questions regarding bills from these companies.  You will be contacted with the lab results as soon as they are available. The fastest way to get your results is to activate your My Chart account. Instructions are located on the last page of this paperwork. If you have not heard from [place] regarding the results in 2 weeks, please contact this office.       I personally performed the services described in this documentation, which was scribed in my presence. The recorded information has been reviewed and considered for accuracy and completeness, addended by me as needed, and agree with information above.  Signed,   Merri Ray, MD Primary [facility] at [place].  06/04/17 11:11 AM

## 2017-06-18 DIAGNOSIS — M25561 Pain in right knee: Secondary | ICD-10-CM | POA: Diagnosis not present

## 2017-06-18 DIAGNOSIS — M25562 Pain in left knee: Secondary | ICD-10-CM | POA: Diagnosis not present

## 2017-06-26 DIAGNOSIS — M25561 Pain in right knee: Secondary | ICD-10-CM | POA: Diagnosis not present

## 2017-06-26 DIAGNOSIS — M2241 Chondromalacia patellae, right knee: Secondary | ICD-10-CM | POA: Diagnosis not present

## 2017-07-02 DIAGNOSIS — M25561 Pain in right knee: Secondary | ICD-10-CM | POA: Diagnosis not present

## 2017-07-04 ENCOUNTER — Encounter: Payer: Self-pay | Admitting: *Deleted

## 2017-07-08 DIAGNOSIS — M25561 Pain in right knee: Secondary | ICD-10-CM | POA: Diagnosis not present

## 2017-07-08 DIAGNOSIS — M2241 Chondromalacia patellae, right knee: Secondary | ICD-10-CM | POA: Diagnosis not present

## 2017-07-15 ENCOUNTER — Other Ambulatory Visit: Payer: 59

## 2017-07-15 ENCOUNTER — Other Ambulatory Visit: Payer: Self-pay | Admitting: Nurse Practitioner

## 2017-07-15 DIAGNOSIS — Z13220 Encounter for screening for lipoid disorders: Secondary | ICD-10-CM

## 2017-07-15 DIAGNOSIS — M25561 Pain in right knee: Secondary | ICD-10-CM | POA: Diagnosis not present

## 2017-07-15 DIAGNOSIS — M2241 Chondromalacia patellae, right knee: Secondary | ICD-10-CM | POA: Diagnosis not present

## 2017-07-15 LAB — LIPID PANEL
Chol/HDL Ratio: 4.3 ratio (ref 0.0–5.0)
Cholesterol, Total: 210 mg/dL — ABNORMAL HIGH (ref 100–199)
HDL: 49 mg/dL (ref >39)
LDL Calculated: 146 mg/dL — ABNORMAL HIGH (ref 0–99)
Triglycerides: 76 mg/dL (ref 0–149)
VLDL Cholesterol Cal: 15 mg/dL (ref 5–40)

## 2017-07-15 LAB — COMPREHENSIVE METABOLIC PANEL
ALT: 25 IU/L (ref 0–44)
AST: 30 IU/L (ref 0–40)
Albumin/Globulin Ratio: 1.6 (ref 1.2–2.2)
Albumin: 4.4 g/dL (ref 3.5–5.5)
Alkaline Phosphatase: 83 IU/L (ref 39–117)
BUN/Creatinine Ratio: 17 (ref 9–20)
BUN: 19 mg/dL (ref 6–24)
Bilirubin Total: 0.6 mg/dL (ref 0.0–1.2)
CO2: 23 mmol/L (ref 20–29)
Calcium: 9.1 mg/dL (ref 8.7–10.2)
Chloride: 102 mmol/L (ref 96–106)
Creatinine, Ser: 1.15 mg/dL (ref 0.76–1.27)
GFR calc Af Amer: 82 mL/min/{1.73_m2} (ref >59)
GFR calc non Af Amer: 71 mL/min/{1.73_m2} (ref >59)
Globulin, Total: 2.8 g/dL (ref 1.5–4.5)
Glucose: 99 mg/dL (ref 65–99)
Potassium: 4.6 mmol/L (ref 3.5–5.2)
Sodium: 140 mmol/L (ref 134–144)
Total Protein: 7.2 g/dL (ref 6.0–8.5)

## 2017-07-18 ENCOUNTER — Encounter: Payer: Self-pay | Admitting: Cardiology

## 2017-07-18 ENCOUNTER — Ambulatory Visit (INDEPENDENT_AMBULATORY_CARE_PROVIDER_SITE_OTHER): Payer: 59 | Admitting: Cardiology

## 2017-07-18 VITALS — BP 132/76 | HR 74 | Ht 70.0 in | Wt 267.0 lb

## 2017-07-18 DIAGNOSIS — E785 Hyperlipidemia, unspecified: Secondary | ICD-10-CM

## 2017-07-18 DIAGNOSIS — Z8249 Family history of ischemic heart disease and other diseases of the circulatory system: Secondary | ICD-10-CM | POA: Diagnosis not present

## 2017-07-18 DIAGNOSIS — R002 Palpitations: Secondary | ICD-10-CM | POA: Diagnosis not present

## 2017-07-18 NOTE — Patient Instructions (Signed)
Medication Instructions:   Your physician recommends that you continue on your current medications as directed. Please refer to the Current Medication list given to you today.   Labwork:  2 MONTHS TO CHECK YOUR LIPIDS---PLEASE COME FASTING TO THIS LAB APPOINTMENT      Follow-Up:  3 MONTHS WITH DR Meda Coffee       If you need a refill on your cardiac medications before your next appointment, please call your pharmacy.

## 2017-07-18 NOTE — Progress Notes (Signed)
Patient ID: Christopher [name] Christopher [name], male   DOB: 1960-10-05, 56 y.o.   MRN: [id number]    Patient Name: Christopher [name] Christopher [name] Date of Encounter: 07/18/2017  Primary Care Provider:  Patient, No Pcp Per Primary Cardiologist: Christopher [name]  Problem List   Past Medical History:  Diagnosis Date  . GERD (gastroesophageal reflux disease) 10/31/2011  . Hearing impaired 12/17/2013   lifelong   . Hypogonadism male 10/31/2011   Past Surgical History:  Procedure Laterality Date  . TONSILLECTOMY     Allergies  Allergies  Allergen Reactions  . Asa Arthritis Strength-Antacid [Aspirin Buffered] Swelling  . Penicillins Hives  . Sulfa Antibiotics Hives   HPI  Pleasant 56 year old gentleman originally from [place] who is coming with concerns of palpitations and shortness of breath. The patient is generally very healthy and has no prior medical history not significant family history of premature coronary artery disease. 3 of his cousins at his age has known coronary artery disease 2 of them underwent coronary artery bypass surgery and one of them passed away from heart attack. The patient exercises by lifting weights with no significant symptoms of shortness of breath or chest pain. He experiences occasional fluttering lasting few seconds but associated with significant shortness of breath. On one occasion he went to the emergency room and was found to have symptomatic PVCs. His concern is that his cousins have similar symptoms. He has never smoked. He denies any prior episode of syncope, orthopnea, paroxysmal nocturnal dyspnea, or lower extremity edema.  01/13/2016  - the patient is coming after one year, he states that he overall feels okay, denies any palpitations, however he has been noticing presyncopal episodes, but no syncope. It is not related to the certain time of the day or activity but he doesn't have any of these episodes during exercise. He works as a Dance movement psychotherapist and lately hasn't  been exercising much. He was using red yeast Rice as instructed but only for a month or 2 and then he quit. He states that his diet is far from being good.  02/23/2016, the patient is coming after 6 weeks, he states that he symptoms of presyncope have resolved, he has started regular exercise and is also focusing more on his diet generally feels much better. He underwent an echocardiogram that was completely normal with normal systolic diastolic function. He also had a Holter monitor during which had one episode of presyncope however there were no pauses no arrhythmias and only few PVCs and PACs. He hasn't started taking red yeast rice yet as he wanted to focus on diet first.  July 18, 2017 -this is 1 year follow-up, patient has occasional chest pain there are left-sided at the lower portion of his sternum sharp few seconds lasting not associated with shortness of breath.  Those are always at rest.  He goes to gym frequently and lifts heavy weight, he is not experiencing any chest pain or shortness of breath at that time.  He has not been great with his diet and there is room for improvement.  He denies any dizziness or syncope.  He has occasional palpitations however no falls.  Home Medications  Prior to Admission medications   Not on File   Family History  Family History  Problem Relation Age of Onset  . Cancer Father   . Hyperlipidemia Father   . Heart disease Mother   . Hyperlipidemia Mother    Social History  Social History   Social History  .  Marital status: Married    Spouse name: N/A  . Number of children: N/A  . Years of education: N/A   Occupational History  . [place] History Main Topics  . Smoking status: Never Smoker  . Smokeless tobacco: Never Used  . Alcohol use No  . Drug use: No  . Sexual activity: Yes   Other Topics Concern  . Not on file   Social History Narrative   Married.  Education: Grade School.     Review  of Systems, as per HPI, otherwise negative General:  No chills, fever, night sweats or weight changes.  Cardiovascular:  No chest pain, dyspnea on exertion, edema, orthopnea, palpitations, paroxysmal nocturnal dyspnea. Dermatological: No rash, lesions/masses Respiratory: No cough, dyspnea Urologic: No hematuria, dysuria Abdominal:   No nausea, vomiting, diarrhea, bright red blood per rectum, melena, or hematemesis Neurologic:  No visual changes, wkns, changes in mental status. All other systems reviewed and are otherwise negative except as noted above.  Physical Exam  Blood pressure 132/76, pulse 74, height 5\' 10"  (1.778 m), weight 267 lb (121.1 kg), SpO2 97 %.  General: Pleasant, NAD Psych: Normal affect. Neuro: Alert and oriented X 3. Moves all extremities spontaneously. HEENT: Normal  Neck: Supple without bruits or JVD. Lungs:  Resp regular and unlabored, CTA. Heart: RRR no s3, s4, or murmurs. Abdomen: Soft, non-tender, non-distended, BS + x 4.  Extremities: No clubbing, cyanosis or edema. DP/PT/Radials 2+ and equal bilaterally.  Labs:  No results for input(s): CKTOTAL, CKMB, TROPONINI in the last 72 hours. Lab Results  Component Value Date   WBC 5.5 12/27/2016   HGB 14.1 12/27/2016   HCT 41.5 12/27/2016   MCV 83 12/27/2016   PLT 236 12/27/2016    No results found for: DDIMER Invalid input(s): POCBNP    Component Value Date/Time   NA 140 07/15/2017 0900   K 4.6 07/15/2017 0900   CL 102 07/15/2017 0900   CO2 23 07/15/2017 0900   GLUCOSE 99 07/15/2017 0900   GLUCOSE 105 (H) 12/27/2015 0859   BUN 19 07/15/2017 0900   CREATININE 1.15 07/15/2017 0900   CREATININE 1.06 12/27/2015 0859   CALCIUM 9.1 07/15/2017 0900   PROT 7.2 07/15/2017 0900   ALBUMIN 4.4 07/15/2017 0900   AST 30 07/15/2017 0900   ALT 25 07/15/2017 0900   ALKPHOS 83 07/15/2017 0900   BILITOT 0.6 07/15/2017 0900   GFRNONAA 71 07/15/2017 0900   GFRNONAA 79 12/27/2015 0859   GFRAA 82 07/15/2017 0900     GFRAA >89 12/27/2015 0859   Lab Results  Component Value Date   CHOL 210 (H) 07/15/2017   HDL 49 07/15/2017   LDLCALC 146 (H) 07/15/2017   TRIG 76 07/15/2017   ETT: 02/2014 ETT Interpretation:  normal - no evidence of ischemia by ST analysis Comments: Patient presents today for routine GXT. Has strong FH for CAD - he is obese. Has had some dyspnea and palpitations.   Today the patient exercised on the standard Bruce protocol for a total of 9 minutes.  Good exercise tolerance.  Adequate blood pressure response.  Clinically negative for chest pain. Test was stopped due to achievement of target HR.  EKG negative for ischemia. No significant arrhythmia noted.   48-Holter monitor  Rare PVCs, infrequent PACs.  No arrhythmias or significant pauses identified. Normal Holter monitor, no therapy necessary.  Accessory Clinical Findings  Echocardiogram - 2009,Overall left ventricular systolic function was normal. Left ventricular ejection fraction was  estimated to be 60 %. There were no left ventricular regional wall motion abnormalities. - Estimated peak pulmonary artery systolic pressure 24 mmHg.  TTE: 01/25/2016 - Left ventricle: The cavity size was normal. Wall thickness was  normal. Systolic function was normal. The estimated ejection  fraction was in the range of 60% to 65%. Wall motion was normal;  there were no regional wall motion abnormalities. Left  ventricular diastolic function parameters were normal. - Mitral valve: Mildly thickened leaflets . There was trivial  regurgitation. - Left atrium: The atrium was normal in size. - Right atrium: The atrium was mildly dilated. - Inferior vena cava: The vessel was normal in size. The  respirophasic diameter changes were in the normal range (>= 50%),  consistent with normal central venous pressure.  Impressions: - LVEF 60-65%, normal wall thickness, normal wall motion, normal  diastolic function, normal LA size,  mildly dilated RA, no  significant TR, normal IVC.  ECG - sinus rhythm, normal EKG.    Assessment & Plan  1.  Chest pain -the pain is very typical only at rest sharp lasting few seconds, he is lifting heavy weights about 220 pounds and does not feel any pain or shortness of breath then.  I will hold off on ischemic workup.  2 you inspiratory. Frequent presyncopal episodes a curetting several times a week - 48 hour Holter monitor during which he had an episode of presyncope showed only a few pieces PVCs otherwise no arrhythmias or pauses. His symptoms have resolved with exercise and dehydration. His echocardiogram showed normal systolic and diastolic function.  These have resolved.  3. Palpitations - improved, Holter monitor only showed a few PACs and PVCs.  Only few seconds lasting with no associated symptoms.  3. Hyperlipidemia - LDL 146, LDL improved to 112 with diet and exercise a year ago, he is reluctant to take medication, we will again try a trial of healthy diet and exercise repeat in 2 months, and if his LDL is still elevated he is open to start statin.  Follow-up in 3 months.  Christopher Dawley, MD, [facility] 07/18/2017, 10:12 AM

## 2017-07-18 NOTE — Addendum Note (Signed)
Addended by: Nuala Alpha on: 07/18/2017 10:44 AM   Modules accepted: Orders

## 2017-07-26 ENCOUNTER — Other Ambulatory Visit: Payer: Self-pay

## 2017-07-26 ENCOUNTER — Encounter: Payer: Self-pay | Admitting: Emergency Medicine

## 2017-07-26 ENCOUNTER — Ambulatory Visit: Payer: 59 | Admitting: Emergency Medicine

## 2017-07-26 VITALS — BP 128/60 | HR 75 | Temp 98.9°F | Resp 16 | Ht 70.0 in | Wt 265.2 lb

## 2017-07-26 DIAGNOSIS — M791 Myalgia, unspecified site: Secondary | ICD-10-CM

## 2017-07-26 DIAGNOSIS — R531 Weakness: Secondary | ICD-10-CM | POA: Diagnosis not present

## 2017-07-26 DIAGNOSIS — B349 Viral infection, unspecified: Secondary | ICD-10-CM | POA: Diagnosis not present

## 2017-07-26 NOTE — Patient Instructions (Addendum)
IF you received an x-ray today, you will receive an invoice from [facility]. Please contact [facility] at [phone] with questions or concerns regarding your invoice.   IF you received labwork today, you will receive an invoice from [facility]. Please contact [facility] at [phone] with questions or concerns regarding your invoice.   Our billing staff will not be able to assist you with questions regarding bills from these companies.  You will be contacted with the lab results as soon as they are available. The fastest way to get your results is to activate your My Chart account. Instructions are located on the last page of this paperwork. If you have not heard from [place] regarding the results in 2 weeks, please contact this office.     Viral Illness, Adult Viruses are tiny germs that can get into a person's body and cause illness. There are many different types of viruses, and they cause many types of illness. Viral illnesses can range from mild to severe. They can affect various parts of the body. Common illnesses that are caused by a virus include colds and the flu. Viral illnesses also include serious conditions such as HIV/AIDS (human immunodeficiency virus/acquired immunodeficiency syndrome). A few viruses have been linked to certain cancers. What are the causes? Many types of viruses can cause illness. Viruses invade cells in your body, multiply, and cause the infected cells to malfunction or die. When the cell dies, it releases more of the virus. When this happens, you develop symptoms of the illness, and the virus continues to spread to other cells. If the virus takes over the function of the cell, it can cause the cell to divide and grow out of control, as is the case when a virus causes cancer. Different viruses get into the body in different ways. You can get a virus by:  Swallowing food or water that is contaminated with the virus.  Breathing in droplets  that have been coughed or sneezed into the air by an infected person.  Touching a surface that has been contaminated with the virus and then touching your eyes, nose, or mouth.  Being bitten by an insect or animal that carries the virus.  Having sexual contact with a person who is infected with the virus.  Being exposed to blood or fluids that contain the virus, either through an open cut or during a transfusion.  If a virus enters your body, your body's defense system (immune system) will try to fight the virus. You may be at higher risk for a viral illness if your immune system is weak. What are the signs or symptoms? Symptoms vary depending on the type of virus and the location of the cells that it invades. Common symptoms of the main types of viral illnesses include: Cold and flu viruses  Fever.  Headache.  Sore throat.  Muscle aches.  Nasal congestion.  Cough. Digestive system (gastrointestinal) viruses  Fever.  Abdominal pain.  Nausea.  Diarrhea. Liver viruses (hepatitis)  Loss of appetite.  Tiredness.  Yellowing of the skin (jaundice). Brain and spinal cord viruses  Fever.  Headache.  Stiff neck.  Nausea and vomiting.  Confusion or sleepiness. Skin viruses  Warts.  Itching.  Rash. Sexually transmitted viruses  Discharge.  Swelling.  Redness.  Rash. How is this treated? Viruses can be difficult to treat because they live within cells. Antibiotic medicines do not treat viruses because these drugs do not get inside cells. Treatment for a viral illness  may include:  Resting and drinking plenty of fluids.  Medicines to relieve symptoms. These can include over-the-counter medicine for pain and fever, medicines for cough or congestion, and medicines to relieve diarrhea.  Antiviral medicines. These drugs are available only for certain types of viruses. They may help reduce flu symptoms if taken early. There are also many antiviral medicines  for hepatitis and HIV/AIDS.  Some viral illnesses can be prevented with vaccinations. A common example is the flu shot. Follow these instructions at home: Medicines   Take over-the-counter and prescription medicines only as told by your health care provider.  If you were prescribed an antiviral medicine, take it as told by your health care provider. Do not stop taking the medicine even if you start to feel better.  Be aware of when antibiotics are needed and when they are not needed. Antibiotics do not treat viruses. If your health care provider thinks that you may have a bacterial infection as well as a viral infection, you may get an antibiotic. ? Do not ask for an antibiotic prescription if you have been diagnosed with a viral illness. That will not make your illness go away faster. ? Frequently taking antibiotics when they are not needed can lead to antibiotic resistance. When this develops, the medicine no longer works against the bacteria that it normally fights. General instructions  Drink enough fluids to keep your urine clear or pale yellow.  Rest as much as possible.  Return to your normal activities as told by your health care provider. Ask your health care provider what activities are safe for you.  Keep all follow-up visits as told by your health care provider. This is important. How is this prevented? Take these actions to reduce your risk of viral infection:  Eat a healthy diet and get enough rest.  Wash your hands often with soap and water. This is especially important when you are in public places. If soap and water are not available, use hand sanitizer.  Avoid close contact with friends and family who have a viral illness.  If you travel to areas where viral gastrointestinal infection is common, avoid drinking water or eating raw food.  Keep your immunizations up to date. Get a flu shot every year as told by your health care provider.  Do not share toothbrushes,  nail clippers, razors, or needles with other people.  Always practice safe sex.  Contact a health care provider if:  You have symptoms of a viral illness that do not go away.  Your symptoms come back after going away.  Your symptoms get worse. Get help right away if:  You have trouble breathing.  You have a severe headache or a stiff neck.  You have severe vomiting or abdominal pain. This information is not intended to replace advice given to you by your health care provider. Make sure you discuss any questions you have with your health care provider. Document Released: 01/13/2016 Document Revised: 02/15/2016 Document Reviewed: 01/13/2016 Elsevier Interactive Patient Education  [employer].

## 2017-07-26 NOTE — Progress Notes (Signed)
Christopher [name] Colon 56 y.o.   Chief Complaint  Patient presents with  . Generalized Body Aches    per patient comes and goes with slight sorethroat this morning    HISTORY OF PRESENT ILLNESS: This is a 56 y.o. male complaining of general weakness x 2 days. Feels like he is having the flu with sore throat. HPI   Prior to Admission medications   Not on File    Allergies  Allergen Reactions  . Asa Arthritis Strength-Antacid [Aspirin Buffered] Swelling  . Penicillins Hives  . Sulfa Antibiotics Hives    Patient Active Problem List   Diagnosis Date Noted  . Influenza with respiratory manifestation other than pneumonia 11/08/2016  . Family history of premature CAD 01/13/2016  . Hyperlipidemia 01/13/2016  . Low testosterone in male 05/07/2014  . PVC (premature ventricular contraction) 01/14/2014  . Hearing impaired 12/17/2013  . GERD (gastroesophageal reflux disease) 10/31/2011  . Hypogonadism male 10/31/2011    Past Medical History:  Diagnosis Date  . GERD (gastroesophageal reflux disease) 10/31/2011  . Hearing impaired 12/17/2013   lifelong   . 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 TONSILLECTOMY      Social History   Socioeconomic History  . Marital status: Married    Spouse name: Not on file  . Number of children: Not on file  . Years of education: Not on file  . Highest education level: Not on file  Social Needs  . Financial resource strain: Not on file  . Food insecurity - worry: Not on file  . Food insecurity - inability: Not on file  . Transportation needs - medical: Not on file  . Transportation needs - non-medical: Not on file  Occupational History  . Occupation: Chemical engineer: [employer]  Tobacco Use  . Smoking status: Never Smoker  . Smokeless tobacco: Never Used  Substance and Sexual Activity  . Alcohol use: No  . Drug use: No  . Sexual activity: Yes  Other Topics  Concern  . Not on file  Social History Narrative   Married.  Education: Grade School.    Family History  Problem Relation Age of Onset  . Cancer Father   . Hyperlipidemia Father   . Heart disease Mother   . Hyperlipidemia Mother      Review of Systems  Constitutional: Positive for malaise/fatigue.  HENT: Positive for sore throat.   Eyes: Negative.   Respiratory: Negative.  Negative for cough and shortness of breath.   Cardiovascular: Negative.  Negative for chest pain and palpitations.  Gastrointestinal: Negative.  Negative for abdominal pain, diarrhea, nausea and vomiting.  Genitourinary: Negative.  Negative for dysuria and hematuria.  Skin: Negative for rash.  Neurological: Positive for weakness. Negative for dizziness and headaches.  Endo/Heme/Allergies: Negative.   All other systems reviewed and are negative.  Vitals:   07/26/17 1636  BP: 128/60  Pulse: 75  Resp: 16  Temp: 98.9 F (37.2 C)  SpO2: 98%     Physical Exam  Constitutional: He is oriented to person, place, and time. He appears well-developed and well-nourished.  HENT:  Head: Normocephalic and atraumatic.  Right Ear: External ear normal.  Left Ear: External ear normal.  Nose: Nose normal.  Mouth/Throat: Oropharynx is clear and moist.  Eyes: Conjunctivae are normal. Pupils are equal, round, and reactive to light.  Neck: Normal range of motion. No JVD present. No thyromegaly present.  Cardiovascular: Normal rate,  regular rhythm, normal heart sounds and intact distal pulses.  Pulmonary/Chest: Effort normal and breath sounds normal.  Abdominal: Soft. Bowel sounds are normal. There is no tenderness.  Musculoskeletal: Normal range of motion. He exhibits no edema or tenderness.  Lymphadenopathy:    He has no cervical adenopathy.  Neurological: He is alert and oriented to person, place, and time. No sensory deficit. He exhibits normal muscle tone.  Skin: Skin is warm and dry. Capillary refill takes less  than 2 seconds. No rash noted.  Psychiatric: He has a normal mood and affect.  Vitals reviewed.    ASSESSMENT & PLAN: Camar was seen today for generalized body aches.  Diagnoses and all orders for this visit:  General weakness -     CBC with Differential/Platelet -     Comprehensive metabolic panel -     CK -     Thyroid Profile -     PSA  Generalized muscle ache -     CBC with Differential/Platelet -     Comprehensive metabolic panel -     CK -     Thyroid Profile -     PSA  Viral illness -     Thyroid Profile -     PSA    Patient Instructions       IF you received an x-ray today, you will receive an invoice from [facility]. Please contact [facility] at [phone] with questions or concerns regarding your invoice.   IF you received labwork today, you will receive an invoice from [facility]. Please contact [facility] at [phone] with questions or concerns regarding your invoice.   Our billing staff will not be able to assist you with questions regarding bills from these companies.  You will be contacted with the lab results as soon as they are available. The fastest way to get your results is to activate your My Chart account. Instructions are located on the last page of this paperwork. If you have not heard from [place] regarding the results in 2 weeks, please contact this office.     Viral Illness, Adult Viruses are tiny germs that can get into a person's body and cause illness. There are many different types of viruses, and they cause many types of illness. Viral illnesses can range from mild to severe. They can affect various parts of the body. Common illnesses that are caused by a virus include colds and the flu. Viral illnesses also include serious conditions such as HIV/AIDS (human immunodeficiency virus/acquired immunodeficiency syndrome). A few viruses have been linked to certain cancers. What are the causes? Many types of viruses can  cause illness. Viruses invade cells in your body, multiply, and cause the infected cells to malfunction or die. When the cell dies, it releases more of the virus. When this happens, you develop symptoms of the illness, and the virus continues to spread to other cells. If the virus takes over the function of the cell, it can cause the cell to divide and grow out of control, as is the case when a virus causes cancer. Different viruses get into the body in different ways. You can get a virus by:  Swallowing food or water that is contaminated with the virus.  Breathing in droplets that have been coughed or sneezed into the air by an infected person.  Touching a surface that has been contaminated with the virus and then touching your eyes, nose, or mouth.  Being bitten by an insect or animal that carries  the virus.  Having sexual contact with a person who is infected with the virus.  Being exposed to blood or fluids that contain the virus, either through an open cut or during a transfusion.  If a virus enters your body, your body's defense system (immune system) will try to fight the virus. You may be at higher risk for a viral illness if your immune system is weak. What are the signs or symptoms? Symptoms vary depending on the type of virus and the location of the cells that it invades. Common symptoms of the main types of viral illnesses include: Cold and flu viruses  Fever.  Headache.  Sore throat.  Muscle aches.  Nasal congestion.  Cough. Digestive system (gastrointestinal) viruses  Fever.  Abdominal pain.  Nausea.  Diarrhea. Liver viruses (hepatitis)  Loss of appetite.  Tiredness.  Yellowing of the skin (jaundice). Brain and spinal cord viruses  Fever.  Headache.  Stiff neck.  Nausea and vomiting.  Confusion or sleepiness. Skin viruses  Warts.  Itching.  Rash. Sexually transmitted viruses  Discharge.  Swelling.  Redness.  Rash. How is this  treated? Viruses can be difficult to treat because they live within cells. Antibiotic medicines do not treat viruses because these drugs do not get inside cells. Treatment for a viral illness may include:  Resting and drinking plenty of fluids.  Medicines to relieve symptoms. These can include over-the-counter medicine for pain and fever, medicines for cough or congestion, and medicines to relieve diarrhea.  Antiviral medicines. These drugs are available only for certain types of viruses. They may help reduce flu symptoms if taken early. There are also many antiviral medicines for hepatitis and HIV/AIDS.  Some viral illnesses can be prevented with vaccinations. A common example is the flu shot. Follow these instructions at home: Medicines   Take over-the-counter and prescription medicines only as told by your health care provider.  If you were prescribed an antiviral medicine, take it as told by your health care provider. Do not stop taking the medicine even if you start to feel better.  Be aware of when antibiotics are needed and when they are not needed. Antibiotics do not treat viruses. If your health care provider thinks that you may have a bacterial infection as well as a viral infection, you may get an antibiotic. ? Do not ask for an antibiotic prescription if you have been diagnosed with a viral illness. That will not make your illness go away faster. ? Frequently taking antibiotics when they are not needed can lead to antibiotic resistance. When this develops, the medicine no longer works against the bacteria that it normally fights. General instructions  Drink enough fluids to keep your urine clear or pale yellow.  Rest as much as possible.  Return to your normal activities as told by your health care provider. Ask your health care provider what activities are safe for you.  Keep all follow-up visits as told by your health care provider. This is important. How is this  prevented? Take these actions to reduce your risk of viral infection:  Eat a healthy diet and get enough rest.  Wash your hands often with soap and water. This is especially important when you are in public places. If soap and water are not available, use hand sanitizer.  Avoid close contact with friends and family who have a viral illness.  If you travel to areas where viral gastrointestinal infection is common, avoid drinking water or eating raw food.  Keep  your immunizations up to date. Get a flu shot every year as told by your health care provider.  Do not share toothbrushes, nail clippers, razors, or needles with other people.  Always practice safe sex.  Contact a health care provider if:  You have symptoms of a viral illness that do not go away.  Your symptoms come back after going away.  Your symptoms get worse. Get help right away if:  You have trouble breathing.  You have a severe headache or a stiff neck.  You have severe vomiting or abdominal pain. This information is not intended to replace advice given to you by your health care provider. Make sure you discuss any questions you have with your health care provider. Document Released: 01/13/2016 Document Revised: 02/15/2016 Document Reviewed: 01/13/2016 Elsevier Interactive Patient Education  2018 Elsevier Inc.      Agustina Caroli, MD [facility]

## 2017-07-27 LAB — PSA: PROSTATE SPECIFIC AG, SERUM: 1.4 ng/mL (ref 0.0–4.0)

## 2017-07-27 LAB — COMPREHENSIVE METABOLIC PANEL
ALBUMIN: 4.1 g/dL (ref 3.5–5.5)
ALK PHOS: 83 IU/L (ref 39–117)
ALT: 26 IU/L (ref 0–44)
AST: 26 IU/L (ref 0–40)
Albumin/Globulin Ratio: 1.2 (ref 1.2–2.2)
BILIRUBIN TOTAL: 0.5 mg/dL (ref 0.0–1.2)
BUN/Creatinine Ratio: 19 (ref 9–20)
BUN: 20 mg/dL (ref 6–24)
CHLORIDE: 102 mmol/L (ref 96–106)
CO2: 22 mmol/L (ref 20–29)
CREATININE: 1.07 mg/dL (ref 0.76–1.27)
Calcium: 9.1 mg/dL (ref 8.7–10.2)
GFR calc non Af Amer: 77 mL/min/{1.73_m2} (ref >59)
GFR, EST AFRICAN AMERICAN: 89 mL/min/{1.73_m2} (ref >59)
Globulin, Total: 3.3 g/dL (ref 1.5–4.5)
Glucose: 114 mg/dL — ABNORMAL HIGH (ref 65–99)
Potassium: 4.1 mmol/L (ref 3.5–5.2)
SODIUM: 139 mmol/L (ref 134–144)
TOTAL PROTEIN: 7.4 g/dL (ref 6.0–8.5)

## 2017-07-27 LAB — CBC WITH DIFFERENTIAL/PLATELET
BASOS: 0 %
Basophils Absolute: 0 10*3/uL (ref 0.0–0.2)
EOS (ABSOLUTE): 0.1 10*3/uL (ref 0.0–0.4)
EOS: 2 %
HEMATOCRIT: 41.8 % (ref 37.5–51.0)
HEMOGLOBIN: 14.3 g/dL (ref 13.0–17.7)
Immature Grans (Abs): 0 10*3/uL (ref 0.0–0.1)
Immature Granulocytes: 0 %
LYMPHS ABS: 1.3 10*3/uL (ref 0.7–3.1)
Lymphs: 22 %
MCH: 28.6 pg (ref 26.6–33.0)
MCHC: 34.2 g/dL (ref 31.5–35.7)
MCV: 84 fL (ref 79–97)
MONOCYTES: 15 %
Monocytes Absolute: 0.9 10*3/uL (ref 0.1–0.9)
NEUTROS ABS: 3.5 10*3/uL (ref 1.4–7.0)
Neutrophils: 61 %
Platelets: 228 10*3/uL (ref 150–379)
RBC: 5 x10E6/uL (ref 4.14–5.80)
RDW: 13.9 % (ref 12.3–15.4)
WBC: 5.9 10*3/uL (ref 3.4–10.8)

## 2017-07-27 LAB — THYROID PANEL
Free Thyroxine Index: 1.8 (ref 1.2–4.9)
T3 Uptake Ratio: 29 % (ref 24–39)
T4, Total: 6.2 ug/dL (ref 4.5–12.0)

## 2017-07-27 LAB — CK: CK TOTAL: 172 U/L (ref 24–204)

## 2017-07-29 ENCOUNTER — Ambulatory Visit: Payer: 59 | Admitting: Emergency Medicine

## 2017-07-29 ENCOUNTER — Other Ambulatory Visit: Payer: Self-pay

## 2017-07-29 ENCOUNTER — Encounter: Payer: Self-pay | Admitting: Emergency Medicine

## 2017-07-29 VITALS — BP 120/66 | HR 73 | Temp 98.5°F | Resp 16 | Ht 70.0 in | Wt 261.4 lb

## 2017-07-29 DIAGNOSIS — R059 Cough, unspecified: Secondary | ICD-10-CM

## 2017-07-29 DIAGNOSIS — R05 Cough: Secondary | ICD-10-CM | POA: Diagnosis not present

## 2017-07-29 DIAGNOSIS — Z87898 Personal history of other specified conditions: Secondary | ICD-10-CM

## 2017-07-29 DIAGNOSIS — J029 Acute pharyngitis, unspecified: Secondary | ICD-10-CM | POA: Insufficient documentation

## 2017-07-29 DIAGNOSIS — J040 Acute laryngitis: Secondary | ICD-10-CM

## 2017-07-29 MED ORDER — AZITHROMYCIN 250 MG PO TABS: ORAL_TABLET | ORAL | 0 refills | Status: DC

## 2017-07-29 NOTE — Patient Instructions (Addendum)
     IF you received an x-ray today, you will receive an invoice from [facility]. Please contact [facility] at [phone] with questions or concerns regarding your invoice.   IF you received labwork today, you will receive an invoice from [facility]. Please contact [facility] at [phone] with questions or concerns regarding your invoice.   Our billing staff will not be able to assist you with questions regarding bills from these companies.  You will be contacted with the lab results as soon as they are available. The fastest way to get your results is to activate your My Chart account. Instructions are located on the last page of this paperwork. If you have not heard from us regarding the results in 2 weeks, please contact this office.     Pharyngitis Pharyngitis is a sore throat (pharynx). There is redness, pain, and swelling of your throat. Follow these instructions at home:  Drink enough fluids to keep your pee (urine) clear or pale yellow.  Only take medicine as told by your doctor.  You may get sick again if you do not take medicine as told. Finish your medicines, even if you start to feel better.  Do not take aspirin.  Rest.  Rinse your mouth (gargle) with salt water ( tsp of salt per 1 qt of water) every 1-2 hours. This will help the pain.  If you are not at risk for choking, you can suck on hard candy or sore throat lozenges. Contact a doctor if:  You have large, tender lumps on your neck.  You have a rash.  You cough up green, yellow-brown, or bloody spit. Get help right away if:  You have a stiff neck.  You drool or cannot swallow liquids.  You throw up (vomit) or are not able to keep medicine or liquids down.  You have very bad pain that does not go away with medicine.  You have problems breathing (not from a stuffy nose). This information is not intended to replace advice given to you by your health care provider. Make sure you  discuss any questions you have with your health care provider. Document Released: 02/20/2008 Document Revised: 02/09/2016 Document Reviewed: 05/11/2013 Elsevier Interactive Patient Education  2017 Else[employer] Inc.  

## 2017-07-29 NOTE — Progress Notes (Addendum)
Christopher [name] Colon 56 y.o.   Chief Complaint  Patient presents with  . Cough    nonproductive  - per patient new SXs for 2 days  . Sore Throat    HISTORY OF PRESENT ILLNESS: This is a 56 y.o. male complaining of sore throat, cough, loss of voice and fever x 2 days but has been sick x 5 days; seen by me last week with viral illness and general aches. Lab results reviewed with patient. All WNL.  Cough  Associated symptoms include a fever and a sore throat. Pertinent negatives include no chest pain, chills, eye redness, myalgias, rash or shortness of breath.  Sore Throat   Associated symptoms include congestion. Pertinent negatives include no abdominal pain, coughing, diarrhea, neck pain, shortness of breath or vomiting.     Prior to Admission medications   Not on File    Allergies  Allergen Reactions  . Asa Arthritis Strength-Antacid [Aspirin Buffered] Swelling  . Penicillins Hives  . Sulfa Antibiotics Hives    Patient Active Problem List   Diagnosis Date Noted  . General weakness 07/26/2017  . Generalized muscle ache 07/26/2017  . Influenza with respiratory manifestation other than pneumonia 11/08/2016  . Family history of premature CAD 01/13/2016  . Hyperlipidemia 01/13/2016  . Low testosterone in male 05/07/2014  . PVC (premature ventricular contraction) 01/14/2014  . Hearing impaired 12/17/2013  . GERD (gastroesophageal reflux disease) 10/31/2011  . Hypogonadism male 10/31/2011    Past Medical History:  Diagnosis Date  . GERD (gastroesophageal reflux disease) 10/31/2011  . Hearing impaired 12/17/2013   lifelong   . 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 TONSILLECTOMY      Social History   Socioeconomic History  . Marital status: Married    Spouse name: Not on file  . Number of children: Not on file  . Years of education: Not on file  . Highest education level: Not on file  Social Needs  . Financial  resource strain: Not on file  . Food insecurity - worry: Not on file  . Food insecurity - inability: Not on file  . Transportation needs - medical: Not on file  . Transportation needs - non-medical: Not on file  Occupational History  . Occupation: Chemical engineer: [employer]  Tobacco Use  . Smoking status: Never Smoker  . Smokeless tobacco: Never Used  Substance and Sexual Activity  . Alcohol use: No  . Drug use: No  . Sexual activity: Yes  Other Topics Concern  . Not on file  Social History Narrative   Married.  Education: Grade School.    Family History  Problem Relation Age of Onset  . Cancer Father   . Hyperlipidemia Father   . Heart disease Mother   . Hyperlipidemia Mother      Review of Systems  Constitutional: Positive for fever. Negative for chills and malaise/fatigue.  HENT: Positive for congestion and sore throat.   Eyes: Negative for discharge and redness.  Respiratory: Negative for cough and shortness of breath.   Cardiovascular: Negative for chest pain, palpitations and leg swelling.  Gastrointestinal: Negative for abdominal pain, diarrhea, nausea and vomiting.  Genitourinary: Negative.   Musculoskeletal: Negative.  Negative for myalgias and neck pain.  Skin: Negative.  Negative for rash.  Neurological: Negative.   Endo/Heme/Allergies: Negative.   All other systems reviewed and are negative.   Vitals:   07/29/17 0847  BP: 120/66  Pulse: 73  Resp: 16  Temp: 98.5 F (36.9 C)  SpO2: 97%    Physical Exam  Constitutional: He is oriented to person, place, and time. He appears well-developed and well-nourished.  HENT:  Head: Normocephalic and atraumatic.  Mouth/Throat: Uvula is midline. Posterior oropharyngeal erythema present. No oropharyngeal exudate, posterior oropharyngeal edema or tonsillar abscesses.  Eyes: Conjunctivae and EOM are normal. Pupils are equal, round, and reactive to light.  Neck: Normal range of motion. Neck  supple. No JVD present. No thyromegaly present.  Cardiovascular: Normal rate, regular rhythm, normal heart sounds and intact distal pulses.  Pulmonary/Chest: Effort normal and breath sounds normal.  Abdominal: Soft. Bowel sounds are normal. He exhibits no distension. There is no tenderness.  Musculoskeletal: Normal range of motion.  Lymphadenopathy:    He has no cervical adenopathy.  Neurological: He is alert and oriented to person, place, and time. No sensory deficit. He exhibits normal muscle tone.  Vitals reviewed.    ASSESSMENT & PLAN: Christopher [name] was seen today for cough and sore throat.  Diagnoses and all orders for this visit:  Acute pharyngitis, unspecified etiology -     azithromycin (ZITHROMAX) 250 MG tablet; Sig as indicated  Sore throat  Acute laryngitis  History of fever    Patient Instructions       IF you received an x-ray today, you will receive an invoice from [facility]. Please contact [facility] at [phone] with questions or concerns regarding your invoice.   IF you received labwork today, you will receive an invoice from [facility]. Please contact [facility] at [phone] with questions or concerns regarding your invoice.   Our billing staff will not be able to assist you with questions regarding bills from these companies.  You will be contacted with the lab results as soon as they are available. The fastest way to get your results is to activate your My Chart account. Instructions are located on the last page of this paperwork. If you have not heard from [place] regarding the results in 2 weeks, please contact this office.     Pharyngitis Pharyngitis is a sore throat (pharynx). There is redness, pain, and swelling of your throat. Follow these instructions at home:  Drink enough fluids to keep your pee (urine) clear or pale yellow.  Only take medicine as told by your doctor. ? You may get sick again if you do not take medicine as  told. Finish your medicines, even if you start to feel better. ? Do not take aspirin.  Rest.  Rinse your mouth (gargle) with salt water ( tsp of salt per 1 qt of water) every 1-2 hours. This will help the pain.  If you are not at risk for choking, you can suck on hard candy or sore throat lozenges. Contact a doctor if:  You have large, tender lumps on your neck.  You have a rash.  You cough up green, yellow-brown, or bloody spit. Get help right away if:  You have a stiff neck.  You drool or cannot swallow liquids.  You throw up (vomit) or are not able to keep medicine or liquids down.  You have very bad pain that does not go away with medicine.  You have problems breathing (not from a stuffy nose). This information is not intended to replace advice given to you by your health care provider. Make sure you discuss any questions you have with your health care provider. Document Released: 02/20/2008 Document Revised: 02/09/2016 Document Reviewed: 05/11/2013 Elsevier Interactive  Patient Education  2017 [employer].      Agustina Caroli, MD [facility]

## 2017-08-02 ENCOUNTER — Telehealth: Payer: Self-pay | Admitting: Emergency Medicine

## 2017-08-02 NOTE — Telephone Encounter (Signed)
Copied from [place] [id number]. Topic: Inquiry >> Aug 02, 2017  4:51 PM Christopher [name] I, NT wrote: Reason for CRM: Pt call wife Lida,torres said her husband still have Sore Throat and finish all his med and do not feel Better can yo call for Different med,Thank

## 2017-09-20 ENCOUNTER — Other Ambulatory Visit: Payer: Self-pay

## 2017-09-20 ENCOUNTER — Encounter: Payer: Self-pay | Admitting: Emergency Medicine

## 2017-09-20 ENCOUNTER — Ambulatory Visit: Payer: 59 | Admitting: Emergency Medicine

## 2017-09-20 ENCOUNTER — Ambulatory Visit: Payer: 59 | Admitting: Physician Assistant

## 2017-09-20 VITALS — BP 138/78 | HR 81 | Temp 98.5°F | Resp 16 | Wt 267.2 lb

## 2017-09-20 DIAGNOSIS — J310 Chronic rhinitis: Secondary | ICD-10-CM | POA: Diagnosis not present

## 2017-09-20 DIAGNOSIS — R0981 Nasal congestion: Secondary | ICD-10-CM

## 2017-09-20 DIAGNOSIS — Z23 Encounter for immunization: Secondary | ICD-10-CM | POA: Diagnosis not present

## 2017-09-20 MED ORDER — PSEUDOEPHEDRINE-GUAIFENESIN ER 60-600 MG PO TB12: 1.0000 | ORAL_TABLET | Freq: Two times a day (BID) | ORAL | 1 refills | Status: AC

## 2017-09-20 MED ORDER — TRIAMCINOLONE ACETONIDE 55 MCG/ACT NA AERO: 2.0000 | INHALATION_SPRAY | Freq: Every day | NASAL | 12 refills | Status: DC

## 2017-09-20 NOTE — Patient Instructions (Addendum)
IF you received an x-ray today, you will receive an invoice from [facility]. Please contact [facility] at [phone] with questions or concerns regarding your invoice.   IF you received labwork today, you will receive an invoice from [place]. Please contact [facility] at [phone] with questions or concerns regarding your invoice.   Our billing staff will not be able to assist you with questions regarding bills from these companies.  You will be contacted with the lab results as soon as they are available. The fastest way to get your results is to activate your My Chart account. Instructions are located on the last page of this paperwork. If you have not heard from [place] regarding the results in 2 weeks, please contact this office.     Nonallergic Rhinitis Nonallergic rhinitis is a condition that causes symptoms that affect the nose, such as a runny nose and a stuffed-up nose (nasal congestion) that can make it hard to breathe through the nose. This condition is different from having an allergy (allergic rhinitis). Allergic rhinitis occurs when the body's defense system (immune system) reacts to a substance that you are allergic to (allergen), such as pollen, pet dander, mold, or dust. Nonallergic rhinitis has many similar symptoms, but it is not caused by allergens. Nonallergic rhinitis can be a short-term or long-term problem. What are the causes? This condition can be caused by many different things. Some common types of nonallergic rhinitis include: Infectious rhinitis  This is usually due to an infection in the upper respiratory tract. Vasomotor rhinitis  This is the most common type of long-term nonallergic rhinitis.  It is caused by too much blood flow through the nose, which makes the tissue inside of the nose swell.  Symptoms are often triggered by strong odors, cold air, stress, drinking alcohol, cigarette smoke, or changes in the weather. Occupational  rhinitis  This type is caused by triggers in the workplace, such as chemicals, dusts, animal dander, or air pollution. Hormonal rhinitis  This type occurs in women as a result of an increase in the male hormone estrogen.  It may occur during pregnancy, puberty, and menstrual cycles.  Symptoms improve when estrogen levels drop. Drug-induced rhinitis Several drugs can cause nonallergic rhinitis, including:  Medicines that are used to treat high blood pressure, heart disease, and Parkinson disease.  Aspirin and NSAIDs.  Over-the-counter nasal decongestant sprays. These can cause a type of nonallergic rhinitis (rhinitis medicamentosa) when they are used for more than a few days.  Nonallergic rhinitis with eosinophilia syndrome (NARES)  This type is caused by having too much of a certain type of white blood cell (eosinophil). Nonallergic rhinitis can also be caused by a reaction to eating hot or spicy foods. This does not usually cause long-term symptoms. In some cases, the cause of nonallergic rhinitis is not known. What increases the risk? You are more likely to develop this condition if:  You are 43-26 years of age.  You are a woman. Women are twice as likely to have this condition.  What are the signs or symptoms? Common symptoms of this condition include:  Nasal congestion.  Runny nose.  The feeling of mucus going down the back of the throat (postnasal drip).  Trouble sleeping at night and daytime sleepiness.  Less common symptoms include:  Sneezing.  Coughing.  Itchy nose.  Bloodshot eyes.  How is this diagnosed? This condition may be diagnosed based on:  Your symptoms and medical history.  A physical exam.  Allergy testing to rule out allergic rhinitis. You may have skin tests or blood tests.  In some cases, the health care provider may take a swab of nasal secretions to look for an increased number of eosinophils. This would be done to confirm a  diagnosis of NARES. How is this treated? Treatment for this condition depends on the cause. No single treatment works for everyone. Work with your health care provider to find the best treatment for you. Treatment may include:  Avoiding the things that trigger your symptoms.  Using medicines to relieve congestion, such as: ? Steroid nasal spray. There are many types. You may need to try a few to find out which one works best. ? Decongestant medicine. This may be an oral medicine or a nasal spray. These medicines are only used for a short time.  Using medicines to relieve a runny nose. These may include antihistamine medicines or anticholinergic nasal sprays.  Surgery to remove tissue from inside the nose may be needed in severe cases if the condition has not improved after 6-12 months of medical treatment. Follow these instructions at home:  Take or use over-the-counter and prescription medicines only as told by your health care provider. Do not stop using your medicine even if you start to feel better.  Use salt-water (saline) rinses or other solutions (nasal washes or irrigations) to wash or rinse out the inside of your nose as told by your health care provider.  Do not take NSAIDs or medicines that contain aspirin if they make your symptoms worse.  Do not drink alcohol if it makes your symptoms worse.  Do not use any tobacco products, such as cigarettes, chewing tobacco, and e-cigarettes. If you need help quitting, ask your health care provider.  Avoid secondhand smoke.  Get some exercise every day. Exercise may help reduce symptoms of nonallergic rhinitis for some people. Ask your health care provider how much exercise and what types of exercise are safe for you.  Sleep with the head of your bed raised (elevated). This may reduce nighttime nasal congestion.  Keep all follow-up visits as told by your health care provider. This is important. Contact a health care provider if:  You  have a fever.  Your symptoms are getting worse at home.  Your symptoms are not responding to medicine.  You develop new symptoms, especially a headache or nosebleed. This information is not intended to replace advice given to you by your health care provider. Make sure you discuss any questions you have with your health care provider. Document Released: 12/26/2015 Document Revised: 02/09/2016 Document Reviewed: 11/24/2015 Elsevier Interactive Patient Education  2018 [employer].

## 2017-09-20 NOTE — Progress Notes (Signed)
Christopher [name] 57 y.o.   Chief Complaint  Patient presents with  . Nasal Congestion    x 5-6 days with sneezing    HISTORY OF PRESENT ILLNESS: This is a 57 y.o. male complaining of nasal congestion with sneezing x 5 days. No other significant symptoms.  HPI   Prior to Admission medications   Medication Sig Start Date End Date Taking? Authorizing Provider  azithromycin (ZITHROMAX) 250 MG tablet Sig as indicated 07/29/17  Yes [place], Ines Bloomer, MD    Allergies  Allergen Reactions  . Asa Arthritis Strength-Antacid [Aspirin Buffered] Swelling  . Penicillins Hives  . Sulfa Antibiotics Hives    Patient Active Problem List   Diagnosis Date Noted  . Acute pharyngitis 07/29/2017  . Sore throat 07/29/2017  . Acute laryngitis 07/29/2017  . General weakness 07/26/2017  . Generalized muscle ache 07/26/2017  . Influenza with respiratory manifestation other than pneumonia 11/08/2016  . Family history of premature CAD 01/13/2016  . Hyperlipidemia 01/13/2016  . Low testosterone in male 05/07/2014  . PVC (premature ventricular contraction) 01/14/2014  . Hearing impaired 12/17/2013  . GERD (gastroesophageal reflux disease) 10/31/2011  . Hypogonadism male 10/31/2011    Past Medical History:  Diagnosis Date  . GERD (gastroesophageal reflux disease) 10/31/2011  . Hearing impaired 12/17/2013   lifelong   . 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 TONSILLECTOMY      Social History   Socioeconomic History  . Marital status: Married    Spouse name: Not on file  . Number of children: Not on file  . Years of education: Not on file  . Highest education level: Not on file  Social Needs  . Financial resource strain: Not on file  . Food insecurity - worry: Not on file  . Food insecurity - inability: Not on file  . Transportation needs - medical: Not on file  . Transportation needs - non-medical: Not on file  Occupational  History  . Occupation: Chemical engineer: [employer]  Tobacco Use  . Smoking status: Never Smoker  . Smokeless tobacco: Never Used  Substance and Sexual Activity  . Alcohol use: No  . Drug use: No  . Sexual activity: Yes  Other Topics Concern  . Not on file  Social History Narrative   Married.  Education: Grade School.    Family History  Problem Relation Age of Onset  . Cancer Father   . Hyperlipidemia Father   . Heart disease Mother   . Hyperlipidemia Mother      Review of Systems  Constitutional: Negative.  Negative for chills and fever.  HENT: Positive for congestion. Negative for ear discharge, ear pain, nosebleeds and sore throat.   Eyes: Negative for discharge and redness.  Respiratory: Negative for cough and shortness of breath.   Cardiovascular: Negative for chest pain and palpitations.  Gastrointestinal: Negative for abdominal pain, diarrhea, nausea and vomiting.  Musculoskeletal: Negative.  Negative for myalgias and neck pain.  Skin: Negative for rash.  Neurological: Negative.  Negative for dizziness and headaches.  Endo/Heme/Allergies: Negative.   All other systems reviewed and are negative.  Vitals:   09/20/17 1601  BP: 138/78  Pulse: 81  Resp: 16  Temp: 98.5 F (36.9 C)  SpO2: 98%     Physical Exam  Constitutional: He is oriented to person, place, and time. He appears well-developed and well-nourished.  HENT:  Head: Normocephalic and atraumatic.  Right Ear:  External ear normal.  Left Ear: External ear normal.  Nose: Nose normal.  Mouth/Throat: Oropharynx is clear and moist.  Eyes: Conjunctivae and EOM are normal. Pupils are equal, round, and reactive to light.  Neck: Normal range of motion. Neck supple. No JVD present. No thyromegaly present.  Cardiovascular: Normal rate, regular rhythm and normal heart sounds.  Pulmonary/Chest: Effort normal and breath sounds normal.  Musculoskeletal: Normal range of motion.    Lymphadenopathy:    He has no cervical adenopathy.  Neurological: He is alert and oriented to person, place, and time.  Skin: Skin is warm and dry. Capillary refill takes less than 2 seconds. No rash noted.  Psychiatric: He has a normal mood and affect. His behavior is normal.  Vitals reviewed.    ASSESSMENT & PLAN: [name] was seen today for nasal congestion.  Diagnoses and all orders for this visit:  Nonallergic rhinitis  Nasal congestion  Need for influenza vaccination -     Flu Vaccine QUAD 36+ mos IM  Other orders -     triamcinolone (NASACORT) 55 MCG/ACT AERO nasal inhaler; Place 2 sprays into the nose daily. -     pseudoephedrine-guaifenesin (MUCINEX D) 60-600 MG 12 hr tablet; Take 1 tablet by mouth every 12 (twelve) hours for 5 days.    Patient Instructions       IF you received an x-ray today, you will receive an invoice from [facility]. Please contact [facility] at [phone] with questions or concerns regarding your invoice.   IF you received labwork today, you will receive an invoice from [place] [facility]. Please contact [facility] at [phone] with questions or concerns regarding your invoice.   Our billing staff will not be able to assist you with questions regarding bills from these companies.  You will be contacted with the lab results as soon as they are available. The fastest way to get your results is to activate your My Chart account. Instructions are located on the last page of this paperwork. If you have not heard from [place] regarding the results in 2 weeks, please contact this office.     Nonallergic Rhinitis Nonallergic rhinitis is a condition that causes symptoms that affect the nose, such as a runny nose and a stuffed-up nose (nasal congestion) that can make it hard to breathe through the nose. This condition is different from having an allergy (allergic rhinitis). Allergic rhinitis occurs when the body's defense system (immune  system) reacts to a substance that you are allergic to (allergen), such as pollen, pet dander, mold, or dust. Nonallergic rhinitis has many similar symptoms, but it is not caused by allergens. Nonallergic rhinitis can be a short-term or long-term problem. What are the causes? This condition can be caused by many different things. Some common types of nonallergic rhinitis include: Infectious rhinitis  This is usually due to an infection in the upper respiratory tract. Vasomotor rhinitis  This is the most common type of long-term nonallergic rhinitis.  It is caused by too much blood flow through the nose, which makes the tissue inside of the nose swell.  Symptoms are often triggered by strong odors, cold air, stress, drinking alcohol, cigarette smoke, or changes in the weather. Occupational rhinitis  This type is caused by triggers in the workplace, such as chemicals, dusts, animal dander, or air pollution. Hormonal rhinitis  This type occurs in women as a result of an increase in the male hormone estrogen.  It may occur during pregnancy, puberty, and menstrual cycles.  Symptoms  improve when estrogen levels drop. Drug-induced rhinitis Several drugs can cause nonallergic rhinitis, including:  Medicines that are used to treat high blood pressure, heart disease, and Parkinson disease.  Aspirin and NSAIDs.  Over-the-counter nasal decongestant sprays. These can cause a type of nonallergic rhinitis (rhinitis medicamentosa) when they are used for more than a few days.  Nonallergic rhinitis with eosinophilia syndrome (NARES)  This type is caused by having too much of a certain type of white blood cell (eosinophil). Nonallergic rhinitis can also be caused by a reaction to eating hot or spicy foods. This does not usually cause long-term symptoms. In some cases, the cause of nonallergic rhinitis is not known. What increases the risk? You are more likely to develop this condition if:  You  are 35-74 years of age.  You are a woman. Women are twice as likely to have this condition.  What are the signs or symptoms? Common symptoms of this condition include:  Nasal congestion.  Runny nose.  The feeling of mucus going down the back of the throat (postnasal drip).  Trouble sleeping at night and daytime sleepiness.  Less common symptoms include:  Sneezing.  Coughing.  Itchy nose.  Bloodshot eyes.  How is this diagnosed? This condition may be diagnosed based on:  Your symptoms and medical history.  A physical exam.  Allergy testing to rule out allergic rhinitis. You may have skin tests or blood tests.  In some cases, the health care provider may take a swab of nasal secretions to look for an increased number of eosinophils. This would be done to confirm a diagnosis of NARES. How is this treated? Treatment for this condition depends on the cause. No single treatment works for everyone. Work with your health care provider to find the best treatment for you. Treatment may include:  Avoiding the things that trigger your symptoms.  Using medicines to relieve congestion, such as: ? Steroid nasal spray. There are many types. You may need to try a few to find out which one works best. ? Decongestant medicine. This may be an oral medicine or a nasal spray. These medicines are only used for a short time.  Using medicines to relieve a runny nose. These may include antihistamine medicines or anticholinergic nasal sprays.  Surgery to remove tissue from inside the nose may be needed in severe cases if the condition has not improved after 6-12 months of medical treatment. Follow these instructions at home:  Take or use over-the-counter and prescription medicines only as told by your health care provider. Do not stop using your medicine even if you start to feel better.  Use salt-water (saline) rinses or other solutions (nasal washes or irrigations) to wash or rinse out the  inside of your nose as told by your health care provider.  Do not take NSAIDs or medicines that contain aspirin if they make your symptoms worse.  Do not drink alcohol if it makes your symptoms worse.  Do not use any tobacco products, such as cigarettes, chewing tobacco, and e-cigarettes. If you need help quitting, ask your health care provider.  Avoid secondhand smoke.  Get some exercise every day. Exercise may help reduce symptoms of nonallergic rhinitis for some people. Ask your health care provider how much exercise and what types of exercise are safe for you.  Sleep with the head of your bed raised (elevated). This may reduce nighttime nasal congestion.  Keep all follow-up visits as told by your health care provider. This is important.  Contact a health care provider if:  You have a fever.  Your symptoms are getting worse at home.  Your symptoms are not responding to medicine.  You develop new symptoms, especially a headache or nosebleed. This information is not intended to replace advice given to you by your health care provider. Make sure you discuss any questions you have with your health care provider. Document Released: 12/26/2015 Document Revised: 02/09/2016 Document Reviewed: 11/24/2015 Elsevier Interactive Patient Education  2018 Elsevier Inc.      Agustina Caroli, MD [facility]

## 2017-10-29 ENCOUNTER — Other Ambulatory Visit: Payer: 59

## 2017-10-29 ENCOUNTER — Ambulatory Visit: Payer: 59 | Admitting: Cardiology

## 2017-11-01 ENCOUNTER — Ambulatory Visit: Payer: 59 | Admitting: Cardiology

## 2017-11-04 ENCOUNTER — Ambulatory Visit: Payer: 59 | Admitting: Cardiology

## 2017-12-02 DIAGNOSIS — D1800 Hemangioma unspecified site: Secondary | ICD-10-CM | POA: Diagnosis not present

## 2017-12-02 DIAGNOSIS — L821 Other seborrheic keratosis: Secondary | ICD-10-CM | POA: Diagnosis not present

## 2017-12-02 DIAGNOSIS — D229 Melanocytic nevi, unspecified: Secondary | ICD-10-CM | POA: Diagnosis not present

## 2017-12-04 DIAGNOSIS — R002 Palpitations: Secondary | ICD-10-CM | POA: Diagnosis not present

## 2018-01-14 ENCOUNTER — Encounter: Payer: Self-pay | Admitting: Cardiology

## 2018-01-31 ENCOUNTER — Other Ambulatory Visit: Payer: 59

## 2018-01-31 ENCOUNTER — Ambulatory Visit: Payer: 59 | Admitting: Cardiology

## 2018-02-04 ENCOUNTER — Encounter: Payer: 59 | Admitting: Emergency Medicine

## 2018-02-07 ENCOUNTER — Other Ambulatory Visit: Payer: Self-pay

## 2018-02-07 ENCOUNTER — Ambulatory Visit (INDEPENDENT_AMBULATORY_CARE_PROVIDER_SITE_OTHER): Payer: 59 | Admitting: Emergency Medicine

## 2018-02-07 ENCOUNTER — Encounter: Payer: Self-pay | Admitting: Emergency Medicine

## 2018-02-07 VITALS — BP 120/74 | HR 73 | Temp 98.3°F | Resp 20 | Ht 69.92 in | Wt 256.4 lb

## 2018-02-07 DIAGNOSIS — Z0000 Encounter for general adult medical examination without abnormal findings: Secondary | ICD-10-CM

## 2018-02-07 NOTE — Patient Instructions (Addendum)
   IF you received an x-ray today, you will receive an invoice from [facility]. Please contact [facility] at [phone] with questions or concerns regarding your invoice.   IF you received labwork today, you will receive an invoice from [facility]. Please contact [facility] at [phone] with questions or concerns regarding your invoice.   Our billing staff will not be able to assist you with questions regarding bills from these companies.  You will be contacted with the lab results as soon as they are available. The fastest way to get your results is to activate your My Chart account. Instructions are located on the last page of this paperwork. If you have not heard from us regarding the results in 2 weeks, please contact this office.      Health Maintenance, Male A healthy lifestyle and preventive care is important for your health and wellness. Ask your health care provider about what schedule of regular examinations is right for you. What should I know about weight and diet? Eat a Healthy Diet  Eat plenty of vegetables, fruits, whole grains, low-fat dairy products, and lean protein.  Do not eat a lot of foods high in solid fats, added sugars, or salt.  Maintain a Healthy Weight Regular exercise can help you achieve or maintain a healthy weight. You should:  Do at least 150 minutes of exercise each week. The exercise should increase your heart rate and make you sweat (moderate-intensity exercise).  Do strength-training exercises at least twice a week.  Watch Your Levels of Cholesterol and Blood Lipids  Have your blood tested for lipids and cholesterol every 5 years starting at 57 years of age. If you are at high risk for heart disease, you should start having your blood tested when you are 57 years old. You may need to have your cholesterol levels checked more often if: ? Your lipid or cholesterol levels are high. ? You are older than 57 years of age. ? You  are at high risk for heart disease.  What should I know about cancer screening? Many types of cancers can be detected early and may often be prevented. Lung Cancer  You should be screened every year for lung cancer if: ? You are a current smoker who has smoked for at least 30 years. ? You are a former smoker who has quit within the past 15 years.  Talk to your health care provider about your screening options, when you should start screening, and how often you should be screened.  Colorectal Cancer  Routine colorectal cancer screening usually begins at 57 years of age and should be repeated every 5-10 years until you are 57 years old. You may need to be screened more often if early forms of precancerous polyps or small growths are found. Your health care provider may recommend screening at an earlier age if you have risk factors for colon cancer.  Your health care provider may recommend using home test kits to check for hidden blood in the stool.  A small camera at the end of a tube can be used to examine your colon (sigmoidoscopy or colonoscopy). This checks for the earliest forms of colorectal cancer.  Prostate and Testicular Cancer  Depending on your age and overall health, your health care provider may do certain tests to screen for prostate and testicular cancer.  Talk to your health care provider about any symptoms or concerns you have about testicular or prostate cancer.  Skin Cancer  Check your skin   from head to toe regularly.  Tell your health care provider about any new moles or changes in moles, especially if: ? There is a change in a mole's size, shape, or color. ? You have a mole that is larger than a pencil eraser.  Always use sunscreen. Apply sunscreen liberally and repeat throughout the day.  Protect yourself by wearing long sleeves, pants, a wide-brimmed hat, and sunglasses when outside.  What should I know about heart disease, diabetes, and high blood  pressure?  If you are 18-39 years of age, have your blood pressure checked every 3-5 years. If you are 40 years of age or older, have your blood pressure checked every year. You should have your blood pressure measured twice-once when you are at a hospital or clinic, and once when you are not at a hospital or clinic. Record the average of the two measurements. To check your blood pressure when you are not at a hospital or clinic, you can use: ? An automated blood pressure machine at a pharmacy. ? A home blood pressure monitor.  Talk to your health care provider about your target blood pressure.  If you are between 45-79 years old, ask your health care provider if you should take aspirin to prevent heart disease.  Have regular diabetes screenings by checking your fasting blood sugar level. ? If you are at a normal weight and have a low risk for diabetes, have this test once every three years after the age of 45. ? If you are overweight and have a high risk for diabetes, consider being tested at a younger age or more often.  A one-time screening for abdominal aortic aneurysm (AAA) by ultrasound is recommended for men aged 65-75 years who are current or former smokers. What should I know about preventing infection? Hepatitis B If you have a higher risk for hepatitis B, you should be screened for this virus. Talk with your health care provider to find out if you are at risk for hepatitis B infection. Hepatitis C Blood testing is recommended for:  Everyone born from 1945 through 1965.  Anyone with known risk factors for hepatitis C.  Sexually Transmitted Diseases (STDs)  You should be screened each year for STDs including gonorrhea and chlamydia if: ? You are sexually active and are younger than 57 years of age. ? You are older than 57 years of age and your health care provider tells you that you are at risk for this type of infection. ? Your sexual activity has changed since you were last  screened and you are at an increased risk for chlamydia or gonorrhea. Ask your health care provider if you are at risk.  Talk with your health care provider about whether you are at high risk of being infected with HIV. Your health care provider may recommend a prescription medicine to help prevent HIV infection.  What else can I do?  Schedule regular health, dental, and eye exams.  Stay current with your vaccines (immunizations).  Do not use any tobacco products, such as cigarettes, chewing tobacco, and e-cigarettes. If you need help quitting, ask your health care provider.  Limit alcohol intake to no more than 2 drinks per day. One drink equals 12 ounces of beer, 5 ounces of wine, or 1 ounces of hard liquor.  Do not use street drugs.  Do not share needles.  Ask your health care provider for help if you need support or information about quitting drugs.  Tell your health care   provider if you often feel depressed.  Tell your health care provider if you have ever been abused or do not feel safe at home. This information is not intended to replace advice given to you by your health care provider. Make sure you discuss any questions you have with your health care provider. Document Released: 03/01/2008 Document Revised: 05/02/2016 Document Reviewed: 06/07/2015 Elsevier Interactive Patient Education  2018 [employer] Inc.  

## 2018-02-07 NOTE — Progress Notes (Signed)
Christopher [name] 57 y.o.   Chief Complaint  Patient presents with  . Annual Exam    HISTORY OF PRESENT ILLNESS: This is a 57 y.o. male Here for annual exam; no complaints and no medical concerns.    HPI   Prior to Admission medications   Medication Sig Start Date End Date Taking? Authorizing Provider  azithromycin (ZITHROMAX) 250 MG tablet Sig as indicated Patient not taking: Reported on 02/07/2018 07/29/17   Horald Pollen, MD  triamcinolone (NASACORT) 55 MCG/ACT AERO nasal inhaler Place 2 sprays into the nose daily. Patient not taking: Reported on 02/07/2018 09/20/17   Horald Pollen, MD    Allergies  Allergen Reactions  . Asa Arthritis Strength-Antacid [Aspirin Buffered] Swelling  . Penicillins Hives  . Sulfa Antibiotics Hives    Patient Active Problem List   Diagnosis Date Noted  . Family history of premature CAD 01/13/2016  . Hyperlipidemia 01/13/2016  . Low testosterone in male 05/07/2014  . PVC (premature ventricular contraction) 01/14/2014  . Hearing impaired 12/17/2013  . GERD (gastroesophageal reflux disease) 10/31/2011  . Hypogonadism male 10/31/2011    Past Medical History:  Diagnosis Date  . GERD (gastroesophageal reflux disease) 10/31/2011  . Hearing impaired 12/17/2013   lifelong   . 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 TONSILLECTOMY      Social History   Socioeconomic History  . Marital status: Married    Spouse name: Not on file  . Number of children: 2  . Years of education: Not on file  . Highest education level: Not on file  Occupational History  . Occupation: Chemical engineer: [employer]  . Financial resource strain: Not on file  . Food insecurity:    Worry: Not on file    Inability: Not on file  . Transportation needs:    Medical: Not on file    Non-medical: Not on file  Tobacco Use  . Smoking status: Never Smoker  . Smokeless  tobacco: Never Used  Substance and Sexual Activity  . Alcohol use: No  . Drug use: No  . Sexual activity: Yes  Lifestyle  . Physical activity:    Days per week: Not on file    Minutes per session: Not on file  . Stress: Not on file  Relationships  . Social connections:    Talks on phone: Not on file    Gets together: Not on file    Attends religious service: Not on file    Active member of club or organization: Not on file    Attends meetings of clubs or organizations: Not on file    Relationship status: Not on file  . Intimate partner violence:    Fear of current or ex partner: Not on file    Emotionally abused: Not on file    Physically abused: Not on file    Forced sexual activity: Not on file  Other Topics Concern  . Not on file  Social History Narrative   Married.  Education: Grade School.    Family History  Problem Relation Age of Onset  . Cancer Father   . Hyperlipidemia Father   . Heart disease Mother   . Hyperlipidemia Mother      Review of Systems  Constitutional: Negative.  Negative for chills, fever and weight loss.  HENT: Negative.  Negative for congestion, hearing loss, nosebleeds and sore throat.   Eyes: Negative.  Negative for blurred vision and double vision.  Respiratory: Negative.  Negative for cough and shortness of breath.   Cardiovascular: Negative.  Negative for chest pain, palpitations, claudication and leg swelling.  Gastrointestinal: Negative.  Negative for abdominal pain, blood in stool, diarrhea, melena, nausea and vomiting.  Genitourinary: Negative.  Negative for dysuria and hematuria.  Musculoskeletal: Negative for back pain, myalgias and neck pain.  Skin: Negative.  Negative for rash.  Neurological: Negative.  Negative for dizziness and headaches.  Endo/Heme/Allergies: Negative.   All other systems reviewed and are negative.   Vitals:   02/07/18 0818  BP: 120/74  Pulse: 73  Resp: 20  Temp: 98.3 F (36.8 C)  SpO2: 97%     Physical Exam  Constitutional: He is oriented to person, place, and time. He appears well-developed and well-nourished.  HENT:  Head: Normocephalic and atraumatic.  Right Ear: External ear normal.  Left Ear: External ear normal.  Nose: Nose normal.  Mouth/Throat: Oropharynx is clear and moist.  Eyes: Pupils are equal, round, and reactive to light. Conjunctivae and EOM are normal.  Neck: Normal range of motion. Neck supple. No JVD present. No thyromegaly present.  Cardiovascular: Normal rate, regular rhythm, normal heart sounds and intact distal pulses.  Pulmonary/Chest: Effort normal and breath sounds normal.  Abdominal: Soft. Bowel sounds are normal. He exhibits no distension. There is no tenderness.  Musculoskeletal: Normal range of motion. He exhibits no edema or tenderness.  Lymphadenopathy:    He has no cervical adenopathy.  Neurological: He is alert and oriented to person, place, and time. No sensory deficit. He exhibits normal muscle tone. Coordination normal.  Skin: Skin is warm and dry. Capillary refill takes less than 2 seconds. No rash noted.  Psychiatric: He has a normal mood and affect. His behavior is normal.  Vitals reviewed.   EKG: Normal sinus rhythm with ventricular rate of 59.  No acute ischemic changes.  Normal intervals.  No LVH.  No LAE.  ASSESSMENT & PLAN: Christopher [name] was seen today for annual exam.  Diagnoses and all orders for this visit:  Routine general medical examination at a health care facility -     CBC with Differential -     Comprehensive metabolic panel -     Hemoglobin A1c -     Lipid panel -     PSA(Must document that pt has been informed of limitations of PSA testing.) -     TSH -     EKG 12-Lead -     TestT+TestF+SHBG    Patient Instructions       IF you received an x-ray today, you will receive an invoice from [facility]. Please contact [facility] at [phone] with questions or concerns regarding your  invoice.   IF you received labwork today, you will receive an invoice from [facility]. Please contact [facility] at [phone] with questions or concerns regarding your invoice.   Our billing staff will not be able to assist you with questions regarding bills from these companies.  You will be contacted with the lab results as soon as they are available. The fastest way to get your results is to activate your My Chart account. Instructions are located on the last page of this paperwork. If you have not heard from [place] regarding the results in 2 weeks, please contact this office.      Health Maintenance, Male A healthy lifestyle and preventive care is important for your health and wellness. Ask your health care provider about  what schedule of regular examinations is right for you. What should I know about weight and diet? Eat a Healthy Diet  Eat plenty of vegetables, fruits, whole grains, low-fat dairy products, and lean protein.  Do not eat a lot of foods high in solid fats, added sugars, or salt.  Maintain a Healthy Weight Regular exercise can help you achieve or maintain a healthy weight. You should:  Do at least 150 minutes of exercise each week. The exercise should increase your heart rate and make you sweat (moderate-intensity exercise).  Do strength-training exercises at least twice a week.  Watch Your Levels of Cholesterol and Blood Lipids  Have your blood tested for lipids and cholesterol every 5 years starting at 57 years of age. If you are at high risk for heart disease, you should start having your blood tested when you are 57 years old. You may need to have your cholesterol levels checked more often if: ? Your lipid or cholesterol levels are high. ? You are older than 57 years of age. ? You are at high risk for heart disease.  What should I know about cancer screening? Many types of cancers can be detected early and may often be prevented. Lung Cancer  You should be  screened every year for lung cancer if: ? You are a current smoker who has smoked for at least 30 years. ? You are a former smoker who has quit within the past 15 years.  Talk to your health care provider about your screening options, when you should start screening, and how often you should be screened.  Colorectal Cancer  Routine colorectal cancer screening usually begins at 57 years of age and should be repeated every 5-10 years until you are 57 years old. You may need to be screened more often if early forms of precancerous polyps or small growths are found. Your health care provider may recommend screening at an earlier age if you have risk factors for [name] cancer.  Your health care provider may recommend using home test kits to check for hidden blood in the stool.  A small camera at the end of a tube can be used to examine your [name] (sigmoidoscopy or colonoscopy). This checks for the earliest forms of colorectal cancer.  Prostate and Testicular Cancer  Depending on your age and overall health, your health care provider may do certain tests to screen for prostate and testicular cancer.  Talk to your health care provider about any symptoms or concerns you have about testicular or prostate cancer.  Skin Cancer  Check your skin from head to toe regularly.  Tell your health care provider about any new moles or changes in moles, especially if: ? There is a change in a mole's size, shape, or color. ? You have a mole that is larger than a pencil eraser.  Always use sunscreen. Apply sunscreen liberally and repeat throughout the day.  Protect yourself by wearing long sleeves, pants, a wide-brimmed hat, and sunglasses when outside.  What should I know about heart disease, diabetes, and high blood pressure?  If you are 55-86 years of age, have your blood pressure checked every 3-5 years. If you are 35 years of age or older, have your blood pressure checked every year. You should have  your blood pressure measured twice-once when you are at a hospital or clinic, and once when you are not at a hospital or clinic. Record the average of the two measurements. To check your blood pressure when  you are not at a hospital or clinic, you can use: ? An automated blood pressure machine at a pharmacy. ? A home blood pressure monitor.  Talk to your health care provider about your target blood pressure.  If you are between 96-38 years old, ask your health care provider if you should take aspirin to prevent heart disease.  Have regular diabetes screenings by checking your fasting blood sugar level. ? If you are at a normal weight and have a low risk for diabetes, have this test once every three years after the age of 54. ? If you are overweight and have a high risk for diabetes, consider being tested at a younger age or more often.  A one-time screening for abdominal aortic aneurysm (AAA) by ultrasound is recommended for men aged 33-75 years who are current or former smokers. What should I know about preventing infection? Hepatitis B If you have a higher risk for hepatitis B, you should be screened for this virus. Talk with your health care provider to find out if you are at risk for hepatitis B infection. Hepatitis C Blood testing is recommended for:  Everyone born from [birth year] through 1965.  Anyone with known risk factors for hepatitis C.  Sexually Transmitted Diseases (STDs)  You should be screened each year for STDs including gonorrhea and chlamydia if: ? You are sexually active and are younger than 57 years of age. ? You are older than 57 years of age and your health care provider tells you that you are at risk for this type of infection. ? Your sexual activity has changed since you were last screened and you are at an increased risk for chlamydia or gonorrhea. Ask your health care provider if you are at risk.  Talk with your health care provider about whether you are at high risk  of being infected with HIV. Your health care provider may recommend a prescription medicine to help prevent HIV infection.  What else can I do?  Schedule regular health, dental, and eye exams.  Stay current with your vaccines (immunizations).  Do not use any tobacco products, such as cigarettes, chewing tobacco, and e-cigarettes. If you need help quitting, ask your health care provider.  Limit alcohol intake to no more than 2 drinks per day. One drink equals 12 ounces of beer, 5 ounces of wine, or 1 ounces of hard liquor.  Do not use street drugs.  Do not share needles.  Ask your health care provider for help if you need support or information about quitting drugs.  Tell your health care provider if you often feel depressed.  Tell your health care provider if you have ever been abused or do not feel safe at home. This information is not intended to replace advice given to you by your health care provider. Make sure you discuss any questions you have with your health care provider. Document Released: 03/01/2008 Document Revised: 05/02/2016 Document Reviewed: 06/07/2015 Elsevier Interactive Patient Education  2018 [employer]      Agustina Caroli, MD [facility]

## 2018-02-08 ENCOUNTER — Encounter: Payer: Self-pay | Admitting: Emergency Medicine

## 2018-02-11 LAB — CBC WITH DIFFERENTIAL/PLATELET
BASOS ABS: 0 10*3/uL (ref 0.0–0.2)
Basos: 0 %
EOS (ABSOLUTE): 0.1 10*3/uL (ref 0.0–0.4)
Eos: 2 %
HEMOGLOBIN: 14.2 g/dL (ref 13.0–17.7)
Hematocrit: 42.5 % (ref 37.5–51.0)
Immature Grans (Abs): 0 10*3/uL (ref 0.0–0.1)
Immature Granulocytes: 0 %
LYMPHS ABS: 2.2 10*3/uL (ref 0.7–3.1)
Lymphs: 42 %
MCH: 28.7 pg (ref 26.6–33.0)
MCHC: 33.4 g/dL (ref 31.5–35.7)
MCV: 86 fL (ref 79–97)
MONOCYTES: 8 %
Monocytes Absolute: 0.4 10*3/uL (ref 0.1–0.9)
NEUTROS ABS: 2.4 10*3/uL (ref 1.4–7.0)
Neutrophils: 48 %
Platelets: 244 10*3/uL (ref 150–450)
RBC: 4.94 x10E6/uL (ref 4.14–5.80)
RDW: 13.9 % (ref 12.3–15.4)
WBC: 5.1 10*3/uL (ref 3.4–10.8)

## 2018-02-11 LAB — PSA: PROSTATE SPECIFIC AG, SERUM: 1.6 ng/mL (ref 0.0–4.0)

## 2018-02-11 LAB — COMPREHENSIVE METABOLIC PANEL
ALBUMIN: 4.3 g/dL (ref 3.5–5.5)
ALK PHOS: 78 IU/L (ref 39–117)
ALT: 20 IU/L (ref 0–44)
AST: 22 IU/L (ref 0–40)
Albumin/Globulin Ratio: 1.4 (ref 1.2–2.2)
BILIRUBIN TOTAL: 0.8 mg/dL (ref 0.0–1.2)
BUN / CREAT RATIO: 12 (ref 9–20)
BUN: 14 mg/dL (ref 6–24)
CHLORIDE: 106 mmol/L (ref 96–106)
CO2: 21 mmol/L (ref 20–29)
Calcium: 9.3 mg/dL (ref 8.7–10.2)
Creatinine, Ser: 1.14 mg/dL (ref 0.76–1.27)
GFR calc Af Amer: 83 mL/min/{1.73_m2} (ref >59)
GFR calc non Af Amer: 71 mL/min/{1.73_m2} (ref >59)
GLOBULIN, TOTAL: 3 g/dL (ref 1.5–4.5)
GLUCOSE: 99 mg/dL (ref 65–99)
Potassium: 4.4 mmol/L (ref 3.5–5.2)
SODIUM: 142 mmol/L (ref 134–144)
Total Protein: 7.3 g/dL (ref 6.0–8.5)

## 2018-02-11 LAB — LIPID PANEL
CHOL/HDL RATIO: 4.2 ratio (ref 0.0–5.0)
Cholesterol, Total: 200 mg/dL — ABNORMAL HIGH (ref 100–199)
HDL: 48 mg/dL (ref >39)
LDL Calculated: 135 mg/dL — ABNORMAL HIGH (ref 0–99)
Triglycerides: 87 mg/dL (ref 0–149)
VLDL Cholesterol Cal: 17 mg/dL (ref 5–40)

## 2018-02-11 LAB — TESTT+TESTF+SHBG
Sex Hormone Binding: 25.8 nmol/L (ref 19.3–76.4)
TESTOSTERONE FREE: 4.5 pg/mL — AB (ref 7.2–24.0)
TESTOSTERONE, TOTAL: 253.8 ng/dL — AB (ref 264.0–916.0)

## 2018-02-11 LAB — HEMOGLOBIN A1C
ESTIMATED AVERAGE GLUCOSE: 117 mg/dL
HEMOGLOBIN A1C: 5.7 % — AB (ref 4.8–5.6)

## 2018-02-11 LAB — TSH: TSH: 1.96 u[IU]/mL (ref 0.450–4.500)

## 2018-03-24 ENCOUNTER — Ambulatory Visit: Payer: 59 | Admitting: Family Medicine

## 2018-03-24 ENCOUNTER — Other Ambulatory Visit: Payer: Self-pay

## 2018-03-24 ENCOUNTER — Encounter: Payer: Self-pay | Admitting: Family Medicine

## 2018-03-24 VITALS — BP 128/67 | HR 63 | Temp 98.4°F | Ht 70.0 in | Wt 250.6 lb

## 2018-03-24 DIAGNOSIS — M5432 Sciatica, left side: Secondary | ICD-10-CM | POA: Diagnosis not present

## 2018-03-24 MED ORDER — CYCLOBENZAPRINE HCL 5 MG PO TABS
ORAL_TABLET | ORAL | 0 refills | Status: DC
Start: 2018-03-24 — End: 2018-06-06

## 2018-03-24 MED ORDER — PREDNISONE 20 MG PO TABS: 40.0000 mg | ORAL_TABLET | Freq: Every day | ORAL | 0 refills | Status: DC

## 2018-03-24 NOTE — Patient Instructions (Addendum)
Your pain sounds like possible sciatica based on symptoms today.  Can try the muscle relaxant up to every 8 hours, but start that at bedtime due to sedation.  If not improving in the next 4 to 5 days, can start prednisone as we discussed.  Follow-up in the next 10 days if not improving.    I do not think x-rays are  needed at this time, but if pain is not improving in the next week to 10 days, please return for recheck and possible x-ray at that time.  Please return sooner if worse.   Sciatica Sciatica is pain, numbness, weakness, or tingling along the path of the sciatic nerve. The sciatic nerve starts in the lower back and runs down the back of each leg. The nerve controls the muscles in the lower leg and in the back of the knee. It also provides feeling (sensation) to the back of the thigh, the lower leg, and the sole of the foot. Sciatica is a symptom of another medical condition that pinches or puts pressure on the sciatic nerve. Generally, sciatica only affects one side of the body. Sciatica usually goes away on its own or with treatment. In some cases, sciatica may keep coming back (recur). What are the causes? This condition is caused by pressure on the sciatic nerve, or pinching of the sciatic nerve. This may be the result of:  A disk in between the bones of the spine (vertebrae) bulging out too far (herniated disk).  Age-related changes in the spinal disks (degenerative disk disease).  A pain disorder that affects a muscle in the buttock (piriformis syndrome).  Extra bone growth (bone spur) near the sciatic nerve.  An injury or break (fracture) of the pelvis.  Pregnancy.  Tumor (rare).  What increases the risk? The following factors may make you more likely to develop this condition:  Playing sports that place pressure or stress on the spine, such as football or weight lifting.  Having poor strength and flexibility.  A history of back injury.  A history of back  surgery.  Sitting for long periods of time.  Doing activities that involve repetitive bending or lifting.  Obesity.  What are the signs or symptoms? Symptoms can vary from mild to very severe, and they may include:  Any of these problems in the lower back, leg, hip, or buttock: ? Mild tingling or dull aches. ? Burning sensations. ? Sharp pains.  Numbness in the back of the calf or the sole of the foot.  Leg weakness.  Severe back pain that makes movement difficult.  These symptoms may get worse when you cough, sneeze, or laugh, or when you sit or stand for long periods of time. Being overweight may also make symptoms worse. In some cases, symptoms may recur over time. How is this diagnosed? This condition may be diagnosed based on:  Your symptoms.  A physical exam. Your health care provider may ask you to do certain movements to check whether those movements trigger your symptoms.  You may have tests, including: ? Blood tests. ? X-rays. ? MRI. ? CT scan.  How is this treated? In many cases, this condition improves on its own, without any treatment. However, treatment may include:  Reducing or modifying physical activity during periods of pain.  Exercising and stretching to strengthen your abdomen and improve the flexibility of your spine.  Icing and applying heat to the affected area.  Medicines that help: ? To relieve pain and swelling. ?  To relax your muscles.  Injections of medicines that help to relieve pain, irritation, and inflammation around the sciatic nerve (steroids).  Surgery.  Follow these instructions at home: Medicines  Take over-the-counter and prescription medicines only as told by your health care provider.  Do not drive or operate heavy machinery while taking prescription pain medicine. Managing pain  If directed, apply ice to the affected area. ? Put ice in a plastic bag. ? Place a towel between your skin and the bag. ? Leave the ice  on for 20 minutes, 2-3 times a day.  After icing, apply heat to the affected area before you exercise or as often as told by your health care provider. Use the heat source that your health care provider recommends, such as a moist heat pack or a heating pad. ? Place a towel between your skin and the heat source. ? Leave the heat on for 20-30 minutes. ? Remove the heat if your skin turns bright red. This is especially important if you are unable to feel pain, heat, or cold. You may have a greater risk of getting burned. Activity  Return to your normal activities as told by your health care provider. Ask your health care provider what activities are safe for you. ? Avoid activities that make your symptoms worse.  Take brief periods of rest throughout the day. Resting in a lying or standing position is usually better than sitting to rest. ? When you rest for longer periods, mix in some mild activity or stretching between periods of rest. This will help to prevent stiffness and pain. ? Avoid sitting for long periods of time without moving. Get up and move around at least one time each hour.  Exercise and stretch regularly, as told by your health care provider.  Do not lift anything that is heavier than 10 lb (4.5 kg) while you have symptoms of sciatica. When you do not have symptoms, you should still avoid heavy lifting, especially repetitive heavy lifting.  When you lift objects, always use proper lifting technique, which includes: ? Bending your knees. ? Keeping the load close to your body. ? Avoiding twisting. General instructions  Use good posture. ? Avoid leaning forward while sitting. ? Avoid hunching over while standing.  Maintain a healthy weight. Excess weight puts extra stress on your back and makes it difficult to maintain good posture.  Wear supportive, comfortable shoes. Avoid wearing high heels.  Avoid sleeping on a mattress that is too soft or too hard. A mattress that is  firm enough to support your back when you sleep may help to reduce your pain.  Keep all follow-up visits as told by your health care provider. This is important. Contact a health care provider if:  You have pain that wakes you up when you are sleeping.  You have pain that gets worse when you lie down.  Your pain is worse than you have experienced in the past.  Your pain lasts longer than 4 weeks.  You experience unexplained weight loss. Get help right away if:  You lose control of your bowel or bladder (incontinence).  You have: ? Weakness in your lower back, pelvis, buttocks, or legs that gets worse. ? Redness or swelling of your back. ? A burning sensation when you urinate. This information is not intended to replace advice given to you by your health care provider. Make sure you discuss any questions you have with your health care provider. Document Released: 08/28/2001 Document Revised: 02/07/2016  Document Reviewed: 05/13/2015 Elsevier Interactive Patient Education  [employer].   Sciatica Rehab Ask your health care provider which exercises are safe for you. Do exercises exactly as told by your health care provider and adjust them as directed. It is normal to feel mild stretching, pulling, tightness, or discomfort as you do these exercises, but you should stop right away if you feel sudden pain or your pain gets worse.Do not begin these exercises until told by your health care provider. Stretching and range of motion exercises These exercises warm up your muscles and joints and improve the movement and flexibility of your hips and your back. These exercises also help to relieve pain, numbness, and tingling. Exercise A: Sciatic nerve glide 1. Sit in a chair with your head facing down toward your chest. Place your hands behind your back. Let your shoulders slump forward. 2. Slowly straighten one of your knees while you tilt your head back as if you are looking toward the  ceiling. Only straighten your leg as far as you can without making your symptoms worse. 3. Hold for __________ seconds. 4. Slowly return to the starting position. 5. Repeat with your other leg. Repeat __________ times. Complete this exercise __________ times a day. Exercise B: Knee to chest with hip adduction and internal rotation  1. Lie on your back on a firm surface with both legs straight. 2. Bend one of your knees and move it up toward your chest until you feel a gentle stretch in your lower back and buttock. Then, move your knee toward the shoulder that is on the opposite side from your leg. ? Hold your leg in this position by holding onto the front of your knee. 3. Hold for __________ seconds. 4. Slowly return to the starting position. 5. Repeat with your other leg. Repeat __________ times. Complete this exercise __________ times a day. Exercise C: Prone extension on elbows  1. Lie on your abdomen on a firm surface. A bed may be too soft for this exercise. 2. Prop yourself up on your elbows. 3. Use your arms to help lift your chest up until you feel a gentle stretch in your abdomen and your lower back. ? This will place some of your body weight on your elbows. If this is uncomfortable, try stacking pillows under your chest. ? Your hips should stay down, against the surface that you are lying on. Keep your hip and back muscles relaxed. 4. Hold for __________ seconds. 5. Slowly relax your upper body and return to the starting position. Repeat __________ times. Complete this exercise __________ times a day. Strengthening exercises These exercises build strength and endurance in your back. Endurance is the ability to use your muscles for a long time, even after they get tired. Exercise D: Pelvic tilt 1. Lie on your back on a firm surface. Bend your knees and keep your feet flat. 2. Tense your abdominal muscles. Tip your pelvis up toward the ceiling and flatten your lower back into the  floor. ? To help with this exercise, you may place a small towel under your lower back and try to push your back into the towel. 3. Hold for __________ seconds. 4. Let your muscles relax completely before you repeat this exercise. Repeat __________ times. Complete this exercise __________ times a day. Exercise E: Alternating arm and leg raises  1. Get on your hands and knees on a firm surface. If you are on a hard floor, you may want to use padding  to cushion your knees, such as an exercise mat. 2. Line up your arms and legs. Your hands should be below your shoulders, and your knees should be below your hips. 3. Lift your left leg behind you. At the same time, raise your right arm and straighten it in front of you. ? Do not lift your leg higher than your hip. ? Do not lift your arm higher than your shoulder. ? Keep your abdominal and back muscles tight. ? Keep your hips facing the ground. ? Do not arch your back. ? Keep your balance carefully, and do not hold your breath. 4. Hold for __________ seconds. 5. Slowly return to the starting position and repeat with your right leg and your left arm. Repeat __________ times. Complete this exercise __________ times a day. Posture and body mechanics  Body mechanics refers to the movements and positions of your body while you do your daily activities. Posture is part of body mechanics. Good posture and healthy body mechanics can help to relieve stress in your body's tissues and joints. Good posture means that your spine is in its natural S-curve position (your spine is neutral), your shoulders are pulled back slightly, and your head is not tipped forward. The following are general guidelines for applying improved posture and body mechanics to your everyday activities. Standing   When standing, keep your spine neutral and your feet about hip-width apart. Keep a slight bend in your knees. Your ears, shoulders, and hips should line up.  When you do a  task in which you stand in one place for a long time, place one foot up on a stable object that is 2-4 inches (5-10 cm) high, such as a footstool. This helps keep your spine neutral. Sitting   When sitting, keep your spine neutral and keep your feet flat on the floor. Use a footrest, if necessary, and keep your thighs parallel to the floor. Avoid rounding your shoulders, and avoid tilting your head forward.  When working at a desk or a computer, keep your desk at a height where your hands are slightly lower than your elbows. Slide your chair under your desk so you are close enough to maintain good posture.  When working at a computer, place your monitor at a height where you are looking straight ahead and you do not have to tilt your head forward or downward to look at the screen. Resting   When lying down and resting, avoid positions that are most painful for you.  If you have pain with activities such as sitting, bending, stooping, or squatting (flexion-based activities), lie in a position in which your body does not bend very much. For example, avoid curling up on your side with your arms and knees near your chest (fetal position).  If you have pain with activities such as standing for a long time or reaching with your arms (extension-based activities), lie with your spine in a neutral position and bend your knees slightly. Try the following positions: ? Lying on your side with a pillow between your knees. ? Lying on your back with a pillow under your knees. Lifting   When lifting objects, keep your feet at least shoulder-width apart and tighten your abdominal muscles.  Bend your knees and hips and keep your spine neutral. It is important to lift using the strength of your legs, not your back. Do not lock your knees straight out.  Always ask for help to lift heavy or awkward objects. This information  is not intended to replace advice given to you by your health care provider. Make sure  you discuss any questions you have with your health care provider. Document Released: 09/03/2005 Document Revised: 05/10/2016 Document Reviewed: 05/20/2015 Elsevier Interactive Patient Education  2018 [employer].   IF you received an x-ray today, you will receive an invoice from [facility]. Please contact [facility] at [phone] with questions or concerns regarding your invoice.   IF you received labwork today, you will receive an invoice from [facility]. Please contact [facility] at [phone] with questions or concerns regarding your invoice.   Our billing staff will not be able to assist you with questions regarding bills from these companies.  You will be contacted with the lab results as soon as they are available. The fastest way to get your results is to activate your My Chart account. Instructions are located on the last page of this paperwork. If you have not heard from [place] regarding the results in 2 weeks, please contact this office.

## 2018-03-24 NOTE — Progress Notes (Signed)
Subjective:  By signing my name below, I, Christopher [name], attest that this documentation has been prepared under the direction and in the presence of Merri Ray, MD. Electronically Signed: Moises [name], [place]. 03/24/2018 , 5:39 PM .  Patient was seen in Room 11 .   Patient ID: Christopher [name], male    DOB: 09/01/1961, 57 y.o.   MRN: [id number] Chief Complaint  Patient presents with  . left leg pain    going on 5 days. Hip to calf. sharp pain that hurts when he walks and sit. laying down relieves the pain    HPI Christopher [name] is a 57 y.o. male  Patient reports having pain from his left hip to his left calf which started about 5 days ago. He noticed most pain standing after sitting, notes pain initially with first 10 steps, but improves some afterwards. He also informs pain when lifting his left leg up when sitting down. He believes he had similar symptoms in the past for about 3-4 days in the past but resolved. When he's laying down to sleep at night, he denies having any pain. He denies urinary or bowel incontinence, saddle anesthesia, or back pain. He denies any new exercises or any new activities. He denies any falls, or injuries. He denies any recent traveling or new projects at home. He hasn't taken any medications for this. He works as a Dance movement psychotherapist.   He has swelling with aspirin. He denies history of diabetes.   Patient Active Problem List   Diagnosis Date Noted  . Family history of premature CAD 01/13/2016  . Hyperlipidemia 01/13/2016  . Low testosterone in male 05/07/2014  . PVC (premature ventricular contraction) 01/14/2014  . Hearing impaired 12/17/2013  . GERD (gastroesophageal reflux disease) 10/31/2011  . Hypogonadism male 10/31/2011   Past Medical History:  Diagnosis Date  . GERD (gastroesophageal reflux disease) 10/31/2011  . Hearing impaired 12/17/2013   lifelong   . Hypogonadism male 10/31/2011   Past Surgical History:  Procedure  Laterality Date  . TONSILLECTOMY     Allergies  Allergen Reactions  . Asa Arthritis Strength-Antacid [Aspirin Buffered] Swelling  . Penicillins Hives  . Sulfa Antibiotics Hives   Prior to Admission medications   Not on File   Social History   Socioeconomic History  . Marital status: Married    Spouse name: Not on file  . Number of children: 2  . Years of education: Not on file  . Highest education level: Not on file  Occupational History  . Occupation: Chemical engineer: [employer]  . Financial resource strain: Not on file  . Food insecurity:    Worry: Not on file    Inability: Not on file  . Transportation needs:    Medical: Not on file    Non-medical: Not on file  Tobacco Use  . Smoking status: Never Smoker  . Smokeless tobacco: Never Used  Substance and Sexual Activity  . Alcohol use: No  . Drug use: No  . Sexual activity: Yes  Lifestyle  . Physical activity:    Days per week: Not on file    Minutes per session: Not on file  . Stress: Not on file  Relationships  . Social connections:    Talks on phone: Not on file    Gets together: Not on file    Attends religious service: Not on file    Active member of club or organization: Not  on file    Attends meetings of clubs or organizations: Not on file    Relationship status: Not on file  . Intimate partner violence:    Fear of current or ex partner: Not on file    Emotionally abused: Not on file    Physically abused: Not on file    Forced sexual activity: Not on file  Other Topics Concern  . Not on file  Social History Narrative   Married.  Education: Grade School.   Review of Systems  Constitutional: Negative for fatigue and unexpected weight change.  Eyes: Negative for visual disturbance.  Respiratory: Negative for cough, chest tightness and shortness of breath.   Cardiovascular: Negative for chest pain, palpitations and leg swelling.  Gastrointestinal: Negative for  abdominal pain and [name] in stool.  Musculoskeletal: Positive for myalgias. Negative for back pain.  Neurological: Negative for dizziness, weakness, light-headedness and headaches.       Objective:   Physical Exam  Constitutional: He is oriented to person, place, and time. He appears well-developed and well-nourished. No distress.  HENT:  Head: Normocephalic and atraumatic.  Eyes: Pupils are equal, round, and reactive to light. EOM are normal.  Neck: Neck supple.  Cardiovascular: Normal rate.  Pulmonary/Chest: Effort normal. No respiratory distress.  Musculoskeletal: Normal range of motion.  Lumbar spine: no midline bony tenderness, able to heel-toe walk without difficulty; tender at the left sciatic notch, positive seated straight leg raise with radiating pain down left leg; same distribution of pain with forward flexion, rotation intact, lateral flexion intact Left hip: negative piriformis stretch  Neurological: He is alert and oriented to person, place, and time.  Reflex Scores:      Patellar reflexes are 2+ on the right side and 2+ on the left side.      Achilles reflexes are 2+ on the right side and 2+ on the left side. Skin: Skin is warm and dry.  Psychiatric: He has a normal mood and affect. His behavior is normal.  Nursing note and vitals reviewed.   Vitals:   03/24/18 1655  BP: 128/67  Pulse: 63  Temp: 98.4 F (36.9 C)  TempSrc: Oral  SpO2: 93%  Weight: 250 lb 9.6 oz (113.7 kg)  Height: 5\' 10"  (1.778 m)       Assessment & Plan:    [name] is a 57 y.o. male Left sided sciatica - Plan: predniSONE (DELTASONE) 20 MG tablet, cyclobenzaprine (FLEXERIL) 5 MG tablet Based on exam and symptoms, suspected left-sided sciatica.  No known injury, no red flags on exam or history, initial imaging deferred.  -Trial of Flexeril 5 mg 3 times daily as needed, initially nightly due to side effects that were discussed.  If not improving in 4 to 5 days, start  prednisone.  Potential side effects of prednisone were also discussed   -Handout given on sciatica as well as rehab for sciatica as requested. RTC precautions given.   Meds ordered this encounter  Medications  . predniSONE (DELTASONE) 20 MG tablet    Sig: Take 2 tablets (40 mg total) by mouth daily with breakfast.    Dispense:  10 tablet    Refill:  0  . cyclobenzaprine (FLEXERIL) 5 MG tablet    Sig: 1 pill by mouth up to every 8 hours as needed. Start with one pill by mouth each bedtime as needed due to sedation    Dispense:  15 tablet    Refill:  0   Patient Instructions  Your pain sounds like possible sciatica based on symptoms today.  Can try the muscle relaxant up to every 8 hours, but start that at bedtime due to sedation.  If not improving in the next 4 to 5 days, can start prednisone as we discussed.  Follow-up in the next 10 days if not improving.    I do not think x-rays are  needed at this time, but if pain is not improving in the next week to 10 days, please return for recheck and possible x-ray at that time.  Please return sooner if worse.   Sciatica Sciatica is pain, numbness, weakness, or tingling along the path of the sciatic nerve. The sciatic nerve starts in the lower back and runs down the back of each leg. The nerve controls the muscles in the lower leg and in the back of the knee. It also provides feeling (sensation) to the back of the thigh, the lower leg, and the sole of the foot. Sciatica is a symptom of another medical condition that pinches or puts pressure on the sciatic nerve. Generally, sciatica only affects one side of the body. Sciatica usually goes away on its own or with treatment. In some cases, sciatica may keep coming back (recur). What are the causes? This condition is caused by pressure on the sciatic nerve, or pinching of the sciatic nerve. This may be the result of:  A disk in between the bones of the spine (vertebrae) bulging out too far  (herniated disk).  Age-related changes in the spinal disks (degenerative disk disease).  A pain disorder that affects a muscle in the buttock (piriformis syndrome).  Extra bone growth (bone spur) near the sciatic nerve.  An injury or break (fracture) of the pelvis.  Pregnancy.  Tumor (rare).  What increases the risk? The following factors may make you more likely to develop this condition:  Playing sports that place pressure or stress on the spine, such as football or weight lifting.  Having poor strength and flexibility.  A history of back injury.  A history of back surgery.  Sitting for long periods of time.  Doing activities that involve repetitive bending or lifting.  Obesity.  What are the signs or symptoms? Symptoms can vary from mild to very severe, and they may include:  Any of these problems in the lower back, leg, hip, or buttock: ? Mild tingling or dull aches. ? Burning sensations. ? Sharp pains.  Numbness in the back of the calf or the sole of the foot.  Leg weakness.  Severe back pain that makes movement difficult.  These symptoms may get worse when you cough, sneeze, or laugh, or when you sit or stand for long periods of time. Being overweight may also make symptoms worse. In some cases, symptoms may recur over time. How is this diagnosed? This condition may be diagnosed based on:  Your symptoms.  A physical exam. Your health care provider may ask you to do certain movements to check whether those movements trigger your symptoms.  You may have tests, including: ? [name] tests. ? X-rays. ? MRI. ? CT scan.  How is this treated? In many cases, this condition improves on its own, without any treatment. However, treatment may include:  Reducing or modifying physical activity during periods of pain.  Exercising and stretching to strengthen your abdomen and improve the flexibility of your spine.  Icing and applying heat to the affected  area.  Medicines that help: ? To relieve pain and swelling. ?  To relax your muscles.  Injections of medicines that help to relieve pain, irritation, and inflammation around the sciatic nerve (steroids).  Surgery.  Follow these instructions at home: Medicines  Take over-the-counter and prescription medicines only as told by your health care provider.  Do not drive or operate heavy machinery while taking prescription pain medicine. Managing pain  If directed, apply ice to the affected area. ? Put ice in a plastic bag. ? Place a towel between your skin and the bag. ? Leave the ice on for 20 minutes, 2-3 times a day.  After icing, apply heat to the affected area before you exercise or as often as told by your health care provider. Use the heat source that your health care provider recommends, such as a moist heat pack or a heating pad. ? Place a towel between your skin and the heat source. ? Leave the heat on for 20-30 minutes. ? Remove the heat if your skin turns bright red. This is especially important if you are unable to feel pain, heat, or cold. You may have a greater risk of getting burned. Activity  Return to your normal activities as told by your health care provider. Ask your health care provider what activities are safe for you. ? Avoid activities that make your symptoms worse.  Take brief periods of rest throughout the day. Resting in a lying or standing position is usually better than sitting to rest. ? When you rest for longer periods, mix in some mild activity or stretching between periods of rest. This will help to prevent stiffness and pain. ? Avoid sitting for long periods of time without moving. Get up and move around at least one time each hour.  Exercise and stretch regularly, as told by your health care provider.  Do not lift anything that is heavier than 10 lb (4.5 kg) while you have symptoms of sciatica. When you do not have symptoms, you should still avoid  heavy lifting, especially repetitive heavy lifting.  When you lift objects, always use proper lifting technique, which includes: ? Bending your knees. ? Keeping the load close to your body. ? Avoiding twisting. General instructions  Use good posture. ? Avoid leaning forward while sitting. ? Avoid hunching over while standing.  Maintain a healthy weight. Excess weight puts extra stress on your back and makes it difficult to maintain good posture.  Wear supportive, comfortable shoes. Avoid wearing high heels.  Avoid sleeping on a mattress that is too soft or too hard. A mattress that is firm enough to support your back when you sleep may help to reduce your pain.  Keep all follow-up visits as told by your health care provider. This is important. Contact a health care provider if:  You have pain that wakes you up when you are sleeping.  You have pain that gets worse when you lie down.  Your pain is worse than you have experienced in the past.  Your pain lasts longer than 4 weeks.  You experience unexplained weight loss. Get help right away if:  You lose control of your bowel or bladder (incontinence).  You have: ? Weakness in your lower back, pelvis, buttocks, or legs that gets worse. ? Redness or swelling of your back. ? A burning sensation when you urinate. This information is not intended to replace advice given to you by your health care provider. Make sure you discuss any questions you have with your health care provider. Document Released: 08/28/2001 Document Revised: 02/07/2016 Document  Reviewed: 05/13/2015 Elsevier Interactive Patient Education  2018 [employer].   Sciatica Rehab Ask your health care provider which exercises are safe for you. Do exercises exactly as told by your health care provider and adjust them as directed. It is normal to feel mild stretching, pulling, tightness, or discomfort as you do these exercises, but you should stop right away if you  feel sudden pain or your pain gets worse.Do not begin these exercises until told by your health care provider. Stretching and range of motion exercises These exercises warm up your muscles and joints and improve the movement and flexibility of your hips and your back. These exercises also help to relieve pain, numbness, and tingling. Exercise A: Sciatic nerve glide 1. Sit in a chair with your head facing down toward your chest. Place your hands behind your back. Let your shoulders slump forward. 2. Slowly straighten one of your knees while you tilt your head back as if you are looking toward the ceiling. Only straighten your leg as far as you can without making your symptoms worse. 3. Hold for __________ seconds. 4. Slowly return to the starting position. 5. Repeat with your other leg. Repeat __________ times. Complete this exercise __________ times a day. Exercise B: Knee to chest with hip adduction and internal rotation  1. Lie on your back on a firm surface with both legs straight. 2. Bend one of your knees and move it up toward your chest until you feel a gentle stretch in your lower back and buttock. Then, move your knee toward the shoulder that is on the opposite side from your leg. ? Hold your leg in this position by holding onto the front of your knee. 3. Hold for __________ seconds. 4. Slowly return to the starting position. 5. Repeat with your other leg. Repeat __________ times. Complete this exercise __________ times a day. Exercise C: Prone extension on elbows  1. Lie on your abdomen on a firm surface. A bed may be too soft for this exercise. 2. Prop yourself up on your elbows. 3. Use your arms to help lift your chest up until you feel a gentle stretch in your abdomen and your lower back. ? This will place some of your body weight on your elbows. If this is uncomfortable, try stacking pillows under your chest. ? Your hips should stay down, against the surface that you are lying  on. Keep your hip and back muscles relaxed. 4. Hold for __________ seconds. 5. Slowly relax your upper body and return to the starting position. Repeat __________ times. Complete this exercise __________ times a day. Strengthening exercises These exercises build strength and endurance in your back. Endurance is the ability to use your muscles for a long time, even after they get tired. Exercise D: Pelvic tilt 1. Lie on your back on a firm surface. Bend your knees and keep your feet flat. 2. Tense your abdominal muscles. Tip your pelvis up toward the ceiling and flatten your lower back into the floor. ? To help with this exercise, you may place a small towel under your lower back and try to push your back into the towel. 3. Hold for __________ seconds. 4. Let your muscles relax completely before you repeat this exercise. Repeat __________ times. Complete this exercise __________ times a day. Exercise E: Alternating arm and leg raises  1. Get on your hands and knees on a firm surface. If you are on a hard floor, you may want to use padding to  cushion your knees, such as an exercise mat. 2. Line up your arms and legs. Your hands should be below your shoulders, and your knees should be below your hips. 3. Lift your left leg behind you. At the same time, raise your right arm and straighten it in front of you. ? Do not lift your leg higher than your hip. ? Do not lift your arm higher than your shoulder. ? Keep your abdominal and back muscles tight. ? Keep your hips facing the ground. ? Do not arch your back. ? Keep your balance carefully, and do not hold your breath. 4. Hold for __________ seconds. 5. Slowly return to the starting position and repeat with your right leg and your left arm. Repeat __________ times. Complete this exercise __________ times a day. Posture and body mechanics  Body mechanics refers to the movements and positions of your body while you do your daily activities.  Posture is part of body mechanics. Good posture and healthy body mechanics can help to relieve stress in your body's tissues and joints. Good posture means that your spine is in its natural S-curve position (your spine is neutral), your shoulders are pulled back slightly, and your head is not tipped forward. The following are general guidelines for applying improved posture and body mechanics to your everyday activities. Standing   When standing, keep your spine neutral and your feet about hip-width apart. Keep a slight bend in your knees. Your ears, shoulders, and hips should line up.  When you do a task in which you stand in one place for a long time, place one foot up on a stable object that is 2-4 inches (5-10 cm) high, such as a footstool. This helps keep your spine neutral. Sitting   When sitting, keep your spine neutral and keep your feet flat on the floor. Use a footrest, if necessary, and keep your thighs parallel to the floor. Avoid rounding your shoulders, and avoid tilting your head forward.  When working at a desk or a computer, keep your desk at a height where your hands are slightly lower than your elbows. Slide your chair under your desk so you are close enough to maintain good posture.  When working at a computer, place your monitor at a height where you are looking straight ahead and you do not have to tilt your head forward or downward to look at the screen. Resting   When lying down and resting, avoid positions that are most painful for you.  If you have pain with activities such as sitting, bending, stooping, or squatting (flexion-based activities), lie in a position in which your body does not bend very much. For example, avoid curling up on your side with your arms and knees near your chest (fetal position).  If you have pain with activities such as standing for a long time or reaching with your arms (extension-based activities), lie with your spine in a neutral position  and bend your knees slightly. Try the following positions: ? Lying on your side with a pillow between your knees. ? Lying on your back with a pillow under your knees. Lifting   When lifting objects, keep your feet at least shoulder-width apart and tighten your abdominal muscles.  Bend your knees and hips and keep your spine neutral. It is important to lift using the strength of your legs, not your back. Do not lock your knees straight out.  Always ask for help to lift heavy or awkward objects. This information  is not intended to replace advice given to you by your health care provider. Make sure you discuss any questions you have with your health care provider. Document Released: 09/03/2005 Document Revised: 05/10/2016 Document Reviewed: 05/20/2015 Elsevier Interactive Patient Education  2018 [employer].   IF you received an x-ray today, you will receive an invoice from [facility]. Please contact [facility] at [phone] with questions or concerns regarding your invoice.   IF you received labwork today, you will receive an invoice from [facility]. Please contact [facility] at [phone] with questions or concerns regarding your invoice.   Our billing staff will not be able to assist you with questions regarding bills from these companies.  You will be contacted with the lab results as soon as they are available. The fastest way to get your results is to activate your My Chart account. Instructions are located on the last page of this paperwork. If you have not heard from [place] regarding the results in 2 weeks, please contact this office.       I personally performed the services described in this documentation, which was scribed in my presence. The recorded information has been reviewed and considered for accuracy and completeness, addended by me as needed, and agree with information above.  Signed,   Merri Ray, MD [facility].  03/26/18 3:04 PM

## 2018-03-31 ENCOUNTER — Telehealth: Payer: Self-pay | Admitting: Emergency Medicine

## 2018-03-31 DIAGNOSIS — M5432 Sciatica, left side: Secondary | ICD-10-CM

## 2018-03-31 MED ORDER — PREDNISONE 20 MG PO TABS: 40.0000 mg | ORAL_TABLET | Freq: Every day | ORAL | 0 refills | Status: DC

## 2018-03-31 NOTE — Telephone Encounter (Signed)
Copied from [place] [id number]. Topic: Quick Communication - Rx Refill/Question >> Mar 31, 2018  5:23 PM Oliver Pila B wrote: Medication was never called in  Medication: predniSONE (DELTASONE) 20 MG tablet [[id number]]   Has the patient contacted their pharmacy? Yes.   (Agent: If no, request that the patient contact the pharmacy for the refill.) (Agent: If yes, when and what did the pharmacy advise?)  Preferred Pharmacy (with phone number or street name): [facility]  Agent: Please be advised that RX refills may take up to 3 business days. We ask that you follow-up with your pharmacy.

## 2018-03-31 NOTE — Telephone Encounter (Signed)
Pharmacy did not receive, because it was a print and given to pt.   Called med to [facility] as listed in chart.

## 2018-04-07 ENCOUNTER — Ambulatory Visit: Payer: Self-pay | Admitting: Emergency Medicine

## 2018-04-07 NOTE — Telephone Encounter (Signed)
I returned the call and Christopher [name] the wife answered the phone.   She was not with him.   "I'm at work".   "He is at home".   "He stayed home due to the continued back pain".   I could not locate her name on the DPR.    "He should have signed that".   "I work at a [place] office also so I know what you are talking about".   I let her know I could not locate her name except as an emergency contact.  He saw Dr. Nyoka Cowden on 03/24/18 for back pain.   He finished the Prednisone but is still having the back pain per wife.   When I asked if I could call and speak with him she said he is very hard of hearing and has difficulty communicating over the phone.    Since this is the same issue he has been seen for I let her know when he calls back he does not need to talk with a nurse.   She is requesting he see another doctor preferably that speaks [place].   He saw someone there before but she can't remember who it was.   I listed the names for her but she could not remember who he saw.    She asked if he could call in and make the appt because he would know who he saw.   I let her know that would be preferable.   I gave her the office number for her to give to her husband.  She will have him call us and schedule the appt.  Christopher [name] was agreeable to this plan.

## 2018-06-06 ENCOUNTER — Ambulatory Visit: Payer: 59 | Admitting: Cardiology

## 2018-06-06 ENCOUNTER — Other Ambulatory Visit: Payer: 59

## 2018-06-06 ENCOUNTER — Encounter: Payer: Self-pay | Admitting: Cardiology

## 2018-06-06 VITALS — BP 122/68 | HR 61 | Ht 70.0 in | Wt 233.6 lb

## 2018-06-06 DIAGNOSIS — E785 Hyperlipidemia, unspecified: Secondary | ICD-10-CM | POA: Diagnosis not present

## 2018-06-06 DIAGNOSIS — I493 Ventricular premature depolarization: Secondary | ICD-10-CM | POA: Diagnosis not present

## 2018-06-06 DIAGNOSIS — Z8249 Family history of ischemic heart disease and other diseases of the circulatory system: Secondary | ICD-10-CM

## 2018-06-06 DIAGNOSIS — R002 Palpitations: Secondary | ICD-10-CM

## 2018-06-06 DIAGNOSIS — R072 Precordial pain: Secondary | ICD-10-CM

## 2018-06-06 LAB — BASIC METABOLIC PANEL
BUN/Creatinine Ratio: 15 (ref 9–20)
BUN: 18 mg/dL (ref 6–24)
CO2: 22 mmol/L (ref 20–29)
Calcium: 9.7 mg/dL (ref 8.7–10.2)
Chloride: 103 mmol/L (ref 96–106)
Creatinine, Ser: 1.18 mg/dL (ref 0.76–1.27)
GFR calc Af Amer: 79 mL/min/{1.73_m2} (ref >59)
GFR calc non Af Amer: 69 mL/min/{1.73_m2} (ref >59)
Glucose: 87 mg/dL (ref 65–99)
Potassium: 4.4 mmol/L (ref 3.5–5.2)
Sodium: 140 mmol/L (ref 134–144)

## 2018-06-06 LAB — LIPID PANEL
Chol/HDL Ratio: 4.1 ratio (ref 0.0–5.0)
Cholesterol, Total: 176 mg/dL (ref 100–199)
HDL: 43 mg/dL (ref >39)
LDL Calculated: 120 mg/dL — ABNORMAL HIGH (ref 0–99)
Triglycerides: 67 mg/dL (ref 0–149)
VLDL Cholesterol Cal: 13 mg/dL (ref 5–40)

## 2018-06-06 NOTE — Progress Notes (Signed)
Patient ID: VUE PAVON [name], male   DOB: [birth year], 57 y.o.   MRN: [id number]    Patient Name: Christopher [name] Date of Encounter: 06/06/2018  Primary Care Provider:  Horald Pollen, MD Primary Cardiologist: Ena Dawley  Problem List   Past Medical History:  Diagnosis Date  . GERD (gastroesophageal reflux disease) 10/31/2011  . Hearing impaired 12/17/2013   lifelong   . Hypogonadism male 10/31/2011   Past Surgical History:  Procedure Laterality Date  . TONSILLECTOMY     Allergies  Allergies  Allergen Reactions  . Asa Arthritis Strength-Antacid [Aspirin Buffered] Swelling  . Penicillins Hives  . Sulfa Antibiotics Hives   HPI  Pleasant 57 year old-year-old gentleman originally from [place] who is coming with concerns of palpitations and shortness of breath. The patient is generally very healthy and has no prior medical history not significant family history of premature coronary artery disease. 3 of his cousins at his age has known coronary artery disease 2 of them underwent coronary artery bypass surgery and one of them passed away from heart attack. The patient exercises by lifting weights with no significant symptoms of shortness of breath or chest pain.  He continues to experience palpitations at rest that are associated with slight dizziness and chest pain and shortness of breath.  His cholesterol has been elevated and he has tried to lose weight.  He has not been treated with statins yet.  Denies any lower extremity edema or claudications.  Home Medications  Prior to Admission medications   Not on File   Family History  Family History  Problem Relation Age of Onset  . Cancer Father   . Hyperlipidemia Father   . Heart disease Mother   . Hyperlipidemia Mother    Social History  Social History   Socioeconomic History  . Marital status: Married    Spouse name: Not on file  . Number of children: 2  . Years of education: Not on file  .  Highest education level: Not on file  Occupational History  . Occupation: Chemical engineer: [employer]  . Financial resource strain: Not on file  . Food insecurity:    Worry: Not on file    Inability: Not on file  . Transportation needs:    Medical: Not on file    Non-medical: Not on file  Tobacco Use  . Smoking status: Never Smoker  . Smokeless tobacco: Never Used  Substance and Sexual Activity  . Alcohol use: No  . Drug use: No  . Sexual activity: Yes  Lifestyle  . Physical activity:    Days per week: Not on file    Minutes per session: Not on file  . Stress: Not on file  Relationships  . Social connections:    Talks on phone: Not on file    Gets together: Not on file    Attends religious service: Not on file    Active member of club or organization: Not on file    Attends meetings of clubs or organizations: Not on file    Relationship status: Not on file  . Intimate partner violence:    Fear of current or ex partner: Not on file    Emotionally abused: Not on file    Physically abused: Not on file    Forced sexual activity: Not on file  Other Topics Concern  . Not on file  Social History Narrative   Married.  Education: Grade School.  Review of Systems, as per HPI, otherwise negative General:  No chills, fever, night sweats or weight changes.  Cardiovascular:  No chest pain, dyspnea on exertion, edema, orthopnea, palpitations, paroxysmal nocturnal dyspnea. Dermatological: No rash, lesions/masses Respiratory: No cough, dyspnea Urologic: No hematuria, dysuria Abdominal:   No nausea, vomiting, diarrhea, bright red blood per rectum, melena, or hematemesis Neurologic:  No visual changes, wkns, changes in mental status. All other systems reviewed and are otherwise negative except as noted above.  Physical Exam  Blood pressure 122/68, pulse 61, height 5\' 10"  (1.778 m), weight 233 lb 9.6 oz (106 kg), SpO2 98 %.  General: Pleasant,  NAD Psych: Normal affect. Neuro: Alert and oriented X 3. Moves all extremities spontaneously. HEENT: Normal  Neck: Supple without bruits or JVD. Lungs:  Resp regular and unlabored, CTA. Heart: RRR no s3, s4, or murmurs. Abdomen: Soft, non-tender, non-distended, BS + x 4.  Extremities: No clubbing, cyanosis or edema. DP/PT/Radials 2+ and equal bilaterally.  Labs:  No results for input(s): CKTOTAL, CKMB, TROPONINI in the last 72 hours. Lab Results  Component Value Date   WBC 5.1 02/07/2018   HGB 14.2 02/07/2018   HCT 42.5 02/07/2018   MCV 86 02/07/2018   PLT 244 02/07/2018    No results found for: DDIMER Invalid input(s): POCBNP    Component Value Date/Time   NA 142 02/07/2018 1101   K 4.4 02/07/2018 1101   CL 106 02/07/2018 1101   CO2 21 02/07/2018 1101   GLUCOSE 99 02/07/2018 1101   GLUCOSE 105 (H) 12/27/2015 0859   BUN 14 02/07/2018 1101   CREATININE 1.14 02/07/2018 1101   CREATININE 1.06 12/27/2015 0859   CALCIUM 9.3 02/07/2018 1101   PROT 7.3 02/07/2018 1101   ALBUMIN 4.3 02/07/2018 1101   AST 22 02/07/2018 1101   ALT 20 02/07/2018 1101   ALKPHOS 78 02/07/2018 1101   BILITOT 0.8 02/07/2018 1101   GFRNONAA 71 02/07/2018 1101   GFRNONAA 79 12/27/2015 0859   GFRAA 83 02/07/2018 1101   GFRAA >89 12/27/2015 0859   Lab Results  Component Value Date   CHOL 200 (H) 02/07/2018   HDL 48 02/07/2018   LDLCALC 135 (H) 02/07/2018   TRIG 87 02/07/2018   ETT: 02/2014 ETT Interpretation:  normal - no evidence of ischemia by ST analysis Comments: Patient presents today for routine GXT. Has strong FH for CAD - he is obese. Has had some dyspnea and palpitations.   Today the patient exercised on the standard Bruce protocol for a total of 9 minutes.  Good exercise tolerance.  Adequate blood pressure response.  Clinically negative for chest pain. Test was stopped due to achievement of target HR.  EKG negative for ischemia. No significant arrhythmia noted.   48-Holter  monitor  Rare PVCs, infrequent PACs.  No arrhythmias or significant pauses identified. Normal Holter monitor, no therapy necessary.  Accessory Clinical Findings  Echocardiogram - 2009,Overall left ventricular systolic function was normal. Left ventricular ejection fraction was estimated to be 60 %. There were no left ventricular regional wall motion abnormalities. - Estimated peak pulmonary artery systolic pressure 24 mmHg.  TTE: 01/25/2016 - Left ventricle: The cavity size was normal. Wall thickness was  normal. Systolic function was normal. The estimated ejection  fraction was in the range of 60% to 65%. Wall motion was normal;  there were no regional wall motion abnormalities. Left  ventricular diastolic function parameters were normal. - Mitral valve: Mildly thickened leaflets . There was trivial  regurgitation. -  Left atrium: The atrium was normal in size. - Right atrium: The atrium was mildly dilated. - Inferior vena cava: The vessel was normal in size. The  respirophasic diameter changes were in the normal range (>= 50%),  consistent with normal central venous pressure.  Impressions: - LVEF 60-65%, normal wall thickness, normal wall motion, normal  diastolic function, normal LA size, mildly dilated RA, no  significant TR, normal IVC.  ECG - sinus rhythm, normal EKG.   Assessment & Plan  1.  Chest pain -the pain is rather atypical however he has very significant family history of premature CAD and elevated LDL, will obtain coronary CTA to evaluate for obstructive CAD as well as guidance for lipid management.  2.  Palpitations - underwent 48 hour Holter monitor during which he had an episode of presyncope showed only a few pieces PVCs otherwise no arrhythmias or pauses.  3. Hyperlipidemia - LDL 146-->135, he has lost some weight, we will repeat lipids today as well as obtain coronary CTA and decide on management.  We will most certainly start statins if LDL  still elevated.  Follow-up in 1 year.  Ena Dawley, MD, [facility] 06/06/2018, 8:28 AM

## 2018-06-06 NOTE — Patient Instructions (Signed)
Medication Instructions:   Your physician recommends that you continue on your current medications as directed. Please refer to the Current Medication list given to you today.   Labwork:  TODAY-BMET AND LIPIDS     Testing/Procedures:  CORONARY CT Please arrive at the [facility] main entrance of [facility] at xx:xx AM (30-45 minutes prior to test start time)  [facility] [place], [place] [address] [phone]  Proceed to the [facility] Radiology Department (First Floor).  Please follow these instructions carefully (unless otherwise directed):   On the Night Before the Test: . Drink plenty of water. . Do not consume any caffeinated/decaffeinated beverages or chocolate 12 hours prior to your test. . Do not take any antihistamines 12 hours prior to your test.   On the Day of the Test: . Drink plenty of water. Do not drink any water within one hour of the test. . Do not eat any food 4 hours prior to the test. . You may take your regular medications prior to the test.   After the Test: . Drink plenty of water. . After receiving IV contrast, you may experience a mild flushed feeling. This is normal. . On occasion, you may experience a mild rash up to 24 hours after the test. This is not dangerous. If this occurs, you can take Benadryl 25 mg and increase your fluid intake. . If you experience trouble breathing, this can be serious. If it is severe call 911 IMMEDIATELY. If it is mild, please call our office.      Follow-Up:  Your physician wants you to follow-up in: [place] will receive a reminder letter in the mail two months in advance. If you don't receive a letter, please call our office to schedule the follow-up appointment.        If you need a refill on your cardiac medications before your next appointment, please call your pharmacy.

## 2018-07-16 ENCOUNTER — Ambulatory Visit (HOSPITAL_COMMUNITY): Admission: RE | Admit: 2018-07-16 | Payer: 59 | Source: Ambulatory Visit

## 2018-07-16 ENCOUNTER — Encounter (HOSPITAL_COMMUNITY): Payer: Self-pay

## 2018-07-16 ENCOUNTER — Ambulatory Visit (HOSPITAL_COMMUNITY)
Admission: RE | Admit: 2018-07-16 | Discharge: 2018-07-16 | Disposition: A | Discharge status: Home / Self Care | Payer: 59 | Source: Ambulatory Visit | Attending: Cardiology | Admitting: Cardiology

## 2018-07-16 DIAGNOSIS — R079 Chest pain, unspecified: Secondary | ICD-10-CM | POA: Diagnosis not present

## 2018-07-16 DIAGNOSIS — R072 Precordial pain: Secondary | ICD-10-CM | POA: Diagnosis not present

## 2018-07-16 DIAGNOSIS — E785 Hyperlipidemia, unspecified: Secondary | ICD-10-CM | POA: Insufficient documentation

## 2018-07-16 DIAGNOSIS — Z8249 Family history of ischemic heart disease and other diseases of the circulatory system: Secondary | ICD-10-CM | POA: Diagnosis not present

## 2018-07-16 DIAGNOSIS — I493 Ventricular premature depolarization: Secondary | ICD-10-CM | POA: Insufficient documentation

## 2018-07-16 DIAGNOSIS — I288 Other diseases of pulmonary vessels: Secondary | ICD-10-CM | POA: Diagnosis not present

## 2018-07-16 MED ORDER — NITROGLYCERIN 0.4 MG SL SUBL
SUBLINGUAL_TABLET | SUBLINGUAL | Status: AC
  Filled 2018-07-16: qty 2

## 2018-07-16 MED ORDER — NITROGLYCERIN 0.4 MG SL SUBL
0.8000 mg | SUBLINGUAL_TABLET | Freq: Once | SUBLINGUAL | Status: AC
  Administered 2018-07-16: 0.8 mg via SUBLINGUAL
  Filled 2018-07-16: qty 25

## 2018-07-16 MED ORDER — IOPAMIDOL (ISOVUE-370) INJECTION 76%
100.0000 mL | Freq: Once | INTRAVENOUS | Status: AC | PRN
  Administered 2018-07-16: 80 mL via INTRAVENOUS

## 2018-07-16 MED ORDER — IOPAMIDOL (ISOVUE-370) INJECTION 76%
INTRAVENOUS | Status: AC
  Filled 2018-07-16: qty 100

## 2018-08-05 ENCOUNTER — Ambulatory Visit (INDEPENDENT_AMBULATORY_CARE_PROVIDER_SITE_OTHER): Payer: 59 | Admitting: Emergency Medicine

## 2018-08-05 DIAGNOSIS — Z23 Encounter for immunization: Secondary | ICD-10-CM | POA: Diagnosis not present

## 2018-08-27 ENCOUNTER — Ambulatory Visit: Payer: 59 | Admitting: Family Medicine

## 2018-08-28 ENCOUNTER — Ambulatory Visit: Payer: 59 | Admitting: Physician Assistant

## 2018-10-22 ENCOUNTER — Encounter: Payer: Self-pay | Admitting: Emergency Medicine

## 2018-10-22 ENCOUNTER — Ambulatory Visit: Payer: 59 | Admitting: Emergency Medicine

## 2018-10-22 ENCOUNTER — Other Ambulatory Visit: Payer: Self-pay

## 2018-10-22 VITALS — BP 144/68 | HR 74 | Temp 99.6°F | Resp 16 | Ht 70.0 in | Wt 255.4 lb

## 2018-10-22 DIAGNOSIS — J111 Influenza due to unidentified influenza virus with other respiratory manifestations: Secondary | ICD-10-CM

## 2018-10-22 DIAGNOSIS — R52 Pain, unspecified: Secondary | ICD-10-CM | POA: Diagnosis not present

## 2018-10-22 LAB — POCT INFLUENZA A/B
INFLUENZA A, POC: POSITIVE — AB
INFLUENZA B, POC: NEGATIVE

## 2018-10-22 MED ORDER — OSELTAMIVIR PHOSPHATE 75 MG PO CAPS: 75.0000 mg | ORAL_CAPSULE | Freq: Two times a day (BID) | ORAL | 0 refills | Status: AC

## 2018-10-22 NOTE — Patient Instructions (Addendum)
   If you have lab work done today you will be contacted with your lab results within the next 2 weeks.  If you have not heard from us then please contact us. The fastest way to get your results is to register for My Chart.   IF you received an x-ray today, you will receive an invoice from [facility]. Please contact [facility] at [phone] with questions or concerns regarding your invoice.   IF you received labwork today, you will receive an invoice from [facility]. Please contact [facility] at [phone] with questions or concerns regarding your invoice.   Our billing staff will not be able to assist you with questions regarding bills from these companies.  You will be contacted with the lab results as soon as they are available. The fastest way to get your results is to activate your My Chart account. Instructions are located on the last page of this paperwork. If you have not heard from us regarding the results in 2 weeks, please contact this office.     Influenza, Adult Influenza is also called "the flu." It is an infection in the lungs, nose, and throat (respiratory tract). It is caused by a virus. The flu causes symptoms that are similar to symptoms of a cold. It also causes a high fever and body aches. The flu spreads easily from person to person (is contagious). Getting a flu shot (influenza vaccination) every year is the best way to prevent the flu. What are the causes? This condition is caused by the influenza virus. You can get the virus by:  Breathing in droplets that are in the air from the cough or sneeze of a person who has the virus.  Touching something that has the virus on it (is contaminated) and then touching your mouth, nose, or eyes. What increases the risk? Certain things may make you more likely to get the flu. These include:  Not washing your hands often.  Having close contact with many people during cold and flu season.  Touching your  mouth, eyes, or nose without first washing your hands.  Not getting a flu shot every year. You may have a higher risk for the flu, along with serious problems such as a lung infection (pneumonia), if you:  Are older than 65.  Are pregnant.  Have a weakened disease-fighting system (immune system) because of a disease or taking certain medicines.  Have a long-term (chronic) illness, such as: ? Heart, kidney, or lung disease. ? Diabetes. ? Asthma.  Have a liver disorder.  Are very overweight (morbidly obese).  Have anemia. This is a condition that affects your red blood cells. What are the signs or symptoms? Symptoms usually begin suddenly and last 4-14 days. They may include:  Fever and chills.  Headaches, body aches, or muscle aches.  Sore throat.  Cough.  Runny or stuffy (congested) nose.  Chest discomfort.  Not wanting to eat as much as normal (poor appetite).  Weakness or feeling tired (fatigue).  Dizziness.  Feeling sick to your stomach (nauseous) or throwing up (vomiting). How is this treated? If the flu is found early, you can be treated with medicine that can help reduce how bad the illness is and how long it lasts (antiviral medicine). This may be given by mouth (orally) or through an IV tube. Taking care of yourself at home can help your symptoms get better. Your doctor may suggest:  Taking over-the-counter medicines.  Drinking plenty of fluids. The flu often   goes away on its own. If you have very bad symptoms or other problems, you may be treated in a hospital. Follow these instructions at home:     Activity  Rest as needed. Get plenty of sleep.  Stay home from work or school as told by your doctor. ? Do not leave home until you do not have a fever for 24 hours without taking medicine. ? Leave home only to visit your doctor. Eating and drinking  Take an ORS (oral rehydration solution). This is a drink that is sold at pharmacies and  stores.  Drink enough fluid to keep your pee (urine) pale yellow.  Drink clear fluids in small amounts as you are able. Clear fluids include: ? Water. ? Ice chips. ? Fruit juice that has water added (diluted fruit juice). ? Low-calorie sports drinks.  Eat bland, easy-to-digest foods in small amounts as you are able. These foods include: ? Bananas. ? Applesauce. ? Rice. ? Lean meats. ? Toast. ? Crackers.  Do not eat or drink: ? Fluids that have a lot of sugar or caffeine. ? Alcohol. ? Spicy or fatty foods. General instructions  Take over-the-counter and prescription medicines only as told by your doctor.  Use a cool mist humidifier to add moisture to the air in your home. This can make it easier for you to breathe.  Cover your mouth and nose when you cough or sneeze.  Wash your hands with soap and water often, especially after you cough or sneeze. If you cannot use soap and water, use alcohol-based hand sanitizer.  Keep all follow-up visits as told by your doctor. This is important. How is this prevented?   Get a flu shot every year. You may get the flu shot in late summer, fall, or winter. Ask your doctor when you should get your flu shot.  Avoid contact with people who are sick during fall and winter (cold and flu season). Contact a doctor if:  You get new symptoms.  You have: ? Chest pain. ? Watery poop (diarrhea). ? A fever.  Your cough gets worse.  You start to have more mucus.  You feel sick to your stomach.  You throw up. Get help right away if you:  Have shortness of breath.  Have trouble breathing.  Have skin or nails that turn a bluish color.  Have very bad pain or stiffness in your neck.  Get a sudden headache.  Get sudden pain in your face or ear.  Cannot eat or drink without throwing up. Summary  Influenza ("the flu") is an infection in the lungs, nose, and throat. It is caused by a virus.  Take over-the-counter and prescription  medicines only as told by your doctor.  Getting a flu shot every year is the best way to avoid getting the flu. This information is not intended to replace advice given to you by your health care provider. Make sure you discuss any questions you have with your health care provider. Document Released: 06/12/2008 Document Revised: 02/19/2018 Document Reviewed: 02/19/2018 [employer] Interactive Patient Education  2019 [employer]  

## 2018-10-22 NOTE — Progress Notes (Signed)
Christopher [name] Colon 58 y.o.   Chief Complaint  Patient presents with  . Generalized Body Aches    started x 4 days ago  . Cough    productive with yellow mucus     HISTORY OF PRESENT ILLNESS: This is a 58 y.o. male complaining of flulike symptoms that started 4 days ago.  Not getting better.  Influenza  This is a new problem. The current episode started in the past 7 days. The problem occurs constantly. The problem has been gradually worsening. Associated symptoms include chills, congestion, coughing, fatigue, a fever, headaches, myalgias and weakness. Pertinent negatives include no abdominal pain, anorexia, arthralgias, change in bowel habit, chest pain, nausea, rash, sore throat, swollen glands, urinary symptoms, vertigo, visual change or vomiting. He has tried nothing for the symptoms.     Prior to Admission medications   Medication Sig Start Date End Date Taking? Authorizing Provider  Acetaminophen (TYLENOL PO) Take by mouth as needed.   Yes [provider]  Phenylephrine HCl (AFRIN ALLERGY NA) Place into the nose as needed.   Yes [provider]    Allergies  Allergen Reactions  . Asa Arthritis Strength-Antacid [Aspirin Buffered] Swelling  . Penicillins Hives  . Sulfa Antibiotics Hives    Patient Active Problem List   Diagnosis Date Noted  . Family history of premature CAD 01/13/2016  . Hyperlipidemia 01/13/2016  . Low testosterone in male 05/07/2014  . Hearing impaired 12/17/2013    Past Medical History:  Diagnosis Date  . GERD (gastroesophageal reflux disease) 10/31/2011  . Hearing impaired 12/17/2013   lifelong   . 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 TONSILLECTOMY      Social History   Socioeconomic History  . Marital status: Married    Spouse name: Not on file  . Number of children: 2  . Years of education: Not on file  . Highest education level: Not on file  Occupational History    . Occupation: Chemical engineer: [employer]  . Financial resource strain: Not on file  . Food insecurity:    Worry: Not on file    Inability: Not on file  . Transportation needs:    Medical: Not on file    Non-medical: Not on file  Tobacco Use  . Smoking status: Never Smoker  . Smokeless tobacco: Never Used  Substance and Sexual Activity  . Alcohol use: No  . Drug use: No  . Sexual activity: Yes  Lifestyle  . Physical activity:    Days per week: Not on file    Minutes per session: Not on file  . Stress: Not on file  Relationships  . Social connections:    Talks on phone: Not on file    Gets together: Not on file    Attends religious service: Not on file    Active member of club or organization: Not on file    Attends meetings of clubs or organizations: Not on file    Relationship status: Not on file  . Intimate partner violence:    Fear of current or ex partner: Not on file    Emotionally abused: Not on file    Physically abused: Not on file    Forced sexual activity: Not on file  Other Topics Concern  . Not on file  Social History Narrative   Married.  Education: Grade School.    Family History  Problem  Relation Age of Onset  . Cancer Father   . Hyperlipidemia Father   . Heart disease Mother   . Hyperlipidemia Mother      Review of Systems  Constitutional: Positive for chills, fatigue and fever.  HENT: Positive for congestion. Negative for sore throat.   Eyes: Negative for discharge and redness.  Respiratory: Positive for cough. Negative for hemoptysis, shortness of breath and wheezing.   Cardiovascular: Negative.  Negative for chest pain, palpitations and leg swelling.  Gastrointestinal: Negative.  Negative for abdominal pain, anorexia, change in bowel habit, nausea and vomiting.  Genitourinary: Negative.  Negative for dysuria.  Musculoskeletal: Positive for myalgias. Negative for arthralgias.  Skin: Negative for rash.   Neurological: Positive for weakness and headaches. Negative for vertigo.  All other systems reviewed and are negative.  Vitals:   10/22/18 0936  BP: (!) 144/68  Pulse: 74  Resp: 16  Temp: 99.6 F (37.6 C)  SpO2: 98%     Physical Exam Vitals signs reviewed.  Constitutional:      Appearance: Normal appearance.  HENT:     Head: Normocephalic and atraumatic.     Nose: Nose normal.     Mouth/Throat:     Mouth: Mucous membranes are moist.     Pharynx: Oropharynx is clear.  Eyes:     Extraocular Movements: Extraocular movements intact.     Conjunctiva/sclera: Conjunctivae normal.     Pupils: Pupils are equal, round, and reactive to light.  Cardiovascular:     Rate and Rhythm: Normal rate and regular rhythm.     Heart sounds: Normal heart sounds.  Pulmonary:     Effort: Pulmonary effort is normal.     Breath sounds: Normal breath sounds.  Abdominal:     General: There is no distension.     Palpations: Abdomen is soft.     Tenderness: There is no abdominal tenderness.  Musculoskeletal: Normal range of motion.  Skin:    General: Skin is warm and dry.     Capillary Refill: Capillary refill takes less than 2 seconds.  Neurological:     General: No focal deficit present.     Mental Status: He is alert and oriented to person, place, and time.  Psychiatric:        Mood and Affect: Mood normal.        Behavior: Behavior normal.    Results for orders placed or performed in visit on 10/22/18 (from the past 24 hour(s))  POCT Influenza A/B     Status: Abnormal   Collection Time: 10/22/18 10:05 AM  Result Value Ref Range   Influenza A, POC Positive (A) Negative   Influenza B, POC Negative Negative     ASSESSMENT & PLAN: Christopher [name] was seen today for generalized body aches and cough.  Diagnoses and all orders for this visit:  Influenza -     oseltamivir (TAMIFLU) 75 MG capsule; Take 1 capsule (75 mg total) by mouth 2 (two) times daily for 5 days.  Generalized body aches -      POCT Influenza A/B    Patient Instructions       If you have lab work done today you will be contacted with your lab results within the next 2 weeks.  If you have not heard from [place] then please contact us. The fastest way to get your results is to register for My Chart.   IF you received an x-ray today, you will receive an invoice from [facility]. Please contact [facility]  Radiology at [phone] with questions or concerns regarding your invoice.   IF you received labwork today, you will receive an invoice from [facility]. Please contact [facility] at [phone] with questions or concerns regarding your invoice.   Our billing staff will not be able to assist you with questions regarding bills from these companies.  You will be contacted with the lab results as soon as they are available. The fastest way to get your results is to activate your My Chart account. Instructions are located on the last page of this paperwork. If you have not heard from [place] regarding the results in 2 weeks, please contact this office.     Influenza, Adult Influenza is also called "the flu." It is an infection in the lungs, nose, and throat (respiratory tract). It is caused by a virus. The flu causes symptoms that are similar to symptoms of a cold. It also causes a high fever and body aches. The flu spreads easily from person to person (is contagious). Getting a flu shot (influenza vaccination) every year is the best way to prevent the flu. What are the causes? This condition is caused by the influenza virus. You can get the virus by:  Breathing in droplets that are in the air from the cough or sneeze of a person who has the virus.  Touching something that has the virus on it (is contaminated) and then touching your mouth, nose, or eyes. What increases the risk? Certain things may make you more likely to get the flu. These include:  Not washing your hands often.  Having close contact with  many people during cold and flu season.  Touching your mouth, eyes, or nose without first washing your hands.  Not getting a flu shot every year. You may have a higher risk for the flu, along with serious problems such as a lung infection (pneumonia), if you:  Are older than 65.  Are pregnant.  Have a weakened disease-fighting system (immune system) because of a disease or taking certain medicines.  Have a long-term (chronic) illness, such as: ? Heart, kidney, or lung disease. ? Diabetes. ? Asthma.  Have a liver disorder.  Are very overweight (morbidly obese).  Have anemia. This is a condition that affects your red blood cells. What are the signs or symptoms? Symptoms usually begin suddenly and last 4-14 days. They may include:  Fever and chills.  Headaches, body aches, or muscle aches.  Sore throat.  Cough.  Runny or stuffy (congested) nose.  Chest discomfort.  Not wanting to eat as much as normal (poor appetite).  Weakness or feeling tired (fatigue).  Dizziness.  Feeling sick to your stomach (nauseous) or throwing up (vomiting). How is this treated? If the flu is found early, you can be treated with medicine that can help reduce how bad the illness is and how long it lasts (antiviral medicine). This may be given by mouth (orally) or through an IV tube. Taking care of yourself at home can help your symptoms get better. Your doctor may suggest:  Taking over-the-counter medicines.  Drinking plenty of fluids. The flu often goes away on its own. If you have very bad symptoms or other problems, you may be treated in a hospital. Follow these instructions at home:     Activity  Rest as needed. Get plenty of sleep.  Stay home from work or school as told by your doctor. ? Do not leave home until you do not have a fever for 24 hours without  taking medicine. ? Leave home only to visit your doctor. Eating and drinking  Take an ORS (oral rehydration solution).  This is a drink that is sold at pharmacies and stores.  Drink enough fluid to keep your pee (urine) pale yellow.  Drink clear fluids in small amounts as you are able. Clear fluids include: ? Water. ? Ice chips. ? Fruit juice that has water added (diluted fruit juice). ? Low-calorie sports drinks.  Eat bland, easy-to-digest foods in small amounts as you are able. These foods include: ? Bananas. ? Applesauce. ? Rice. ? Lean meats. ? Toast. ? Crackers.  Do not eat or drink: ? Fluids that have a lot of sugar or caffeine. ? Alcohol. ? Spicy or fatty foods. General instructions  Take over-the-counter and prescription medicines only as told by your doctor.  Use a cool mist humidifier to add moisture to the air in your home. This can make it easier for you to breathe.  Cover your mouth and nose when you cough or sneeze.  Wash your hands with soap and water often, especially after you cough or sneeze. If you cannot use soap and water, use alcohol-based hand sanitizer.  Keep all follow-up visits as told by your doctor. This is important. How is this prevented?   Get a flu shot every year. You may get the flu shot in late summer, fall, or winter. Ask your doctor when you should get your flu shot.  Avoid contact with people who are sick during fall and winter (cold and flu season). Contact a doctor if:  You get new symptoms.  You have: ? Chest pain. ? Watery poop (diarrhea). ? A fever.  Your cough gets worse.  You start to have more mucus.  You feel sick to your stomach.  You throw up. Get help right away if you:  Have shortness of breath.  Have trouble breathing.  Have skin or nails that turn a bluish color.  Have very bad pain or stiffness in your neck.  Get a sudden headache.  Get sudden pain in your face or ear.  Cannot eat or drink without throwing up. Summary  Influenza ("the flu") is an infection in the lungs, nose, and throat. It is caused by a  virus.  Take over-the-counter and prescription medicines only as told by your doctor.  Getting a flu shot every year is the best way to avoid getting the flu. This information is not intended to replace advice given to you by your health care provider. Make sure you discuss any questions you have with your health care provider. Document Released: 06/12/2008 Document Revised: 02/19/2018 Document Reviewed: 02/19/2018 [employer]vier Interactive Patient Education  2019 [employer]      Agustina Caroli, MD [facility]

## 2018-11-12 ENCOUNTER — Ambulatory Visit: Payer: 59 | Admitting: Family Medicine

## 2018-11-13 ENCOUNTER — Ambulatory Visit: Payer: 59 | Admitting: Emergency Medicine

## 2019-03-23 ENCOUNTER — Encounter: Payer: Self-pay | Admitting: Emergency Medicine

## 2019-03-23 ENCOUNTER — Ambulatory Visit (INDEPENDENT_AMBULATORY_CARE_PROVIDER_SITE_OTHER): Payer: 59 | Admitting: Emergency Medicine

## 2019-03-23 ENCOUNTER — Other Ambulatory Visit: Payer: Self-pay

## 2019-03-23 DIAGNOSIS — Z125 Encounter for screening for malignant neoplasm of prostate: Secondary | ICD-10-CM

## 2019-03-23 DIAGNOSIS — Z0000 Encounter for general adult medical examination without abnormal findings: Secondary | ICD-10-CM | POA: Diagnosis not present

## 2019-03-23 DIAGNOSIS — Z1329 Encounter for screening for other suspected endocrine disorder: Secondary | ICD-10-CM

## 2019-03-23 DIAGNOSIS — Z13228 Encounter for screening for other metabolic disorders: Secondary | ICD-10-CM

## 2019-03-23 DIAGNOSIS — Z130 Encounter for screening for diseases of the blood and blood-forming organs and certain disorders involving the immune mechanism: Secondary | ICD-10-CM | POA: Diagnosis not present

## 2019-03-23 DIAGNOSIS — Z8639 Personal history of other endocrine, nutritional and metabolic disease: Secondary | ICD-10-CM

## 2019-03-23 DIAGNOSIS — Z8679 Personal history of other diseases of the circulatory system: Secondary | ICD-10-CM

## 2019-03-23 DIAGNOSIS — Z13220 Encounter for screening for lipoid disorders: Secondary | ICD-10-CM | POA: Diagnosis not present

## 2019-03-23 NOTE — Progress Notes (Signed)
BP Readings from Last 3 Encounters:  03/23/19 102/68  10/22/18 (!) 144/68  07/16/18 (!) 106/56   Christopher [name] 58 y.o.   Chief Complaint  Patient presents with  . Annual Exam    HISTORY OF PRESENT ILLNESS: This is a 58 y.o. male here for his annual exam.  Has no complaints or medical concerns today. Healthy man with a healthy lifestyle. Has a history of PVCs.  On no medication.  Previous cardiac work-up within normal limits. Health maintenance reviewed.  Up-to-date. Exercises regularly.  Sleeps well.  Good nutrition.  HPI   Prior to Admission medications   Medication Sig Start Date End Date Taking? Authorizing Provider  Acetaminophen (TYLENOL PO) Take by mouth as needed.    [provider]  Phenylephrine HCl (AFRIN ALLERGY NA) Place into the nose as needed.    [provider]    Allergies  Allergen Reactions  . Asa Arthritis Strength-Antacid [Aspirin Buffered] Swelling  . Penicillins Hives  . Sulfa Antibiotics Hives    Patient Active Problem List   Diagnosis Date Noted  . Family history of premature CAD 01/13/2016  . Hyperlipidemia 01/13/2016  . Low testosterone in male 05/07/2014  . Hearing impaired 12/17/2013    Past Medical History:  Diagnosis Date  . GERD (gastroesophageal reflux disease) 10/31/2011  . Hearing impaired 12/17/2013   lifelong   . 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 TONSILLECTOMY      Social History   Socioeconomic History  . Marital status: Married    Spouse name: Not on file  . Number of children: 2  . Years of education: Not on file  . Highest education level: Not on file  Occupational History  . Occupation: Chemical engineer: [employer]  . Financial resource strain: Not on file  . Food insecurity    Worry: Not on file    Inability: Not on file  . Transportation needs    Medical: Not on file    Non-medical: Not on  file  Tobacco Use  . Smoking status: Never Smoker  . Smokeless tobacco: Never Used  Substance and Sexual Activity  . Alcohol use: No  . Drug use: No  . Sexual activity: Yes  Lifestyle  . Physical activity    Days per week: Not on file    Minutes per session: Not on file  . Stress: Not on file  Relationships  . Social Herbalist on phone: Not on file    Gets together: Not on file    Attends religious service: Not on file    Active member of club or organization: Not on file    Attends meetings of clubs or organizations: Not on file    Relationship status: Not on file  . Intimate partner violence    Fear of current or ex partner: Not on file    Emotionally abused: Not on file    Physically abused: Not on file    Forced sexual activity: Not on file  Other Topics Concern  . Not on file  Social History Narrative   Married.  Education: Grade School.    Family History  Problem Relation Age of Onset  . Cancer Father   . Hyperlipidemia Father   . Heart disease Mother   . Hyperlipidemia Mother      Review of Systems  Constitutional: Negative.  Negative for chills and fever.  HENT: Positive for hearing loss (Chronic). Negative for congestion and sore throat.   Eyes: Negative.  Negative for blurred vision and double vision.  Respiratory: Negative.  Negative for cough and shortness of breath.   Cardiovascular: Negative.  Negative for chest pain and palpitations.  Gastrointestinal: Negative.  Negative for abdominal pain, diarrhea, nausea and vomiting.  Genitourinary: Negative.  Negative for dysuria.  Musculoskeletal: Negative.  Negative for back pain, myalgias and neck pain.  Skin: Negative.  Negative for rash.  Neurological: Negative.  Negative for dizziness and headaches.  Endo/Heme/Allergies: Negative.   All other systems reviewed and are negative.  Vitals:   03/23/19 0932  BP: 102/68  Pulse: 65  Resp: 16  Temp: 98.7 F (37.1 C)  SpO2: 99%     Physical  Exam Vitals signs reviewed.  Constitutional:      Appearance: Normal appearance.  HENT:     Head: Normocephalic and atraumatic.     Nose: Nose normal.     Mouth/Throat:     Mouth: Mucous membranes are moist.     Pharynx: Oropharynx is clear.  Neck:     Musculoskeletal: Normal range of motion.  Cardiovascular:     Rate and Rhythm: Normal rate and regular rhythm.     Pulses: Normal pulses.     Heart sounds: Normal heart sounds.  Pulmonary:     Effort: Pulmonary effort is normal.     Breath sounds: Normal breath sounds.  Abdominal:     General: Bowel sounds are normal. There is no distension.     Palpations: Abdomen is soft.     Tenderness: There is no abdominal tenderness.  Musculoskeletal: Normal range of motion.  Skin:    General: Skin is warm and dry.     Capillary Refill: Capillary refill takes less than 2 seconds.  Neurological:     General: No focal deficit present.     Mental Status: He is alert and oriented to person, place, and time.     Sensory: No sensory deficit.     Motor: No weakness.     Coordination: Coordination normal.  Psychiatric:        Mood and Affect: Mood normal.        Behavior: Behavior normal.      ASSESSMENT & PLAN: Christopher [name] was seen today for annual exam.  Diagnoses and all orders for this visit:  Routine general medical examination at a health care facility  History of cardiac arrhythmia -     EKG 12-Lead  Screening for deficiency anemia -     CBC with Differential/Platelet  Screening for lipoid disorders -     Lipid panel  Screening for endocrine, metabolic and immunity disorder -     COMPLETE METABOLIC PANEL WITH GFR  Prostate cancer screening -     PSA  History of hypogonadism -     TestT+TestF+SHBG  EKG.  Normal sinus rhythm.  No acute ischemic changes.  Normal EKG.  Patient Instructions       If you have lab work done today you will be contacted with your lab results within the next 2 weeks.  If you have not  heard from [place] then please contact us. The fastest way to get your results is to register for My Chart.   IF you received an x-ray today, you will receive an invoice from [facility]. Please contact [facility] at [phone] with questions or concerns regarding your invoice.   IF you received labwork today, you will  receive an invoice from [facility]. Please contact [facility] at [phone] with questions or concerns regarding your invoice.   Our billing staff will not be able to assist you with questions regarding bills from these companies.  You will be contacted with the lab results as soon as they are available. The fastest way to get your results is to activate your My Chart account. Instructions are located on the last page of this paperwork. If you have not heard from [place] regarding the results in 2 weeks, please contact this office.      Health Maintenance, Male Adopting a healthy lifestyle and getting preventive care are important in promoting health and wellness. Ask your health care provider about:  The right schedule for you to have regular tests and exams.  Things you can do on your own to prevent diseases and keep yourself healthy. What should I know about diet, weight, and exercise? Eat a healthy diet   Eat a diet that includes plenty of vegetables, fruits, low-fat dairy products, and lean protein.  Do not eat a lot of foods that are high in solid fats, added sugars, or sodium. Maintain a healthy weight Body mass index (BMI) is a measurement that can be used to identify possible weight problems. It estimates body fat based on height and weight. Your health care provider can help determine your BMI and help you achieve or maintain a healthy weight. Get regular exercise Get regular exercise. This is one of the most important things you can do for your health. Most adults should:  Exercise for at least 150 minutes each week. The exercise should increase your  heart rate and make you sweat (moderate-intensity exercise).  Do strengthening exercises at least twice a week. This is in addition to the moderate-intensity exercise.  Spend less time sitting. Even light physical activity can be beneficial. Watch cholesterol and blood lipids Have your blood tested for lipids and cholesterol at 58 years of age, then have this test every 5 years. You may need to have your cholesterol levels checked more often if:  Your lipid or cholesterol levels are high.  You are older than 58 years of age.  You are at high risk for heart disease. What should I know about cancer screening? Many types of cancers can be detected early and may often be prevented. Depending on your health history and family history, you may need to have cancer screening at various ages. This may include screening for:  Colorectal cancer.  Prostate cancer.  Skin cancer.  Lung cancer. What should I know about heart disease, diabetes, and high blood pressure? Blood pressure and heart disease  High blood pressure causes heart disease and increases the risk of stroke. This is more likely to develop in people who have high blood pressure readings, are of African descent, or are overweight.  Talk with your health care provider about your target blood pressure readings.  Have your blood pressure checked: ? Every 3-5 years if you are 39-77 years of age. ? Every year if you are 41 years old or older.  If you are between the ages of 70 and 53 and are a current or former smoker, ask your health care provider if you should have a one-time screening for abdominal aortic aneurysm (AAA). Diabetes Have regular diabetes screenings. This checks your fasting blood sugar level. Have the screening done:  Once every three years after age 51 if you are at a normal weight and have a low risk for  diabetes.  More often and at a younger age if you are overweight or have a high risk for diabetes. What  should I know about preventing infection? Hepatitis B If you have a higher risk for hepatitis B, you should be screened for this virus. Talk with your health care provider to find out if you are at risk for hepatitis B infection. Hepatitis C Blood testing is recommended for:  Everyone born from [birth year] through 1965.  Anyone with known risk factors for hepatitis C. Sexually transmitted infections (STIs)  You should be screened each year for STIs, including gonorrhea and chlamydia, if: ? You are sexually active and are younger than 58 years of age. ? You are older than 58 years of age and your health care provider tells you that you are at risk for this type of infection. ? Your sexual activity has changed since you were last screened, and you are at increased risk for chlamydia or gonorrhea. Ask your health care provider if you are at risk.  Ask your health care provider about whether you are at high risk for HIV. Your health care provider may recommend a prescription medicine to help prevent HIV infection. If you choose to take medicine to prevent HIV, you should first get tested for HIV. You should then be tested every 3 months for as long as you are taking the medicine. Follow these instructions at home: Lifestyle  Do not use any products that contain nicotine or tobacco, such as cigarettes, e-cigarettes, and chewing tobacco. If you need help quitting, ask your health care provider.  Do not use street drugs.  Do not share needles.  Ask your health care provider for help if you need support or information about quitting drugs. Alcohol use  Do not drink alcohol if your health care provider tells you not to drink.  If you drink alcohol: ? Limit how much you have to 0-2 drinks a day. ? Be aware of how much alcohol is in your drink. In the [place] one drink equals one 12 oz bottle of beer (355 mL), one 5 oz glass of wine (148 mL), or one 1 oz glass of hard liquor (44 mL). General  instructions  Schedule regular health, dental, and eye exams.  Stay current with your vaccines.  Tell your health care provider if: ? You often feel depressed. ? You have ever been abused or do not feel safe at home. Summary  Adopting a healthy lifestyle and getting preventive care are important in promoting health and wellness.  Follow your health care provider's instructions about healthy diet, exercising, and getting tested or screened for diseases.  Follow your health care provider's instructions on monitoring your cholesterol and blood pressure. This information is not intended to replace advice given to you by your health care provider. Make sure you discuss any questions you have with your health care provider. Document Released: 03/01/2008 Document Revised: 08/27/2018 Document Reviewed: 08/27/2018 Elsevier Patient Education  2020 Elsevier Inc.      Agustina Caroli, MD [facility]

## 2019-03-23 NOTE — Patient Instructions (Addendum)
   If you have lab work done today you will be contacted with your lab results within the next 2 weeks.  If you have not heard from us then please contact us. The fastest way to get your results is to register for My Chart.   IF you received an x-ray today, you will receive an invoice from [facility]. Please contact [facility] at [phone] with questions or concerns regarding your invoice.   IF you received labwork today, you will receive an invoice from [facility]. Please contact [facility] at [phone] with questions or concerns regarding your invoice.   Our billing staff will not be able to assist you with questions regarding bills from these companies.  You will be contacted with the lab results as soon as they are available. The fastest way to get your results is to activate your My Chart account. Instructions are located on the last page of this paperwork. If you have not heard from us regarding the results in 2 weeks, please contact this office.     Health Maintenance, Male Adopting a healthy lifestyle and getting preventive care are important in promoting health and wellness. Ask your health care provider about:  The right schedule for you to have regular tests and exams.  Things you can do on your own to prevent diseases and keep yourself healthy. What should I know about diet, weight, and exercise? Eat a healthy diet   Eat a diet that includes plenty of vegetables, fruits, low-fat dairy products, and lean protein.  Do not eat a lot of foods that are high in solid fats, added sugars, or sodium. Maintain a healthy weight Body mass index (BMI) is a measurement that can be used to identify possible weight problems. It estimates body fat based on height and weight. Your health care provider can help determine your BMI and help you achieve or maintain a healthy weight. Get regular exercise Get regular exercise. This is one of the most important things you  can do for your health. Most adults should:  Exercise for at least 150 minutes each week. The exercise should increase your heart rate and make you sweat (moderate-intensity exercise).  Do strengthening exercises at least twice a week. This is in addition to the moderate-intensity exercise.  Spend less time sitting. Even light physical activity can be beneficial. Watch cholesterol and blood lipids Have your blood tested for lipids and cholesterol at 58 years of age, then have this test every 5 years. You may need to have your cholesterol levels checked more often if:  Your lipid or cholesterol levels are high.  You are older than 58 years of age.  You are at high risk for heart disease. What should I know about cancer screening? Many types of cancers can be detected early and may often be prevented. Depending on your health history and family history, you may need to have cancer screening at various ages. This may include screening for:  Colorectal cancer.  Prostate cancer.  Skin cancer.  Lung cancer. What should I know about heart disease, diabetes, and high blood pressure? Blood pressure and heart disease  High blood pressure causes heart disease and increases the risk of stroke. This is more likely to develop in people who have high blood pressure readings, are of African descent, or are overweight.  Talk with your health care provider about your target blood pressure readings.  Have your blood pressure checked: ? Every 3-5 years if you are 18-39 years   of age. ? Every year if you are 40 years old or older.  If you are between the ages of 65 and 75 and are a current or former smoker, ask your health care provider if you should have a one-time screening for abdominal aortic aneurysm (AAA). Diabetes Have regular diabetes screenings. This checks your fasting blood sugar level. Have the screening done:  Once every three years after age 45 if you are at a normal weight and have  a low risk for diabetes.  More often and at a younger age if you are overweight or have a high risk for diabetes. What should I know about preventing infection? Hepatitis B If you have a higher risk for hepatitis B, you should be screened for this virus. Talk with your health care provider to find out if you are at risk for hepatitis B infection. Hepatitis C Blood testing is recommended for:  Everyone born from 1945 through 1965.  Anyone with known risk factors for hepatitis C. Sexually transmitted infections (STIs)  You should be screened each year for STIs, including gonorrhea and chlamydia, if: ? You are sexually active and are younger than 58 years of age. ? You are older than 58 years of age and your health care provider tells you that you are at risk for this type of infection. ? Your sexual activity has changed since you were last screened, and you are at increased risk for chlamydia or gonorrhea. Ask your health care provider if you are at risk.  Ask your health care provider about whether you are at high risk for HIV. Your health care provider may recommend a prescription medicine to help prevent HIV infection. If you choose to take medicine to prevent HIV, you should first get tested for HIV. You should then be tested every 3 months for as long as you are taking the medicine. Follow these instructions at home: Lifestyle  Do not use any products that contain nicotine or tobacco, such as cigarettes, e-cigarettes, and chewing tobacco. If you need help quitting, ask your health care provider.  Do not use street drugs.  Do not share needles.  Ask your health care provider for help if you need support or information about quitting drugs. Alcohol use  Do not drink alcohol if your health care provider tells you not to drink.  If you drink alcohol: ? Limit how much you have to 0-2 drinks a day. ? Be aware of how much alcohol is in your drink. In the [place] one drink equals one 12  oz bottle of beer (355 mL), one 5 oz glass of wine (148 mL), or one 1 oz glass of hard liquor (44 mL). General instructions  Schedule regular health, dental, and eye exams.  Stay current with your vaccines.  Tell your health care provider if: ? You often feel depressed. ? You have ever been abused or do not feel safe at home. Summary  Adopting a healthy lifestyle and getting preventive care are important in promoting health and wellness.  Follow your health care provider's instructions about healthy diet, exercising, and getting tested or screened for diseases.  Follow your health care provider's instructions on monitoring your cholesterol and blood pressure. This information is not intended to replace advice given to you by your health care provider. Make sure you discuss any questions you have with your health care provider. Document Released: 03/01/2008 Document Revised: 08/27/2018 Document Reviewed: 08/27/2018 Elsevier Patient Education  2020 Elsevier Inc.  

## 2019-03-26 ENCOUNTER — Encounter: Payer: Self-pay | Admitting: Emergency Medicine

## 2019-03-26 ENCOUNTER — Telehealth: Payer: Self-pay | Admitting: Emergency Medicine

## 2019-03-26 LAB — TESTT+TESTF+SHBG
Sex Hormone Binding: 28.4 nmol/L (ref 19.3–76.4)
Testosterone, Free: 6.9 pg/mL — ABNORMAL LOW (ref 7.2–24.0)
Testosterone, Total, LC/MS: 322.5 ng/dL (ref 264.0–916.0)

## 2019-03-26 LAB — COMPREHENSIVE METABOLIC PANEL
ALT: 25 IU/L (ref 0–44)
AST: 25 IU/L (ref 0–40)
Albumin/Globulin Ratio: 1.6 (ref 1.2–2.2)
Albumin: 4.4 g/dL (ref 3.8–4.9)
Alkaline Phosphatase: 71 IU/L (ref 39–117)
BUN/Creatinine Ratio: 12 (ref 9–20)
BUN: 13 mg/dL (ref 6–24)
Bilirubin Total: 1.1 mg/dL (ref 0.0–1.2)
CO2: 20 mmol/L (ref 20–29)
Calcium: 9.2 mg/dL (ref 8.7–10.2)
Chloride: 99 mmol/L (ref 96–106)
Creatinine, Ser: 1.1 mg/dL (ref 0.76–1.27)
GFR calc Af Amer: 86 mL/min/{1.73_m2} (ref >59)
GFR calc non Af Amer: 74 mL/min/{1.73_m2} (ref >59)
Globulin, Total: 2.8 g/dL (ref 1.5–4.5)
Glucose: 93 mg/dL (ref 65–99)
Potassium: 4.2 mmol/L (ref 3.5–5.2)
Sodium: 134 mmol/L (ref 134–144)
Total Protein: 7.2 g/dL (ref 6.0–8.5)

## 2019-03-26 LAB — CBC WITH DIFFERENTIAL/PLATELET
Basophils Absolute: 0 10*3/uL (ref 0.0–0.2)
Basos: 0 %
EOS (ABSOLUTE): 0.1 10*3/uL (ref 0.0–0.4)
Eos: 1 %
Hematocrit: 44.2 % (ref 37.5–51.0)
Hemoglobin: 14.5 g/dL (ref 13.0–17.7)
Immature Grans (Abs): 0 10*3/uL (ref 0.0–0.1)
Immature Granulocytes: 0 %
Lymphocytes Absolute: 1.8 10*3/uL (ref 0.7–3.1)
Lymphs: 32 %
MCH: 28.2 pg (ref 26.6–33.0)
MCHC: 32.8 g/dL (ref 31.5–35.7)
MCV: 86 fL (ref 79–97)
Monocytes Absolute: 0.5 10*3/uL (ref 0.1–0.9)
Monocytes: 10 %
Neutrophils Absolute: 3.1 10*3/uL (ref 1.4–7.0)
Neutrophils: 57 %
Platelets: 197 10*3/uL (ref 150–450)
RBC: 5.14 x10E6/uL (ref 4.14–5.80)
RDW: 12.9 % (ref 11.6–15.4)
WBC: 5.5 10*3/uL (ref 3.4–10.8)

## 2019-03-26 LAB — LIPID PANEL
Chol/HDL Ratio: 4.6 ratio (ref 0.0–5.0)
Cholesterol, Total: 184 mg/dL (ref 100–199)
HDL: 40 mg/dL (ref >39)
LDL Calculated: 127 mg/dL — ABNORMAL HIGH (ref 0–99)
Triglycerides: 85 mg/dL (ref 0–149)
VLDL Cholesterol Cal: 17 mg/dL (ref 5–40)

## 2019-03-26 LAB — PSA: Prostate Specific Ag, Serum: 2.6 ng/mL (ref 0.0–4.0)

## 2019-03-26 NOTE — Telephone Encounter (Signed)
Copied from [place] [id number]. Topic: General - Inquiry >> Mar 26, 2019 10:04 AM Rainey Pines A wrote: Patient would like PSA  results faxed to [facility] at [phone]. >> Mar 26, 2019 10:07 AM Rainey Pines A wrote: Patient would also like a callback from nurse to go over testosterone result.

## 2019-03-30 NOTE — Telephone Encounter (Signed)
Total testosterone is low normal.  Free testosterone is low.  Par for his age.  I do not recommend supplementation at this point.  If he is interested in testosterone replacement therapy he needs to consult with an endocrinologist.  Thanks.

## 2019-04-02 ENCOUNTER — Encounter: Payer: Self-pay | Admitting: *Deleted

## 2019-04-02 NOTE — Telephone Encounter (Signed)
Message sent my chart  

## 2019-06-12 ENCOUNTER — Other Ambulatory Visit: Payer: Self-pay

## 2019-06-12 DIAGNOSIS — Z20822 Contact with and (suspected) exposure to covid-19: Secondary | ICD-10-CM

## 2019-06-13 LAB — NOVEL CORONAVIRUS, NAA: SARS-CoV-2, NAA: NOT DETECTED

## 2019-06-30 ENCOUNTER — Ambulatory Visit (INDEPENDENT_AMBULATORY_CARE_PROVIDER_SITE_OTHER): Payer: 59 | Admitting: Registered Nurse

## 2019-06-30 ENCOUNTER — Other Ambulatory Visit: Payer: Self-pay

## 2019-06-30 DIAGNOSIS — Z23 Encounter for immunization: Secondary | ICD-10-CM | POA: Diagnosis not present

## 2019-06-30 NOTE — Progress Notes (Signed)
Pt came in for regular flu shot today.

## 2019-09-14 ENCOUNTER — Other Ambulatory Visit: Payer: Self-pay

## 2019-09-14 ENCOUNTER — Encounter: Payer: Self-pay | Admitting: Cardiology

## 2019-09-14 ENCOUNTER — Ambulatory Visit (INDEPENDENT_AMBULATORY_CARE_PROVIDER_SITE_OTHER): Payer: 59 | Admitting: Cardiology

## 2019-09-14 VITALS — BP 112/72 | HR 59 | Ht 70.0 in | Wt 219.0 lb

## 2019-09-14 DIAGNOSIS — Z8249 Family history of ischemic heart disease and other diseases of the circulatory system: Secondary | ICD-10-CM | POA: Diagnosis not present

## 2019-09-14 DIAGNOSIS — E785 Hyperlipidemia, unspecified: Secondary | ICD-10-CM

## 2019-09-14 LAB — COMPREHENSIVE METABOLIC PANEL
ALT: 20 IU/L (ref 0–44)
AST: 24 IU/L (ref 0–40)
Albumin/Globulin Ratio: 1.6 (ref 1.2–2.2)
Albumin: 4.2 g/dL (ref 3.8–4.9)
Alkaline Phosphatase: 76 IU/L (ref 39–117)
BUN/Creatinine Ratio: 12 (ref 9–20)
BUN: 14 mg/dL (ref 6–24)
Bilirubin Total: 1 mg/dL (ref 0.0–1.2)
CO2: 24 mmol/L (ref 20–29)
Calcium: 9.4 mg/dL (ref 8.7–10.2)
Chloride: 104 mmol/L (ref 96–106)
Creatinine, Ser: 1.16 mg/dL (ref 0.76–1.27)
GFR calc Af Amer: 80 mL/min/{1.73_m2} (ref >59)
GFR calc non Af Amer: 69 mL/min/{1.73_m2} (ref >59)
Globulin, Total: 2.6 g/dL (ref 1.5–4.5)
Glucose: 92 mg/dL (ref 65–99)
Potassium: 4.8 mmol/L (ref 3.5–5.2)
Sodium: 139 mmol/L (ref 134–144)
Total Protein: 6.8 g/dL (ref 6.0–8.5)

## 2019-09-14 LAB — LIPID PANEL
Chol/HDL Ratio: 3.6 ratio (ref 0.0–5.0)
Cholesterol, Total: 160 mg/dL (ref 100–199)
HDL: 44 mg/dL (ref >39)
LDL Chol Calc (NIH): 105 mg/dL — ABNORMAL HIGH (ref 0–99)
Triglycerides: 53 mg/dL (ref 0–149)
VLDL Cholesterol Cal: 11 mg/dL (ref 5–40)

## 2019-09-14 LAB — TSH: TSH: 1.37 u[IU]/mL (ref 0.450–4.500)

## 2019-09-14 NOTE — Progress Notes (Signed)
Patient ID: TRAVYON COOLING Colon, male   DOB: 1961/05/09, 58 y.o.   MRN: [id number]    Patient Name: Christopher [name] Colon Date of Encounter: 09/14/2019  Primary Care Provider:  Horald Pollen, MD Primary Cardiologist: Ena Dawley  Problem List   Past Medical History:  Diagnosis Date  . GERD (gastroesophageal reflux disease) 10/31/2011  . Hearing impaired 12/17/2013   lifelong   . Hypogonadism male 10/31/2011   Past Surgical History:  Procedure Laterality Date  . TONSILLECTOMY     Allergies  Allergies  Allergen Reactions  . Asa Arthritis Strength-Antacid [Aspirin Buffered] Swelling  . Penicillins Hives  . Sulfa Antibiotics Hives   HPI  Pleasant 58 year old-year-old gentleman originally from [place] who is coming with concerns of palpitations and shortness of breath. The patient is generally very healthy and has no prior medical history not significant family history of premature coronary artery disease. 3 of his cousins at his age has known coronary artery disease 2 of them underwent coronary artery bypass surgery and one of them passed away from heart attack. The patient exercises by lifting weights with no significant symptoms of shortness of breath or chest pain.   09/14/2019, the patient is coming after year, he has been feeling great, he has been exercising more and eating healthy diet resulting in 30 pound weight loss.  He denies any chest pain shortness of breath and he also feels that with weight loss his palpitations have improved significantly.  He has no lower extremity edema no orthopnea no proximal nocturnal dyspnea.  He underwent coronary CTA that showed calcium score of 0 and no evidence for coronary artery disease.  Home Medications  Prior to Admission medications   Not on File   Family History  Family History  Problem Relation Age of Onset  . Cancer Father   . Hyperlipidemia Father   . Heart disease Mother   . Hyperlipidemia Mother     Social History  Social History   Socioeconomic History  . Marital status: Married    Spouse name: Not on file  . Number of children: 2  . Years of education: Not on file  . Highest education level: Not on file  Occupational History  . Occupation: Chemical engineer: [employer]  Tobacco Use  . Smoking status: Never Smoker  . Smokeless tobacco: Never Used  Substance and Sexual Activity  . Alcohol use: No  . Drug use: No  . Sexual activity: Yes  Other Topics Concern  . Not on file  Social History Narrative   Married.  Education: Grade School.   Social Determinants of Health   Financial Resource Strain:   . Difficulty of Paying Living Expenses: Not on file  Food Insecurity:   . Worried About Charity fundraiser in the Last Year: Not on file  . Ran Out of Food in the Last Year: Not on file  Transportation Needs:   . Lack of Transportation (Medical): Not on file  . Lack of Transportation (Non-Medical): Not on file  Physical Activity:   . Days of Exercise per Week: Not on file  . Minutes of Exercise per Session: Not on file  Stress:   . Feeling of Stress : Not on file  Social Connections:   . Frequency of Communication with Friends and Family: Not on file  . Frequency of Social Gatherings with Friends and Family: Not on file  . Attends Religious Services: Not on file  . Active Member  of Clubs or Organizations: Not on file  . Attends Archivist Meetings: Not on file  . Marital Status: Not on file  Intimate Partner Violence:   . Fear of Current or Ex-Partner: Not on file  . Emotionally Abused: Not on file  . Physically Abused: Not on file  . Sexually Abused: Not on file     Review of Systems, as per HPI, otherwise negative General:  No chills, fever, night sweats or weight changes.  Cardiovascular:  No chest pain, dyspnea on exertion, edema, orthopnea, palpitations, paroxysmal nocturnal dyspnea. Dermatological: No rash,  lesions/masses Respiratory: No cough, dyspnea Urologic: No hematuria, dysuria Abdominal:   No nausea, vomiting, diarrhea, bright red blood per rectum, melena, or hematemesis Neurologic:  No visual changes, wkns, changes in mental status. All other systems reviewed and are otherwise negative except as noted above.  Physical Exam  Blood pressure 112/72, pulse (!) 59, height 5\' 10"  (1.778 m), weight 219 lb (99.3 kg), SpO2 98 %.  General: Pleasant, NAD Psych: Normal affect. Neuro: Alert and oriented X 3. Moves all extremities spontaneously. HEENT: Normal  Neck: Supple without bruits or JVD. Lungs:  Resp regular and unlabored, CTA. Heart: RRR no s3, s4, or murmurs. Abdomen: Soft, non-tender, non-distended, BS + x 4.  Extremities: No clubbing, cyanosis or edema. DP/PT/Radials 2+ and equal bilaterally.  Labs:  No results for input(s): CKTOTAL, CKMB, TROPONINI in the last 72 hours. Lab Results  Component Value Date   WBC 5.5 03/23/2019   HGB 14.5 03/23/2019   HCT 44.2 03/23/2019   MCV 86 03/23/2019   PLT 197 03/23/2019    No results found for: DDIMER Invalid input(s): POCBNP    Component Value Date/Time   NA 134 03/23/2019 1103   K 4.2 03/23/2019 1103   CL 99 03/23/2019 1103   CO2 20 03/23/2019 1103   GLUCOSE 93 03/23/2019 1103   GLUCOSE 105 (H) 12/27/2015 0859   BUN 13 03/23/2019 1103   CREATININE 1.10 03/23/2019 1103   CREATININE 1.06 12/27/2015 0859   CALCIUM 9.2 03/23/2019 1103   PROT 7.2 03/23/2019 1103   ALBUMIN 4.4 03/23/2019 1103   AST 25 03/23/2019 1103   ALT 25 03/23/2019 1103   ALKPHOS 71 03/23/2019 1103   BILITOT 1.1 03/23/2019 1103   GFRNONAA 74 03/23/2019 1103   GFRNONAA 79 12/27/2015 0859   GFRAA 86 03/23/2019 1103   GFRAA >89 12/27/2015 0859   Lab Results  Component Value Date   CHOL 184 03/23/2019   HDL 40 03/23/2019   LDLCALC 127 (H) 03/23/2019   TRIG 85 03/23/2019   ETT: 02/2014 ETT Interpretation:  normal - no evidence of ischemia by ST  analysis Comments: Patient presents today for routine GXT. Has strong FH for CAD - he is obese. Has had some dyspnea and palpitations.   Today the patient exercised on the standard Bruce protocol for a total of 9 minutes.  Good exercise tolerance.  Adequate blood pressure response.  Clinically negative for chest pain. Test was stopped due to achievement of target HR.  EKG negative for ischemia. No significant arrhythmia noted.   48-Holter monitor  Rare PVCs, infrequent PACs.  No arrhythmias or significant pauses identified. Normal Holter monitor, no therapy necessary.  Accessory Clinical Findings  Echocardiogram - 2009,Overall left ventricular systolic function was normal. Left ventricular ejection fraction was estimated to be 60 %. There were no left ventricular regional wall motion abnormalities. - Estimated peak pulmonary artery systolic pressure 24 mmHg.  TTE: 01/25/2016 -  Left ventricle: The cavity size was normal. Wall thickness was  normal. Systolic function was normal. The estimated ejection  fraction was in the range of 60% to 65%. Wall motion was normal;  there were no regional wall motion abnormalities. Left  ventricular diastolic function parameters were normal. - Mitral valve: Mildly thickened leaflets . There was trivial  regurgitation. - Left atrium: The atrium was normal in size. - Right atrium: The atrium was mildly dilated. - Inferior vena cava: The vessel was normal in size. The  respirophasic diameter changes were in the normal range (>= 50%),  consistent with normal central venous pressure.  Impressions: - LVEF 60-65%, normal wall thickness, normal wall motion, normal  diastolic function, normal LA size, mildly dilated RA, no  significant TR, normal IVC.  ECG - sinus rhythm, normal EKG.  CCTA: 06/2018   1. Coronary calcium score of 0. This was 0 percentile for age and sex matched control.  2. Normal coronary origin with right  dominance.  3. No evidence of CAD.  4.  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   Assessment & Plan  1.  Chest pain -the pain is rather atypical, he has calcium score of 0 and no evidence for CAD on coronary CTA, we will continue preventive therapy.  We will recheck his lipids today.  He is encouraged to continue exercising and eat healthy.  2.  Palpitations - underwent 48 hour Holter monitor during which he had an episode of presyncope showed only a few pieces PVCs otherwise no arrhythmias or pauses.  Symptoms significantly improved with weight loss.  3. Hyperlipidemia - LDL 146-->127, he has lost 30 pounds, his calcium score is 0 and he has no evidence for CAD.  We will recheck his lipids and CMP today however will focus on healthy diet and ongoing exercise.  The patient is congratulated on his efforts.  Follow-up in 1 year.  Ena Dawley, MD, [facility] 09/14/2019, 9:31 AM

## 2019-09-14 NOTE — Patient Instructions (Signed)
Medication Instructions:   Your physician recommends that you continue on your current medications as directed. Please refer to the Current Medication list given to you today.  *If you need a refill on your cardiac medications before your next appointment, please call your pharmacy*   Lab Work:  TODAY--CMET, TSH, AND LIPIDS  If you have labs (blood work) drawn today and your tests are completely normal, you will receive your results only by: Marland Kitchen MyChart Message (if you have MyChart) OR . A paper copy in the mail If you have any lab test that is abnormal or we need to change your treatment, we will call you to review the results.    Follow-Up: At [facility], you and your health needs are our priority.  As part of our continuing mission to provide you with exceptional heart care, we have created designated Provider Care Teams.  These Care Teams include your primary Cardiologist (physician) and Advanced Practice Providers (APPs -  Physician Assistants and Nurse Practitioners) who all work together to provide you with the care you need, when you need it.  Your next appointment:   12 month(s)  The format for your next appointment:   In Person  Provider:   Ena Dawley, MD

## 2019-10-12 ENCOUNTER — Ambulatory Visit: Payer: 59 | Admitting: Emergency Medicine

## 2019-10-12 ENCOUNTER — Other Ambulatory Visit: Payer: Self-pay

## 2019-10-12 ENCOUNTER — Encounter: Payer: Self-pay | Admitting: Emergency Medicine

## 2019-10-12 VITALS — BP 126/77 | HR 68 | Temp 98.1°F | Wt 222.4 lb

## 2019-10-12 DIAGNOSIS — N401 Enlarged prostate with lower urinary tract symptoms: Secondary | ICD-10-CM | POA: Diagnosis not present

## 2019-10-12 NOTE — Progress Notes (Signed)
Christopher [name] Colon 59 y.o.   Chief Complaint  Patient presents with  . Follow-up    here to f/u and to check psa levels. (patients  last PSA was done on 03/23/19)    HISTORY OF PRESENT ILLNESS: This is a 59 y.o. male with history of BPH and LUTS concerned about rising PSA levels. Has seen a urologist last summer and scheduled to see him again next Monday.  Plan is to get a prostate biopsy. Patient requesting to get PSA repeated today.  HPI   Prior to Admission medications   Not on File    Allergies  Allergen Reactions  . Asa Arthritis Strength-Antacid [Aspirin Buffered] Swelling  . Penicillins Hives  . Sulfa Antibiotics Hives    Patient Active Problem List   Diagnosis Date Noted  . Family history of premature CAD 01/13/2016  . Hyperlipidemia 01/13/2016  . Low testosterone in male 05/07/2014  . Hearing impaired 12/17/2013    Past Medical History:  Diagnosis Date  . GERD (gastroesophageal reflux disease) 10/31/2011  . Hearing impaired 12/17/2013   lifelong   . 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 TONSILLECTOMY      Social History   Socioeconomic History  . Marital status: Married    Spouse name: Not on file  . Number of children: 2  . Years of education: Not on file  . Highest education level: Not on file  Occupational History  . Occupation: Chemical engineer: [employer]  Tobacco Use  . Smoking status: Never Smoker  . Smokeless tobacco: Never Used  Substance and Sexual Activity  . Alcohol use: No  . Drug use: No  . Sexual activity: Yes  Other Topics Concern  . Not on file  Social History Narrative   Married.  Education: Grade School.   Social Determinants of Health   Financial Resource Strain:   . Difficulty of Paying Living Expenses: Not on file  Food Insecurity:   . Worried About Charity fundraiser in the Last Year: Not on file  . Ran Out of Food in the Last Year: Not on  file  Transportation Needs:   . Lack of Transportation (Medical): Not on file  . Lack of Transportation (Non-Medical): Not on file  Physical Activity:   . Days of Exercise per Week: Not on file  . Minutes of Exercise per Session: Not on file  Stress:   . Feeling of Stress : Not on file  Social Connections:   . Frequency of Communication with Friends and Family: Not on file  . Frequency of Social Gatherings with Friends and Family: Not on file  . Attends Religious Services: Not on file  . Active Member of Clubs or Organizations: Not on file  . Attends Archivist Meetings: Not on file  . Marital Status: Not on file  Intimate Partner Violence:   . Fear of Current or Ex-Partner: Not on file  . Emotionally Abused: Not on file  . Physically Abused: Not on file  . Sexually Abused: Not on file    Family History  Problem Relation Age of Onset  . Cancer Father   . Hyperlipidemia Father   . Heart disease Mother   . Hyperlipidemia Mother      Review of Systems  Constitutional: Negative.  Negative for chills and fever.  HENT: Negative.  Negative for congestion and sore throat.   Eyes: Negative.   Respiratory: Negative.  Negative for cough and shortness of breath.   Cardiovascular: Negative.  Negative for chest pain and palpitations.  Gastrointestinal: Negative.  Negative for abdominal pain, blood in stool, diarrhea, nausea and vomiting.  Genitourinary: Negative.  Negative for dysuria and hematuria.  Musculoskeletal: Negative.  Negative for myalgias and neck pain.  Skin: Negative.   Neurological: Negative.  Negative for headaches.  All other systems reviewed and are negative.  Today's Vitals   10/12/19 1343  BP: 126/77  Pulse: 68  Temp: 98.1 F (36.7 C)  TempSrc: Temporal  SpO2: 98%  Weight: 222 lb 6.4 oz (100.9 kg)   Body mass index is 31.91 kg/m.   Physical Exam Vitals reviewed.  Constitutional:      Appearance: Normal appearance.  HENT:     Head:  Normocephalic.  Eyes:     Extraocular Movements: Extraocular movements intact.     Pupils: Pupils are equal, round, and reactive to light.  Cardiovascular:     Rate and Rhythm: Normal rate.  Pulmonary:     Effort: Pulmonary effort is normal.  Musculoskeletal:        General: Normal range of motion.     Cervical back: Normal range of motion.  Skin:    General: Skin is warm and dry.  Neurological:     General: No focal deficit present.     Mental Status: He is alert and oriented to person, place, and time.  Psychiatric:        Mood and Affect: Mood normal.        Behavior: Behavior normal.      ASSESSMENT & PLAN: Thompson was seen today for follow-up.  Diagnoses and all orders for this visit:  Benign localized prostatic hyperplasia with lower urinary tract symptoms (LUTS) -     PSA    Patient Instructions       If you have lab work done today you will be contacted with your lab results within the next 2 weeks.  If you have not heard from [place] then please contact us. The fastest way to get your results is to register for My Chart.   IF you received an x-ray today, you will receive an invoice from [facility]. Please contact [facility] at [phone] with questions or concerns regarding your invoice.   IF you received labwork today, you will receive an invoice from [facility]. Please contact [facility] at [phone] with questions or concerns regarding your invoice.   Our billing staff will not be able to assist you with questions regarding bills from these companies.  You will be contacted with the lab results as soon as they are available. The fastest way to get your results is to activate your My Chart account. Instructions are located on the last page of this paperwork. If you have not heard from [place] regarding the results in 2 weeks, please contact this office.      Benign Prostatic Hyperplasia  Benign prostatic hyperplasia (BPH) is an enlarged  prostate gland that is caused by the normal aging process and not by cancer. The prostate is a walnut-sized gland that is involved in the production of semen. It is located in front of the rectum and below the bladder. The bladder stores urine and the urethra is the tube that carries the urine out of the body. The prostate may get bigger as a man gets older. An enlarged prostate can press on the urethra. This can make it harder to pass urine. The build-up of urine in  the bladder can cause infection. Back pressure and infection may progress to bladder damage and kidney (renal) failure. What are the causes? This condition is part of a normal aging process. However, not all men develop problems from this condition. If the prostate enlarges away from the urethra, urine flow will not be blocked. If it enlarges toward the urethra and compresses it, there will be problems passing urine. What increases the risk? This condition is more likely to develop in men over the age of 71 years. What are the signs or symptoms? Symptoms of this condition include:  Getting up often during the night to urinate.  Needing to urinate frequently during the day.  Difficulty starting urine flow.  Decrease in size and strength of your urine stream.  Leaking (dribbling) after urinating.  Inability to pass urine. This needs immediate treatment.  Inability to completely empty your bladder.  Pain when you pass urine. This is more common if there is also an infection.  Urinary tract infection (UTI). How is this diagnosed? This condition is diagnosed based on your medical history, a physical exam, and your symptoms. Tests will also be done, such as:  A post-void bladder scan. This measures any amount of urine that may remain in your bladder after you finish urinating.  A digital rectal exam. In a rectal exam, your health care provider checks your prostate by putting a lubricated, gloved finger into your rectum to feel  the back of your prostate gland. This exam detects the size of your gland and any abnormal lumps or growths.  An exam of your urine (urinalysis).  A prostate specific antigen (PSA) screening. This is a blood test used to screen for prostate cancer.  An ultrasound. This test uses sound waves to electronically produce a picture of your prostate gland. Your health care provider may refer you to a specialist in kidney and prostate diseases (urologist). How is this treated? Once symptoms begin, your health care provider will monitor your condition (active surveillance or watchful waiting). Treatment for this condition will depend on the severity of your condition. Treatment may include:  Observation and yearly exams. This may be the only treatment needed if your condition and symptoms are mild.  Medicines to relieve your symptoms, including: ? Medicines to shrink the prostate. ? Medicines to relax the muscle of the prostate.  Surgery in severe cases. Surgery may include: ? Prostatectomy. In this procedure, the prostate tissue is removed completely through an open incision or with a laparoscope or robotics. ? Transurethral resection of the prostate (TURP). In this procedure, a tool is inserted through the opening at the tip of the penis (urethra). It is used to cut away tissue of the inner core of the prostate. The pieces are removed through the same opening of the penis. This removes the blockage. ? Transurethral incision (TUIP). In this procedure, small cuts are made in the prostate. This lessens the prostate's pressure on the urethra. ? Transurethral microwave thermotherapy (TUMT). This procedure uses microwaves to create heat. The heat destroys and removes a small amount of prostate tissue. ? Transurethral needle ablation (TUNA). This procedure uses radio frequencies to destroy and remove a small amount of prostate tissue. ? Interstitial laser coagulation ([place]). This procedure uses a laser to  destroy and remove a small amount of prostate tissue. ? Transurethral electrovaporization (TUVP). This procedure uses electrodes to destroy and remove a small amount of prostate tissue. ? Prostatic urethral lift. This procedure inserts an implant to push the  lobes of the prostate away from the urethra. Follow these instructions at home:  Take over-the-counter and prescription medicines only as told by your health care provider.  Monitor your symptoms for any changes. Contact your health care provider with any changes.  Avoid drinking large amounts of liquid before going to bed or out in public.  Avoid or reduce how much caffeine or alcohol you drink.  Give yourself time when you urinate.  Keep all follow-up visits as told by your health care provider. This is important. Contact a health care provider if:  You have unexplained back pain.  Your symptoms do not get better with treatment.  You develop side effects from the medicine you are taking.  Your urine becomes very dark or has a bad smell.  Your lower abdomen becomes distended and you have trouble passing your urine. Get help right away if:  You have a fever or chills.  You suddenly cannot urinate.  You feel lightheaded, or very dizzy, or you faint.  There are large amounts of blood or clots in the urine.  Your urinary problems become hard to manage.  You develop moderate to severe low back or flank pain. The flank is the side of your body between the ribs and the hip. These symptoms may represent a serious problem that is an emergency. Do not wait to see if the symptoms will go away. Get medical help right away. Call your local emergency services (911 in the [place] Do not drive yourself to the hospital. Summary  Benign prostatic hyperplasia (BPH) is an enlarged prostate that is caused by the normal aging process and not by cancer.  An enlarged prostate can press on the urethra. This can make it hard to pass  urine.  This condition is part of a normal aging process and is more likely to develop in men over the age of 29 years.  Get help right away if you suddenly cannot urinate. This information is not intended to replace advice given to you by your health care provider. Make sure you discuss any questions you have with your health care provider. Document Revised: 07/29/2018 Document Reviewed: 10/08/2016 Elsevier Patient Education  2020 [employer]      Agustina Caroli, MD [facility]

## 2019-10-12 NOTE — Patient Instructions (Addendum)
   If you have lab work done today you will be contacted with your lab results within the next 2 weeks.  If you have not heard from us then please contact us. The fastest way to get your results is to register for My Chart.   IF you received an x-ray today, you will receive an invoice from [facility]. Please contact  Radiology at [phone] with questions or concerns regarding your invoice.   IF you received labwork today, you will receive an invoice from [facility]. Please contact [facility] at [phone] with questions or concerns regarding your invoice.   Our billing staff will not be able to assist you with questions regarding bills from these companies.  You will be contacted with the lab results as soon as they are available. The fastest way to get your results is to activate your My Chart account. Instructions are located on the last page of this paperwork. If you have not heard from us regarding the results in 2 weeks, please contact this office.     Benign Prostatic Hyperplasia  Benign prostatic hyperplasia (BPH) is an enlarged prostate gland that is caused by the normal aging process and not by cancer. The prostate is a walnut-sized gland that is involved in the production of semen. It is located in front of the rectum and below the bladder. The bladder stores urine and the urethra is the tube that carries the urine out of the body. The prostate may get bigger as a man gets older. An enlarged prostate can press on the urethra. This can make it harder to pass urine. The build-up of urine in the bladder can cause infection. Back pressure and infection may progress to bladder damage and kidney (renal) failure. What are the causes? This condition is part of a normal aging process. However, not all men develop problems from this condition. If the prostate enlarges away from the urethra, urine flow will not be blocked. If it enlarges toward the urethra and compresses  it, there will be problems passing urine. What increases the risk? This condition is more likely to develop in men over the age of 50 years. What are the signs or symptoms? Symptoms of this condition include:  Getting up often during the night to urinate.  Needing to urinate frequently during the day.  Difficulty starting urine flow.  Decrease in size and strength of your urine stream.  Leaking (dribbling) after urinating.  Inability to pass urine. This needs immediate treatment.  Inability to completely empty your bladder.  Pain when you pass urine. This is more common if there is also an infection.  Urinary tract infection (UTI). How is this diagnosed? This condition is diagnosed based on your medical history, a physical exam, and your symptoms. Tests will also be done, such as:  A post-void bladder scan. This measures any amount of urine that may remain in your bladder after you finish urinating.  A digital rectal exam. In a rectal exam, your health care provider checks your prostate by putting a lubricated, gloved finger into your rectum to feel the back of your prostate gland. This exam detects the size of your gland and any abnormal lumps or growths.  An exam of your urine (urinalysis).  A prostate specific antigen (PSA) screening. This is a blood test used to screen for prostate cancer.  An ultrasound. This test uses sound waves to electronically produce a picture of your prostate gland. Your health care provider may refer you to   a specialist in kidney and prostate diseases (urologist). How is this treated? Once symptoms begin, your health care provider will monitor your condition (active surveillance or watchful waiting). Treatment for this condition will depend on the severity of your condition. Treatment may include:  Observation and yearly exams. This may be the only treatment needed if your condition and symptoms are mild.  Medicines to relieve your symptoms,  including: ? Medicines to shrink the prostate. ? Medicines to relax the muscle of the prostate.  Surgery in severe cases. Surgery may include: ? Prostatectomy. In this procedure, the prostate tissue is removed completely through an open incision or with a laparoscope or robotics. ? Transurethral resection of the prostate (TURP). In this procedure, a tool is inserted through the opening at the tip of the penis (urethra). It is used to cut away tissue of the inner core of the prostate. The pieces are removed through the same opening of the penis. This removes the blockage. ? Transurethral incision (TUIP). In this procedure, small cuts are made in the prostate. This lessens the prostate's pressure on the urethra. ? Transurethral microwave thermotherapy (TUMT). This procedure uses microwaves to create heat. The heat destroys and removes a small amount of prostate tissue. ? Transurethral needle ablation (TUNA). This procedure uses radio frequencies to destroy and remove a small amount of prostate tissue. ? Interstitial laser coagulation (ILC). This procedure uses a laser to destroy and remove a small amount of prostate tissue. ? Transurethral electrovaporization (TUVP). This procedure uses electrodes to destroy and remove a small amount of prostate tissue. ? Prostatic urethral lift. This procedure inserts an implant to push the lobes of the prostate away from the urethra. Follow these instructions at home:  Take over-the-counter and prescription medicines only as told by your health care provider.  Monitor your symptoms for any changes. Contact your health care provider with any changes.  Avoid drinking large amounts of liquid before going to bed or out in public.  Avoid or reduce how much caffeine or alcohol you drink.  Give yourself time when you urinate.  Keep all follow-up visits as told by your health care provider. This is important. Contact a health care provider if:  You have  unexplained back pain.  Your symptoms do not get better with treatment.  You develop side effects from the medicine you are taking.  Your urine becomes very dark or has a bad smell.  Your lower abdomen becomes distended and you have trouble passing your urine. Get help right away if:  You have a fever or chills.  You suddenly cannot urinate.  You feel lightheaded, or very dizzy, or you faint.  There are large amounts of blood or clots in the urine.  Your urinary problems become hard to manage.  You develop moderate to severe low back or flank pain. The flank is the side of your body between the ribs and the hip. These symptoms may represent a serious problem that is an emergency. Do not wait to see if the symptoms will go away. Get medical help right away. Call your local emergency services (911 in the [place] Do not drive yourself to the hospital. Summary  Benign prostatic hyperplasia (BPH) is an enlarged prostate that is caused by the normal aging process and not by cancer.  An enlarged prostate can press on the urethra. This can make it hard to pass urine.  This condition is part of a normal aging process and is more likely to develop in   men over the age of 50 years.  Get help right away if you suddenly cannot urinate. This information is not intended to replace advice given to you by your health care provider. Make sure you discuss any questions you have with your health care provider. Document Revised: 07/29/2018 Document Reviewed: 10/08/2016 Elsevier Patient Education  2020 Elsevier Inc.  

## 2019-10-13 LAB — PSA: Prostate Specific Ag, Serum: 2.5 ng/mL (ref 0.0–4.0)

## 2019-10-30 ENCOUNTER — Other Ambulatory Visit: Payer: Self-pay | Admitting: Urology

## 2019-10-30 ENCOUNTER — Other Ambulatory Visit (HOSPITAL_COMMUNITY): Payer: Self-pay | Admitting: Urology

## 2019-10-30 DIAGNOSIS — C61 Malignant neoplasm of prostate: Secondary | ICD-10-CM

## 2019-11-04 ENCOUNTER — Encounter: Payer: Self-pay | Admitting: *Deleted

## 2019-11-06 ENCOUNTER — Encounter (HOSPITAL_COMMUNITY)
Admission: RE | Admit: 2019-11-06 | Discharge: 2019-11-06 | Disposition: A | Discharge status: Home / Self Care | Payer: 59 | Source: Ambulatory Visit | Attending: Urology | Admitting: Urology

## 2019-11-06 ENCOUNTER — Other Ambulatory Visit: Payer: Self-pay

## 2019-11-06 DIAGNOSIS — C61 Malignant neoplasm of prostate: Secondary | ICD-10-CM | POA: Diagnosis present

## 2019-11-06 MED ORDER — TECHNETIUM TC 99M MEDRONATE IV KIT
20.0000 | PACK | Freq: Once | INTRAVENOUS | Status: AC | PRN
  Administered 2019-11-06: 09:00:00 20 via INTRAVENOUS

## 2019-11-16 NOTE — Progress Notes (Addendum)
GU Location of Tumor / Histology: prostatic adenocarcinoma  If Prostate Cancer, Gleason Score is (4 + 3) and PSA is (3.24). Prostate volume: 29.49 grams.  SANDER MCCRIMON Colon has a history of hypogonadism, ED and family history of prostate ca.     Biopsies of prostate (if applicable) revealed:   Past/Anticipated interventions by urology, if any: prostate biopsy, CT abd/pelvis, bone scan (negative), referred to Dr. Alinda Money to discuss RALP (has consulted with Dr. Alinda Money),  referral to Dr. Tammi Klippel to discuss radiotherapy options.   Past/Anticipated interventions by medical oncology, if any: no  Weight changes, if any: denies  Bowel/Bladder complaints, if any: IPSS 6. SHIM 20.  Patient reports a good force of stream and feels like he is emptying his bladder well. He reports occasional urgency and frequency, nut isn't bothered by it. Reports nocturia x 1. Denies dysuria or hematuria.   Nausea/Vomiting, if any: denies  Pain issues, if any:  denies  SAFETY ISSUES:  Prior radiation? denies  Pacemaker/ICD? denies  Possible current pregnancy? no, male patient  Is the patient on methotrexate? denies  Current Complaints / other details:  59 year old male. Married. No children. Prostate cancer - father.

## 2019-11-17 ENCOUNTER — Encounter: Payer: Self-pay | Admitting: Radiation Oncology

## 2019-11-17 ENCOUNTER — Ambulatory Visit
Admission: RE | Admit: 2019-11-17 | Discharge: 2019-11-17 | Disposition: A | Discharge status: Home / Self Care | Payer: 59 | Source: Ambulatory Visit | Attending: Radiation Oncology | Admitting: Radiation Oncology

## 2019-11-17 ENCOUNTER — Other Ambulatory Visit: Payer: Self-pay

## 2019-11-17 VITALS — Ht 70.0 in | Wt 216.0 lb

## 2019-11-17 DIAGNOSIS — C61 Malignant neoplasm of prostate: Secondary | ICD-10-CM | POA: Insufficient documentation

## 2019-11-17 HISTORY — DX: Malignant neoplasm of prostate: C61

## 2019-11-17 NOTE — Progress Notes (Signed)
Radiation Oncology         [phone] ________________________________  Initial Outpatient Consultation - Conducted via MyChart due to current [id number] concerns for limiting patient exposure  Name: Christopher [name] [name] MRN: [id number]  Date: 11/17/2019  DOB: [birth year]  CB:[id number], Ines Bloomer, MD  [place], [place]: Davis Gourd*  DIAGNOSIS: 59 y.o. gentleman with Stage T1c adenocarcinoma of the prostate with Gleason score of 4+3, and PSA of 3.24.    ICD-10-CM   1. Malignant neoplasm of prostate ([place])  C61     HISTORY OF PRESENT ILLNESS: Christopher [name] is a 59 y.o. male with a diagnosis of prostate cancer. He has a history of hypogonadism but has never been on TRT. He was noted to have a rising PSA of 2.6 by his primary care physician, Dr. Mitchel Honour.  It has been slowly rising since 2017.  Accordingly, he was referred for evaluation in urology by Dr. Lovena Neighbours on 03/27/2019,  digital rectal examination was performed at that time revealing no nodules.  The decision was for repeat PSA in 6 weeks and this PSA was decreased to 1.97 in 04/2019.  However, it increased up to 3.24 in 09/2019, Digital rectal exam performed at follow up on 10/08/2019 showed a slightly larger right lobe but no discrete nodularity. The patient proceeded to transrectal ultrasound with 12 biopsies of the prostate on 10/19/2019.  The prostate volume measured 29.5 cc.  Out of 12 core biopsies, 4 were positive, all on the right.  The maximum Gleason score was 4+3, and this was seen in the right mid, right apex, and right mid lateral. Additionally, Gleason 3+4 was seen in the right apex lateral.  He underwent bone scan on 11/05/2018 showing no evidence of osseous metastatic disease. A CT A/P on 11/12/19 was also negative for metastatic disease.  He met with Dr. Alinda Money earlier today to discuss prostatectomy and he has kindly been referred today for discussion of potential radiation  treatment options.  PREVIOUS RADIATION THERAPY: No  PAST MEDICAL HISTORY:  Past Medical History:  Diagnosis Date  . GERD (gastroesophageal reflux disease) 10/31/2011  . Hearing impaired 12/17/2013   lifelong   . Hypogonadism male 10/31/2011  . Prostate cancer ([place])       PAST SURGICAL HISTORY: Past Surgical History:  Procedure Laterality Date  . PROSTATE BIOPSY    . TONSILLECTOMY      FAMILY HISTORY:  Family History  Problem Relation Age of Onset  . Hyperlipidemia Father   . Prostate cancer Father        treated with ADT and XRT  . Heart disease Mother   . Hyperlipidemia Mother   . Thyroid cancer Sister   . Breast cancer Neg Hx   . Pancreatic cancer Neg Hx   . [name] cancer Neg Hx     SOCIAL HISTORY:  Social History   Socioeconomic History  . Marital status: Married    Spouse name: Not on file  . Number of children: 2  . Years of education: Not on file  . Highest education level: Not on file  Occupational History  . Occupation: Chemical engineer: [employer]  Tobacco Use  . Smoking status: Never Smoker  . Smokeless tobacco: Never Used  Substance and Sexual Activity  . Alcohol use: No  . Drug use: No  . Sexual activity: Yes  Other Topics Concern  . Not on file  Social History Narrative   Married.  Education: Grade School.   Social Determinants of Health   Financial Resource Strain:   . Difficulty of Paying Living Expenses: Not on file  Food Insecurity:   . Worried About Charity fundraiser in the Last Year: Not on file  . Ran Out of Food in the Last Year: Not on file  Transportation Needs:   . Lack of Transportation (Medical): Not on file  . Lack of Transportation (Non-Medical): Not on file  Physical Activity:   . Days of Exercise per Week: Not on file  . Minutes of Exercise per Session: Not on file  Stress:   . Feeling of Stress : Not on file  Social Connections:   . Frequency of Communication with Friends and Family: Not on file   . Frequency of Social Gatherings with Friends and Family: Not on file  . Attends Religious Services: Not on file  . Active Member of Clubs or Organizations: Not on file  . Attends Archivist Meetings: Not on file  . Marital Status: Not on file  Intimate Partner Violence:   . Fear of Current or Ex-Partner: Not on file  . Emotionally Abused: Not on file  . Physically Abused: Not on file  . Sexually Abused: Not on file    ALLERGIES: Asa arthritis strength-antacid [aspirin buffered], Penicillins, and Sulfa antibiotics  MEDICATIONS:  No current outpatient medications on file.   No current facility-administered medications for this encounter.    REVIEW OF SYSTEMS:  On review of systems, the patient reports that he is doing well overall. He denies any chest pain, shortness of breath, cough, fevers, chills, night sweats, unintended weight changes. He denies any bowel disturbances, and denies abdominal pain, nausea or vomiting. He denies any new musculoskeletal or joint aches or pains. His IPSS was 6, indicating mild urinary symptoms. He reports occasional urgency and frequency, which is not bothersome, and nocturia x1. His SHIM was 20, indicating he has very mild erectile dysfunction. A complete review of systems is obtained and is otherwise negative.    PHYSICAL EXAM:  Wt Readings from Last 3 Encounters:  11/17/19 216 lb (98 kg)  10/12/19 222 lb 6.4 oz (100.9 kg)  09/14/19 219 lb (99.3 kg)   Temp Readings from Last 3 Encounters:  10/12/19 98.1 F (36.7 C) (Temporal)  03/23/19 98.7 F (37.1 C) (Oral)  10/22/18 99.6 F (37.6 C) (Oral)   BP Readings from Last 3 Encounters:  10/12/19 126/77  09/14/19 112/72  03/23/19 102/68   Pulse Readings from Last 3 Encounters:  10/12/19 68  09/14/19 (!) 59  03/23/19 65   Pain Assessment Pain Score: 0-No pain/10  In general this is a well appearing Hispanic male in no acute distress. He's alert and oriented x4 and appropriate  throughout the examination. Cardiopulmonary assessment is negative for acute distress and he exhibits normal effort.    KPS = 100  100 - Normal; no complaints; no evidence of disease. 90   - Able to carry on normal activity; minor signs or symptoms of disease. 80   - Normal activity with effort; some signs or symptoms of disease. 35   - Cares for self; unable to carry on normal activity or to do active work. 60   - Requires occasional assistance, but is able to care for most of his personal needs. 50   - Requires considerable assistance and frequent medical care. 19   - Disabled; requires special care and assistance. 30   - Severely disabled; hospital  admission is indicated although death not imminent. 84   - Very sick; hospital admission necessary; active supportive treatment necessary. 10   - Moribund; fatal processes progressing rapidly. 0     - Dead  Karnofsky DA, Abelmann [place], Craver LS and Burchenal [facility] 352 [phone]) The use of the nitrogen mustards in the palliative treatment of carcinoma: with particular reference to bronchogenic carcinoma Cancer 1 634-56  LABORATORY DATA:  Lab Results  Component Value Date   WBC 5.5 03/23/2019   HGB 14.5 03/23/2019   HCT 44.2 03/23/2019   MCV 86 03/23/2019   PLT 197 03/23/2019   Lab Results  Component Value Date   NA 139 09/14/2019   K 4.8 09/14/2019   CL 104 09/14/2019   CO2 24 09/14/2019   Lab Results  Component Value Date   ALT 20 09/14/2019   AST 24 09/14/2019   ALKPHOS 76 09/14/2019   BILITOT 1.0 09/14/2019     RADIOGRAPHY: NM Bone Scan Whole Body  Result Date: 11/06/2019 CLINICAL DATA:  Prostate cancer.  Elevated PSA. EXAM: NUCLEAR MEDICINE WHOLE BODY BONE SCAN TECHNIQUE: Whole body anterior and posterior images were obtained approximately 3 hours after intravenous injection of radiopharmaceutical. RADIOPHARMACEUTICALS:  21.6 mCi Technetium-53mMDP IV COMPARISON:  None. FINDINGS: Mild degenerative uptake is present at the knees  bilaterally. Uptake is also noted at the [facility] in stern[facility]avicular joints bilaterally. No discrete osseous uptake is present to suggest metastatic disease. IMPRESSION: 1. No evidence for metastatic disease to the axial skeleton. 2. Degenerative changes as detailed above. Electronically Signed   By: CSan MorelleM.D.   On: 11/06/2019 17:35      IMPRESSION/PLAN: This visit was conducted via MyChart to spare the patient unnecessary potential exposure in the healthcare setting during the current COVID-19 pandemic. 1. 59y.o. gentleman with Stage T1c adenocarcinoma of the prostate with Gleason Score of 4+3, and PSA of 2.5. We discussed the patient's workup and outlined the nature of prostate cancer in this setting. The patient's T stage, Gleason's score, and PSA put him into the unfavorable intermediate risk group. Accordingly, he is eligible for a variety of potential treatment options including brachytherapy, 5.5 weeks of external radiation, or prostatectomy. We discussed the available radiation techniques, and focused on the details and logistics of delivery. We discussed and outlined the risks, benefits, short and long-term effects associated with radiotherapy and compared and contrasted these with prostatectomy. We discussed the role of SpaceOAR in reducing the rectal toxicity associated with radiotherapy. We briefly discussed the role of ADT in the treatment of higher risk prostate cancer and that we do not feel this is necessary in his case as the risks and side effects likely outweigh the benefits. He was encouraged to ask questions that were answered to his stated satisfaction.  At the end of the conversation the patient remains undecided regarding his treatment preference and would like to take some more time to consider the options with his wife. We will share our discussion with Dr. WLovena Neighboursand look forward to hearing from him in the near future with his decision.  Given current concerns  for patient exposure during the COVID-19 pandemic, this encounter was conducted via video-enabled MyChart visit. The patient has given verbal consent for this type of encounter. The time spent during this encounter was 60 minutes. The attendants for this meeting include MTyler PitaMD, Ashlyn Bruning PA-C, KBlende and patient, AGEARL BARATTAColon and his wife. During the encounter, MTyler PitaMD,  Ashlyn Bruning PA-C, and scribe, Wilburn Mylar were located at [facility] Radiation Oncology Department.  Patient, AWS SHERE [name] and his wife were located at home.    Nicholos Johns, PA-C    Tyler Pita, MD  [facility] Direct Dial: [phone]  Fax: [phone] [email or web address]  Skype  [email or web address]  This document serves as a record of services personally performed by Tyler Pita, MD and Freeman Caldron, PA-C. It was created on their behalf by Wilburn Mylar, a trained medical scribe. The creation of this record is based on the scribe's personal observations and the provider's statements to them. This document has been checked and approved by the attending provider.

## 2019-11-18 ENCOUNTER — Encounter: Payer: Self-pay | Admitting: *Deleted

## 2019-11-24 ENCOUNTER — Other Ambulatory Visit: Payer: Self-pay | Admitting: Urology

## 2019-11-26 ENCOUNTER — Telehealth: Payer: Self-pay | Admitting: Medical Oncology

## 2019-11-26 NOTE — Telephone Encounter (Signed)
Spoke with patient to introduce myself as the prostate nurse navigator and discuss my role. I was unable to meet him 3/2, when he consulted with Dr. Tammi Klippel. He has decided on robotic prostatectomy. He is scheduled for 4/12 with Dr. Alinda Money. I wished him well, and asked him to call if I can assist him in any way.

## 2019-12-16 NOTE — Patient Instructions (Addendum)
DUE TO COVID-19 ONLY ONE VISITOR IS ALLOWED TO COME WITH YOU AND STAY IN THE WAITING ROOM ONLY DURING PRE OP AND PROCEDURE DAY OF SURGERY. THE 1 VISITOR MAY VISIT WITH YOU AFTER SURGERY IN YOUR PRIVATE ROOM DURING VISITING HOURS ONLY!  YOU NEED TO HAVE A COVID 19 TEST ON: 12/24/19 @ 12:30 pm , THIS TEST MUST BE DONE BEFORE SURGERY, COME  [address] [place] , [id number].  ([place]) ONCE YOUR COVID TEST IS COMPLETED, PLEASE BEGIN THE QUARANTINE INSTRUCTIONS AS OUTLINED IN YOUR HANDOUT.                Christopher [name]    Your procedure is scheduled on: 12/28/19   Report to [facility] Main  Entrance   Report to short stay at: 5:30 AM     Call this number if you have problems the morning of surgery [phone]    Remember: Do not eat food or drink liquids :After Midnight.   BRUSH YOUR TEETH MORNING OF SURGERY AND RINSE YOUR MOUTH OUT, NO CHEWING GUM CANDY OR MINTS.     Take these medicines the morning of surgery with A SIP OF WATER: N/A                                 You may not have any metal on your body including hair pins and              piercings  Do not wear jewelry, lotions, powders or perfumes, deodorant             Men may shave face and neck.   Do not bring valuables to the hospital. [place].  Contacts, dentures or bridgework may not be worn into surgery.  Leave suitcase in the car. After surgery it may be brought to your room.     Patients discharged the day of surgery will not be allowed to drive home. IF YOU ARE HAVING SURGERY AND GOING HOME THE SAME DAY, YOU MUST HAVE AN ADULT TO DRIVE YOU HOME AND BE WITH YOU FOR 24 HOURS. YOU MAY GO HOME BY TAXI OR UBER OR ORTHERWISE, BUT AN ADULT MUST ACCOMPANY YOU HOME AND STAY WITH YOU FOR 24 HOURS.  Name and phone number of your driver:  Special Instructions: N/A              Please read over the following fact sheets you were  given: _____________________________________________________________________             [facility] - Preparing for Surgery Before surgery, you can play an important role.  Because skin is not sterile, your skin needs to be as free of germs as possible.  You can reduce the number of germs on your skin by washing with CHG (chlorahexidine gluconate) soap before surgery.  CHG is an antiseptic cleaner which kills germs and bonds with the skin to continue killing germs even after washing. Please DO NOT use if you have an allergy to CHG or antibacterial soaps.  If your skin becomes reddened/irritated stop using the CHG and inform your nurse when you arrive at Short Stay. Do not shave (including legs and underarms) for at least 48 hours prior to the first CHG shower.  You may shave your face/neck. Please follow  these instructions carefully:  1.  Shower with CHG Soap the night before surgery and the  morning of Surgery.  2.  If you choose to wash your hair, wash your hair first as usual with your  normal  shampoo.  3.  After you shampoo, rinse your hair and body thoroughly to remove the  shampoo.                           4.  Use CHG as you would any other liquid soap.  You can apply chg directly  to the skin and wash                       Gently with a scrungie or clean washcloth.  5.  Apply the CHG Soap to your body ONLY FROM THE NECK DOWN.   Do not use on face/ open                           Wound or open sores. Avoid contact with eyes, ears mouth and genitals (private parts).                       Wash face,  Genitals (private parts) with your normal soap.             6.  Wash thoroughly, paying special attention to the area where your surgery  will be performed.  7.  Thoroughly rinse your body with warm water from the neck down.  8.  DO NOT shower/wash with your normal soap after using and rinsing off  the CHG Soap.                9.  Pat yourself dry with a clean towel.            10.  Wear  clean pajamas.            11.  Place clean sheets on your bed the night of your first shower and do not  sleep with pets. Day of Surgery : Do not apply any lotions/deodorants the morning of surgery.  Please wear clean clothes to the hospital/surgery center.  FAILURE TO FOLLOW THESE INSTRUCTIONS MAY RESULT IN THE CANCELLATION OF YOUR SURGERY PATIENT SIGNATURE_________________________________  NURSE SIGNATURE__________________________________  ________________________________________________________________________   Christopher [name]  An incentive spirometer is a tool that can help keep your lungs clear and active. This tool measures how well you are filling your lungs with each breath. Taking long deep breaths may help reverse or decrease the chance of developing breathing (pulmonary) problems (especially infection) following:  A long period of time when you are unable to move or be active. BEFORE THE PROCEDURE   If the spirometer includes an indicator to show your best effort, your nurse or respiratory therapist will set it to a desired goal.  If possible, sit up straight or lean slightly forward. Try not to slouch.  Hold the incentive spirometer in an upright position. INSTRUCTIONS FOR USE  1. Sit on the edge of your bed if possible, or sit up as far as you can in bed or on a chair. 2. Hold the incentive spirometer in an upright position. 3. Breathe out normally. 4. Place the mouthpiece in your mouth and seal your lips tightly around it. 5. Breathe in slowly and as deeply as possible, raising the piston or the ball toward the  top of the column. 6. Hold your breath for 3-5 seconds or for as long as possible. Allow the piston or ball to fall to the bottom of the column. 7. Remove the mouthpiece from your mouth and breathe out normally. 8. Rest for a few seconds and repeat Steps 1 through 7 at least 10 times every 1-2 hours when you are awake. Take your time and take a few normal  breaths between deep breaths. 9. The spirometer may include an indicator to show your best effort. Use the indicator as a goal to work toward during each repetition. 10. After each set of 10 deep breaths, practice coughing to be sure your lungs are clear. If you have an incision (the cut made at the time of surgery), support your incision when coughing by placing a pillow or rolled up towels firmly against it. Once you are able to get out of bed, walk around indoors and cough well. You may stop using the incentive spirometer when instructed by your caregiver.  RISKS AND COMPLICATIONS  Take your time so you do not get dizzy or light-headed.  If you are in pain, you may need to take or ask for pain medication before doing incentive spirometry. It is harder to take a deep breath if you are having pain. AFTER USE  Rest and breathe slowly and easily.  It can be helpful to keep track of a log of your progress. Your caregiver can provide you with a simple table to help with this. If you are using the spirometer at home, follow these instructions: [place] IF:   You are having difficultly using the spirometer.  You have trouble using the spirometer as often as instructed.  Your pain medication is not giving enough relief while using the spirometer.  You develop fever of 100.5 F (38.1 C) or higher. SEEK IMMEDIATE MEDICAL CARE IF:   You cough up bloody sputum that had not been present before.  You develop fever of 102 F (38.9 C) or greater.  You develop worsening pain at or near the incision site. MAKE SURE YOU:   Understand these instructions.  Will watch your condition.  Will get help right away if you are not doing well or get worse. Document Released: 01/14/2007 Document Revised: 11/26/2011 Document Reviewed: 03/17/2007 ExitCare Patient Information 2014 Exit[employer], [place].   ________________________________________________________________________  WHAT IS A BLOOD  TRANSFUSION? Blood Transfusion Information  A transfusion is the replacement of blood or some of its parts. Blood is made up of multiple cells which provide different functions.  Red blood cells carry oxygen and are used for blood loss replacement.  White blood cells fight against infection.  Platelets control bleeding.  Plasma helps clot blood.  Other blood products are available for specialized needs, such as hemophilia or other clotting disorders. BEFORE THE TRANSFUSION  Who gives blood for transfusions?   Healthy volunteers who are fully evaluated to make sure their blood is safe. This is blood bank blood. Transfusion therapy is the safest it has ever been in the practice of medicine. Before blood is taken from a donor, a complete history is taken to make sure that person has no history of diseases nor engages in risky social behavior (examples are intravenous drug use or sexual activity with multiple partners). The donor's travel history is screened to minimize risk of transmitting infections, such as malaria. The donated blood is tested for signs of infectious diseases, such as HIV and hepatitis. The blood is then tested  to be sure it is compatible with you in order to minimize the chance of a transfusion reaction. If you or a relative donates blood, this is often done in anticipation of surgery and is not appropriate for emergency situations. It takes many days to process the donated blood. RISKS AND COMPLICATIONS Although transfusion therapy is very safe and saves many lives, the main dangers of transfusion include:   Getting an infectious disease.  Developing a transfusion reaction. This is an allergic reaction to something in the blood you were given. Every precaution is taken to prevent this. The decision to have a blood transfusion has been considered carefully by your caregiver before blood is given. Blood is not given unless the benefits outweigh the risks. AFTER THE  TRANSFUSION  Right after receiving a blood transfusion, you will usually feel much better and more energetic. This is especially true if your red blood cells have gotten low (anemic). The transfusion raises the level of the red blood cells which carry oxygen, and this usually causes an energy increase.  The nurse administering the transfusion will monitor you carefully for complications. HOME CARE INSTRUCTIONS  No special instructions are needed after a transfusion. You may find your energy is better. Speak with your caregiver about any limitations on activity for underlying diseases you may have. SEEK MEDICAL CARE IF:   Your condition is not improving after your transfusion.  You develop redness or irritation at the intravenous (IV) site. SEEK IMMEDIATE MEDICAL CARE IF:  Any of the following symptoms occur over the next 12 hours:  Shaking chills.  You have a temperature by mouth above 102 F (38.9 C), not controlled by medicine.  Chest, back, or muscle pain.  People around you feel you are not acting correctly or are confused.  Shortness of breath or difficulty breathing.  Dizziness and fainting.  You get a rash or develop hives.  You have a decrease in urine output.  Your urine turns a dark color or changes to pink, red, or brown. Any of the following symptoms occur over the next 10 days:  You have a temperature by mouth above 102 F (38.9 C), not controlled by medicine.  Shortness of breath.  Weakness after normal activity.  The white part of the eye turns yellow (jaundice).  You have a decrease in the amount of urine or are urinating less often.  Your urine turns a dark color or changes to pink, red, or brown. Document Released: 08/31/2000 Document Revised: 11/26/2011 Document Reviewed: 04/19/2008 [facility] Patient Information 2014 [place], [place].  _______________________________________________________________________

## 2019-12-17 ENCOUNTER — Other Ambulatory Visit: Payer: Self-pay

## 2019-12-17 ENCOUNTER — Encounter (HOSPITAL_COMMUNITY)
Admission: RE | Admit: 2019-12-17 | Discharge: 2019-12-17 | Disposition: A | Discharge status: Home / Self Care | Payer: 59 | Source: Ambulatory Visit | Attending: Urology | Admitting: Urology

## 2019-12-17 ENCOUNTER — Encounter (HOSPITAL_COMMUNITY): Payer: Self-pay

## 2019-12-17 DIAGNOSIS — Z01812 Encounter for preprocedural laboratory examination: Secondary | ICD-10-CM | POA: Insufficient documentation

## 2019-12-17 HISTORY — DX: Ventricular premature depolarization: I49.3

## 2019-12-17 LAB — BASIC METABOLIC PANEL
Anion gap: 8 (ref 5–15)
BUN: 22 mg/dL — ABNORMAL HIGH (ref 6–20)
CO2: 25 mmol/L (ref 22–32)
Calcium: 9.1 mg/dL (ref 8.9–10.3)
Chloride: 107 mmol/L (ref 98–111)
Creatinine, Ser: 0.99 mg/dL (ref 0.61–1.24)
GFR calc Af Amer: >60 mL/min (ref >60)
GFR calc non Af Amer: >60 mL/min (ref >60)
Glucose, Bld: 92 mg/dL (ref 70–99)
Potassium: 4.2 mmol/L (ref 3.5–5.1)
Sodium: 140 mmol/L (ref 135–145)

## 2019-12-17 LAB — CBC
HCT: 42.7 % (ref 39.0–52.0)
Hemoglobin: 14.1 g/dL (ref 13.0–17.0)
MCH: 29.1 pg (ref 26.0–34.0)
MCHC: 33 g/dL (ref 30.0–36.0)
MCV: 88.2 fL (ref 80.0–100.0)
Platelets: 221 10*3/uL (ref 150–400)
RBC: 4.84 MIL/uL (ref 4.22–5.81)
RDW: 12 % (ref 11.5–15.5)
WBC: 7 10*3/uL (ref 4.0–10.5)
nRBC: 0 % (ref 0.0–0.2)

## 2019-12-17 LAB — TYPE AND SCREEN
ABO/RH(D): O POS
Antibody Screen: NEGATIVE

## 2019-12-17 LAB — ABO/RH: ABO/RH(D): O POS

## 2019-12-17 NOTE — Progress Notes (Signed)
PCP - Dr. Agustina Caroli. LOV: 09/14/19 Cardiologist -   Chest x-ray -  EKG - 03/23/19 EPIC Stress Test -  ECHO -  Cardiac Cath -   Sleep Study -  CPAP -   Fasting Blood Sugar -  Checks Blood Sugar _____ times a day  Blood Thinner Instructions: Aspirin Instructions: Last Dose:  Anesthesia review:   Patient denies shortness of breath, fever, cough and chest pain at PAT appointment   Patient verbalized understanding of instructions that were given to them at the PAT appointment. Patient was also instructed that they will need to review over the PAT instructions again at home before surgery.

## 2019-12-24 ENCOUNTER — Other Ambulatory Visit (HOSPITAL_COMMUNITY)
Admission: RE | Admit: 2019-12-24 | Discharge: 2019-12-24 | Disposition: A | Discharge status: Home / Self Care | Payer: 59 | Source: Ambulatory Visit | Attending: Urology | Admitting: Urology

## 2019-12-24 DIAGNOSIS — Z20822 Contact with and (suspected) exposure to covid-19: Secondary | ICD-10-CM | POA: Diagnosis not present

## 2019-12-24 DIAGNOSIS — Z01812 Encounter for preprocedural laboratory examination: Secondary | ICD-10-CM | POA: Insufficient documentation

## 2019-12-24 LAB — SARS CORONAVIRUS 2 (TAT 6-24 HRS): SARS Coronavirus 2: NEGATIVE

## 2019-12-25 NOTE — Anesthesia Preprocedure Evaluation (Addendum)
Anesthesia Evaluation  Patient identified by MRN, date of birth, ID band Patient awake    Reviewed: Allergy & Precautions, NPO status , Patient's Chart, lab work & pertinent test results  Airway Mallampati: I  TM Distance: >3 FB Neck ROM: Full    Dental no notable dental hx. (+) Dental Advisory Given, Teeth Intact   Pulmonary neg pulmonary ROS,    Pulmonary exam normal breath sounds clear to auscultation       Cardiovascular negative cardio ROS Normal cardiovascular exam Rhythm:Regular Rate:Normal     Neuro/Psych negative neurological ROS  negative psych ROS   GI/Hepatic Neg liver ROS, GERD  ,  Endo/Other  negative endocrine ROS  Renal/GU negative Renal ROS     Musculoskeletal negative musculoskeletal ROS (+)   Abdominal (+) - obese,   Peds  Hematology negative hematology ROS (+)   Anesthesia Other Findings   Reproductive/Obstetrics                           Anesthesia Physical Anesthesia Plan  ASA: II  Anesthesia Plan: General   Post-op Pain Management:    Induction: Intravenous  PONV Risk Score and Plan: 4 or greater and Ondansetron, Dexamethasone and Treatment may vary due to age or medical condition  Airway Management Planned: Oral ETT  Additional Equipment: None  Intra-op Plan:   Post-operative Plan: Extubation in OR  Informed Consent: I have reviewed the patients History and Physical, chart, labs and discussed the procedure including the risks, benefits and alternatives for the proposed anesthesia with the patient or authorized representative who has indicated his/her understanding and acceptance.     Dental advisory given  Plan Discussed with: CRNA  Anesthesia Plan Comments:         Anesthesia Quick Evaluation

## 2019-12-25 NOTE — H&P (Signed)
CC: Prostate Cancer     [name] is a 59 year old gentleman who was noted to have a rising PSA of 3.24 which resulted in a TRUS biopsy of the prostate on 10/19/19. 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   Family history: Father (diagnosed in his 54s). His father was treated with radiation therapy and androgen deprivation.   Imaging studies:  Bone scan(11/06/19): Negative for metastatic disease.  CT abdomen/pelvis(11/12/19): No lymphadenopathy. Negative for metastatic disease.   PMH: He has a history of GERD and hyperlipidemia.  PSH: No abdominal surgery.   TNM stage: cT1c Nx Mx  PSA: 3.24  Gleason score: 4+3=7  Biopsy (10/19/19): 4/12 cores positive  Left: Benign  Right: R apex (40%, 4+3=7), R lateral apex (80%, 3+4=7), R mid (50%, 4+3=7), R lateral mid (80%, 4+3=7)  Prostate volume: 29.5 cc   Nomogram  OC disease: 65%  EPE: 31%  SVI: 4%  LNI: 5%  PFS (5 year, 10 year): 80%, 68%   Urinary function: IPSS is 10.  Erectile function: SHIM score is 16. He estimates that he can have an erection adequate for intercourse approximately 50% of the time.     ALLERGIES: Aspirin TABS Penicillins Sulfa Drugs    MEDICATIONS: None   GU PSH: Locm 300-399Mg /Ml Iodine,1Ml - 11/12/2019 Prostate Needle Biopsy - 10/19/2019     NON-GU PSH: Remove Tonsils Surgical Pathology, Gross And Microscopic Examination For Prostate Needle - 10/19/2019     GU PMH: Prostate Cancer - 10/29/2019 Elevated PSA - 10/08/2019 Family Hx of Prostate Cancer - 03/27/2019 BPH w/LUTS - 2018 ED due to arterial insufficiency - 2018 Nocturia - 2018 Primary hypogonadism - 2018, Hypogonadism, testicular, - 2014 BPH w/o LUTS, Benign localized prostatic hyperplasia without lower urinary tract symptoms (LUTS) - 2014      PMH Notes:  1898-09-17 00:00:00 - Note: Normal Routine History And Physical Adult   NON-GU PMH: GERD Hyperlipidemia,  unspecified Unspecified hearing loss, unspecified ear    FAMILY HISTORY: Cancer - Father Heart Disease - Mother hyperlipidemia - Father, Mother   SOCIAL HISTORY: Marital Status: Married Preferred Language: English; Ethnicity: ; Race: Other Race Current Smoking Status: Patient has never smoked.   Tobacco Use Assessment Completed: Used Tobacco in last 30 days? Has never drank.  Patient's occupation Research officer, political party.     Notes: no children   REVIEW OF SYSTEMS:    GU Review Male:   Patient denies frequent urination, hard to postpone urination, burning/ pain with urination, get up at night to urinate, leakage of urine, stream starts and stops, trouble starting your streams, and have to strain to urinate .  Gastrointestinal (Lower):   Patient denies diarrhea and constipation.  Gastrointestinal (Upper):   Patient denies nausea and vomiting.  Constitutional:   Patient denies fever, night sweats, weight loss, and fatigue.  Skin:   Patient denies skin rash/ lesion and itching.  Eyes:   Patient denies blurred vision and double vision.  Ears/ Nose/ Throat:   Patient denies sore throat and sinus problems.  Hematologic/Lymphatic:   Patient denies swollen glands and easy bruising.  Cardiovascular:   Patient denies leg swelling and chest pains.  Respiratory:   Patient denies cough and shortness of breath.  Endocrine:   Patient denies excessive thirst.  Musculoskeletal:   Patient denies back pain and joint pain.  Neurological:   Patient denies headaches and dizziness.  Psychologic:   Patient denies depression and anxiety.  VITAL SIGNS:     Weight 216 lb / 97.98 kg  Height 70 in / 177.8 cm  BMI 31.0 kg/m   GU PHYSICAL EXAMINATION:    Prostate: Prostate about 40 grams. Left lobe normal consistency, right lobe normal consistency. Symmetrical lobes. No prostate nodule. Left lobe no tenderness, right lobe no tenderness.    MULTI-SYSTEM PHYSICAL EXAMINATION:    Constitutional:  Well-nourished. No physical deformities. Normally developed. Good grooming.  Neck: Neck symmetrical, not swollen. Normal tracheal position.  Respiratory: No labored breathing, no use of accessory muscles. Clear bilaterally.  Cardiovascular: Normal temperature, normal extremity pulses, no swelling, no varicosities. Regular rate and rhythm.  Lymphatic: No enlargement of neck, axillae, groin.  Skin: No paleness, no jaundice, no cyanosis. No lesion, no ulcer, no rash.  Neurologic / Psychiatric: Oriented to time, oriented to place, oriented to person. No depression, no anxiety, no agitation.  Gastrointestinal: No mass, no tenderness, no rigidity, non obese abdomen.  Eyes: Normal conjunctivae. Normal eyelids.  Ears, Nose, Mouth, and Throat: Left ear no scars, no lesions, no masses. Right ear no scars, no lesions, no masses. Nose no scars, no lesions, no masses. Normal hearing. Normal lips.  Musculoskeletal: Normal gait and station of head and neck.     PAST DATA REVIEWED:  Source Of History:  Patient  Lab Test Review:   PSA  Records Review:   Pathology Reports  Urine Test Review:   Urinalysis  X-Ray Review: C.T. Abdomen/Pelvis: Reviewed Films.  Bone Scan: Reviewed Films.     10/01/19 05/08/19 12/07/11 02/22/06  PSA  Total PSA 3.24 ng/mL 1.97 ng/mL 0.79  0.65   Free PSA    0.28   % Free PSA    43.1     12/07/11  Hormones  Testosterone, Total 255.15    Notes:                     CLINICAL DATA: Prostate cancer   EXAM:  CT ABDOMEN AND PELVIS WITH CONTRAST   TECHNIQUE:  Multidetector CT imaging of the abdomen and pelvis was performed  using the standard protocol following bolus administration of  intravenous contrast.   CONTRAST: 100 mL Omnipaque 300 iodinated contrast IV   COMPARISON: None.   FINDINGS:  Lower chest: No acute abnormality.   Hepatobiliary: No solid liver abnormality is seen. No gallstones,  gallbladder wall thickening, or biliary dilatation.   Pancreas:  Unremarkable. No pancreatic ductal dilatation or  surrounding inflammatory changes.   Spleen: Normal in size without significant abnormality.   Adrenals/Urinary Tract: Adrenal glands are unremarkable. Kidneys are  normal, without renal calculi, solid lesion, or hydronephrosis.  Bladder is unremarkable.   Stomach/Bowel: Stomach is within normal limits. Appendix appears  normal. No evidence of bowel wall thickening, distention, or  inflammatory changes.   Vascular/Lymphatic: No significant vascular findings are present. No  enlarged abdominal or pelvic lymph nodes.   Reproductive: Mild prostatomegaly.   Other: No abdominal wall hernia or abnormality. No abdominopelvic  ascites.   Musculoskeletal: No acute or significant osseous findings.   IMPRESSION:  1. Mild prostatomegaly without evidence of mass or lymphadenopathy  in the abdomen or pelvis.   2. No CT evidence of osseous metastatic disease.    Electronically Signed  By: Eddie Candle M.D.  On: 11/12/2019 15:22   CLINICAL DATA: Prostate cancer. Elevated PSA.   EXAM:  NUCLEAR MEDICINE WHOLE BODY BONE SCAN   TECHNIQUE:  Whole body anterior and posterior images were obtained approximately  3 hours after intravenous injection of radiopharmaceutical.   RADIOPHARMACEUTICALS: 21.6 mCi Technetium-25m MDP IV   COMPARISON: None.   FINDINGS:  Mild degenerative uptake is present at the knees bilaterally. Uptake  is also noted at the [facility] joints in sternoclavicular joints  bilaterally. No discrete osseous uptake is present to suggest  metastatic disease.   IMPRESSION:  1. No evidence for metastatic disease to the axial skeleton.  2. Degenerative changes as detailed above.    Electronically Signed  By: San Morelle M.D.  On: 11/06/2019 17:35      ASSESSMENT:      ICD-10 Details  1 GU:   Prostate Cancer - C61    PLAN:       1. Unfavorable intermediate risk prostate cancer: He has elected to undergo surgical  treatment and will proceed with a unilateral left nerve-sparing robot assisted laparoscopic radical prostatectomy and bilateral pelvic lymphadenectomy.

## 2019-12-28 ENCOUNTER — Encounter (HOSPITAL_COMMUNITY): Payer: Self-pay | Admitting: Urology

## 2019-12-28 ENCOUNTER — Observation Stay (HOSPITAL_COMMUNITY)
Admission: AD | Admit: 2019-12-28 | Discharge: 2019-12-29 | Disposition: A | Discharge status: Home / Self Care | Payer: 59 | Attending: Urology | Admitting: Urology

## 2019-12-28 ENCOUNTER — Other Ambulatory Visit: Payer: Self-pay

## 2019-12-28 ENCOUNTER — Encounter (HOSPITAL_COMMUNITY)
Admission: AD | Disposition: A | Discharge status: Home / Self Care | Payer: Self-pay | Source: Home / Self Care | Attending: Urology

## 2019-12-28 ENCOUNTER — Ambulatory Visit (HOSPITAL_COMMUNITY): Payer: 59 | Admitting: Anesthesiology

## 2019-12-28 DIAGNOSIS — Z8349 Family history of other endocrine, nutritional and metabolic diseases: Secondary | ICD-10-CM | POA: Insufficient documentation

## 2019-12-28 DIAGNOSIS — Z886 Allergy status to analgesic agent status: Secondary | ICD-10-CM | POA: Insufficient documentation

## 2019-12-28 DIAGNOSIS — Z882 Allergy status to sulfonamides status: Secondary | ICD-10-CM | POA: Insufficient documentation

## 2019-12-28 DIAGNOSIS — K219 Gastro-esophageal reflux disease without esophagitis: Secondary | ICD-10-CM | POA: Diagnosis not present

## 2019-12-28 DIAGNOSIS — Z880 Allergy status to penicillin: Secondary | ICD-10-CM | POA: Diagnosis not present

## 2019-12-28 DIAGNOSIS — C61 Malignant neoplasm of prostate: Principal | ICD-10-CM | POA: Insufficient documentation

## 2019-12-28 DIAGNOSIS — E785 Hyperlipidemia, unspecified: Secondary | ICD-10-CM | POA: Diagnosis not present

## 2019-12-28 DIAGNOSIS — Z8249 Family history of ischemic heart disease and other diseases of the circulatory system: Secondary | ICD-10-CM | POA: Diagnosis not present

## 2019-12-28 DIAGNOSIS — Z8042 Family history of malignant neoplasm of prostate: Secondary | ICD-10-CM | POA: Diagnosis not present

## 2019-12-28 DIAGNOSIS — N400 Enlarged prostate without lower urinary tract symptoms: Secondary | ICD-10-CM | POA: Diagnosis not present

## 2019-12-28 HISTORY — PX: LYMPHADENECTOMY: SHX5960

## 2019-12-28 HISTORY — PX: ROBOT ASSISTED LAPAROSCOPIC RADICAL PROSTATECTOMY: SHX5141

## 2019-12-28 LAB — HEMOGLOBIN AND HEMATOCRIT, BLOOD
HCT: 40.5 % (ref 39.0–52.0)
Hemoglobin: 13.5 g/dL (ref 13.0–17.0)

## 2019-12-28 SURGERY — XI ROBOTIC ASSISTED LAPAROSCOPIC RADICAL PROSTATECTOMY LEVEL 2
Anesthesia: General

## 2019-12-28 MED ORDER — SUGAMMADEX SODIUM 500 MG/5ML IV SOLN
INTRAVENOUS | Status: AC
  Filled 2019-12-28: qty 5

## 2019-12-28 MED ORDER — HEPARIN SODIUM (PORCINE) 1000 UNIT/ML IJ SOLN
INTRAMUSCULAR | Status: AC
  Filled 2019-12-28: qty 1

## 2019-12-28 MED ORDER — ZOLPIDEM TARTRATE 5 MG PO TABS: 5.0000 mg | ORAL_TABLET | Freq: Every evening | ORAL | Status: DC | PRN

## 2019-12-28 MED ORDER — LACTATED RINGERS IV SOLN
INTRAVENOUS | Status: DC | PRN
  Administered 2019-12-28: 1000 mL

## 2019-12-28 MED ORDER — BACITRACIN-NEOMYCIN-POLYMYXIN 400-5-5000 EX OINT: 1.0000 "application " | TOPICAL_OINTMENT | Freq: Three times a day (TID) | CUTANEOUS | Status: DC | PRN

## 2019-12-28 MED ORDER — PROPOFOL 10 MG/ML IV BOLUS
INTRAVENOUS | Status: AC
  Filled 2019-12-28: qty 20

## 2019-12-28 MED ORDER — DIPHENHYDRAMINE HCL 12.5 MG/5ML PO ELIX: 12.5000 mg | ORAL_SOLUTION | Freq: Four times a day (QID) | ORAL | Status: DC | PRN

## 2019-12-28 MED ORDER — MIDAZOLAM HCL 2 MG/2ML IJ SOLN
INTRAMUSCULAR | Status: AC
  Filled 2019-12-28: qty 2

## 2019-12-28 MED ORDER — STERILE WATER FOR IRRIGATION IR SOLN
Status: DC | PRN
  Administered 2019-12-28: 1000 mL

## 2019-12-28 MED ORDER — DIPHENHYDRAMINE HCL 50 MG/ML IJ SOLN: 12.5000 mg | Freq: Four times a day (QID) | INTRAMUSCULAR | Status: DC | PRN

## 2019-12-28 MED ORDER — TRAMADOL HCL 50 MG PO TABS: 50.0000 mg | ORAL_TABLET | Freq: Four times a day (QID) | ORAL | 0 refills | Status: DC | PRN

## 2019-12-28 MED ORDER — LIDOCAINE 2% (20 MG/ML) 5 ML SYRINGE
INTRAMUSCULAR | Status: AC
  Filled 2019-12-28: qty 5

## 2019-12-28 MED ORDER — CEFAZOLIN SODIUM-DEXTROSE 1-4 GM/50ML-% IV SOLN
1.0000 g | Freq: Three times a day (TID) | INTRAVENOUS | Status: AC
  Administered 2019-12-28 – 2019-12-29 (×2): 1 g via INTRAVENOUS
  Filled 2019-12-28 (×2): qty 50

## 2019-12-28 MED ORDER — SODIUM CHLORIDE 0.9 % IV BOLUS
1000.0000 mL | Freq: Once | INTRAVENOUS | Status: AC
  Administered 2019-12-28: 1000 mL via INTRAVENOUS

## 2019-12-28 MED ORDER — PROMETHAZINE HCL 25 MG/ML IJ SOLN: 6.2500 mg | INTRAMUSCULAR | Status: DC | PRN

## 2019-12-28 MED ORDER — HYDROMORPHONE HCL 2 MG/ML IJ SOLN
INTRAMUSCULAR | Status: AC
  Filled 2019-12-28: qty 1

## 2019-12-28 MED ORDER — BUPIVACAINE HCL 0.25 % IJ SOLN
INTRAMUSCULAR | Status: AC
  Filled 2019-12-28: qty 1

## 2019-12-28 MED ORDER — ROCURONIUM BROMIDE 10 MG/ML (PF) SYRINGE
PREFILLED_SYRINGE | INTRAVENOUS | Status: AC
  Filled 2019-12-28: qty 10

## 2019-12-28 MED ORDER — FENTANYL CITRATE (PF) 250 MCG/5ML IJ SOLN
INTRAMUSCULAR | Status: AC
  Filled 2019-12-28: qty 5

## 2019-12-28 MED ORDER — CIPROFLOXACIN HCL 500 MG PO TABS: 500.0000 mg | ORAL_TABLET | Freq: Two times a day (BID) | ORAL | 0 refills | Status: DC

## 2019-12-28 MED ORDER — ONDANSETRON HCL 4 MG/2ML IJ SOLN
INTRAMUSCULAR | Status: AC
  Filled 2019-12-28: qty 2

## 2019-12-28 MED ORDER — KETAMINE HCL 10 MG/ML IJ SOLN
INTRAMUSCULAR | Status: DC | PRN
  Administered 2019-12-28: 50 mg via INTRAVENOUS

## 2019-12-28 MED ORDER — MEPERIDINE HCL 50 MG/ML IJ SOLN: 6.2500 mg | INTRAMUSCULAR | Status: DC | PRN

## 2019-12-28 MED ORDER — TRAMADOL HCL 50 MG PO TABS
50.0000 mg | ORAL_TABLET | Freq: Four times a day (QID) | ORAL | Status: DC | PRN
  Administered 2019-12-28: 17:00:00 50 mg via ORAL
  Filled 2019-12-28: qty 1

## 2019-12-28 MED ORDER — LIDOCAINE HCL 2 % IJ SOLN
INTRAMUSCULAR | Status: AC
  Filled 2019-12-28: qty 20

## 2019-12-28 MED ORDER — BELLADONNA ALKALOIDS-OPIUM 16.2-60 MG RE SUPP: 1.0000 | Freq: Four times a day (QID) | RECTAL | Status: DC | PRN

## 2019-12-28 MED ORDER — ONDANSETRON HCL 4 MG/2ML IJ SOLN
INTRAMUSCULAR | Status: DC | PRN
  Administered 2019-12-28: 4 mg via INTRAVENOUS

## 2019-12-28 MED ORDER — MIDAZOLAM HCL 5 MG/5ML IJ SOLN
INTRAMUSCULAR | Status: DC | PRN
  Administered 2019-12-28: 2 mg via INTRAVENOUS

## 2019-12-28 MED ORDER — LIDOCAINE 20MG/ML (2%) 15 ML SYRINGE OPTIME
INTRAMUSCULAR | Status: DC | PRN
  Administered 2019-12-28: 1.5 mg/kg/h via INTRAVENOUS

## 2019-12-28 MED ORDER — KCL IN DEXTROSE-NACL 20-5-0.45 MEQ/L-%-% IV SOLN
INTRAVENOUS | Status: DC
  Filled 2019-12-28 (×4): qty 1000

## 2019-12-28 MED ORDER — CEFAZOLIN SODIUM-DEXTROSE 2-4 GM/100ML-% IV SOLN
2.0000 g | Freq: Once | INTRAVENOUS | Status: AC
  Administered 2019-12-28: 2 g via INTRAVENOUS
  Filled 2019-12-28: qty 100

## 2019-12-28 MED ORDER — KETAMINE HCL 10 MG/ML IJ SOLN
INTRAMUSCULAR | Status: AC
  Filled 2019-12-28: qty 1

## 2019-12-28 MED ORDER — LIDOCAINE HCL (CARDIAC) PF 100 MG/5ML IV SOSY
PREFILLED_SYRINGE | INTRAVENOUS | Status: DC | PRN
  Administered 2019-12-28: 60 mg via INTRAVENOUS

## 2019-12-28 MED ORDER — ACETAMINOPHEN 325 MG PO TABS: 650.0000 mg | ORAL_TABLET | ORAL | Status: DC | PRN

## 2019-12-28 MED ORDER — SUGAMMADEX SODIUM 500 MG/5ML IV SOLN
INTRAVENOUS | Status: DC | PRN
  Administered 2019-12-28: 400 mg via INTRAVENOUS

## 2019-12-28 MED ORDER — DOCUSATE SODIUM 100 MG PO CAPS
100.0000 mg | ORAL_CAPSULE | Freq: Two times a day (BID) | ORAL | Status: DC
  Administered 2019-12-28 – 2019-12-29 (×2): 100 mg via ORAL
  Filled 2019-12-28 (×2): qty 1

## 2019-12-28 MED ORDER — MAGNESIUM CITRATE PO SOLN: 1.0000 | Freq: Once | ORAL | Status: DC

## 2019-12-28 MED ORDER — LACTATED RINGERS IV SOLN: INTRAVENOUS | Status: DC

## 2019-12-28 MED ORDER — BUPIVACAINE HCL 0.25 % IJ SOLN
INTRAMUSCULAR | Status: DC | PRN
  Administered 2019-12-28: 28 mL

## 2019-12-28 MED ORDER — FENTANYL CITRATE (PF) 250 MCG/5ML IJ SOLN
INTRAMUSCULAR | Status: DC | PRN
  Administered 2019-12-28 (×2): 100 ug via INTRAVENOUS
  Administered 2019-12-28: 50 ug via INTRAVENOUS

## 2019-12-28 MED ORDER — DEXAMETHASONE SODIUM PHOSPHATE 10 MG/ML IJ SOLN
INTRAMUSCULAR | Status: DC | PRN
  Administered 2019-12-28: 8 mg via INTRAVENOUS

## 2019-12-28 MED ORDER — HYDROMORPHONE HCL 1 MG/ML IJ SOLN: 0.2500 mg | INTRAMUSCULAR | Status: DC | PRN

## 2019-12-28 MED ORDER — FLEET ENEMA 7-19 GM/118ML RE ENEM: 1.0000 | ENEMA | Freq: Once | RECTAL | Status: DC

## 2019-12-28 MED ORDER — MORPHINE SULFATE (PF) 2 MG/ML IV SOLN: 2.0000 mg | INTRAVENOUS | Status: DC | PRN

## 2019-12-28 MED ORDER — PROPOFOL 10 MG/ML IV BOLUS
INTRAVENOUS | Status: DC | PRN
  Administered 2019-12-28: 200 mg via INTRAVENOUS

## 2019-12-28 MED ORDER — SODIUM CHLORIDE 0.9 % IR SOLN
Status: DC | PRN
  Administered 2019-12-28: 1000 mL via INTRAVESICAL

## 2019-12-28 MED ORDER — DEXAMETHASONE SODIUM PHOSPHATE 10 MG/ML IJ SOLN
INTRAMUSCULAR | Status: AC
  Filled 2019-12-28: qty 1

## 2019-12-28 MED ORDER — ROCURONIUM BROMIDE 100 MG/10ML IV SOLN
INTRAVENOUS | Status: DC | PRN
  Administered 2019-12-28 (×2): 10 mg via INTRAVENOUS
  Administered 2019-12-28: 20 mg via INTRAVENOUS
  Administered 2019-12-28: 10 mg via INTRAVENOUS
  Administered 2019-12-28: 70 mg via INTRAVENOUS
  Administered 2019-12-28 (×2): 10 mg via INTRAVENOUS

## 2019-12-28 MED ORDER — ONDANSETRON HCL 4 MG/2ML IJ SOLN
4.0000 mg | INTRAMUSCULAR | Status: DC | PRN
  Administered 2019-12-28: 4 mg via INTRAVENOUS
  Filled 2019-12-28: qty 2

## 2019-12-28 MED ORDER — HYDROMORPHONE HCL 1 MG/ML IJ SOLN
INTRAMUSCULAR | Status: DC | PRN
  Administered 2019-12-28 (×3): .5 mg via INTRAVENOUS
  Administered 2019-12-28: 1 mg via INTRAVENOUS

## 2019-12-28 SURGICAL SUPPLY — 65 items
ADH SKN CLS APL DERMABOND .7 (GAUZE/BANDAGES/DRESSINGS) ×1
APL PRP STRL LF DISP 70% ISPRP (MISCELLANEOUS) ×1
APL SWBSTK 6 STRL LF DISP (MISCELLANEOUS) ×1
APPLICATOR COTTON TIP 6 STRL (MISCELLANEOUS) ×1 IMPLANT
APPLICATOR COTTON TIP 6IN STRL (MISCELLANEOUS) ×3
CATH FOLEY 2WAY SLVR 18FR 30CC (CATHETERS) ×3 IMPLANT
CATH FOLEY 3WAY  5CC 18FR (CATHETERS)
CATH FOLEY 3WAY 5CC 18FR (CATHETERS) IMPLANT
CATH ROBINSON RED A/P 16FR (CATHETERS) ×3 IMPLANT
CATH ROBINSON RED A/P 8FR (CATHETERS) ×3 IMPLANT
CATH TIEMANN FOLEY 18FR 5CC (CATHETERS) ×3 IMPLANT
CHLORAPREP W/TINT 26 (MISCELLANEOUS) ×3 IMPLANT
CLIP VESOLOCK LG 6/CT PURPLE (CLIP) ×6 IMPLANT
COVER SURGICAL LIGHT HANDLE (MISCELLANEOUS) ×3 IMPLANT
COVER TIP SHEARS 8 DVNC (MISCELLANEOUS) ×1 IMPLANT
COVER TIP SHEARS 8MM DA VINCI (MISCELLANEOUS) ×3
COVER WAND RF STERILE (DRAPES) IMPLANT
CUTTER ECHEON FLEX ENDO 45 340 (ENDOMECHANICALS) ×3 IMPLANT
DECANTER SPIKE VIAL GLASS SM (MISCELLANEOUS) ×3 IMPLANT
DERMABOND ADVANCED (GAUZE/BANDAGES/DRESSINGS) ×2
DERMABOND ADVANCED .7 DNX12 (GAUZE/BANDAGES/DRESSINGS) ×1 IMPLANT
DRAIN CHANNEL RND F F (WOUND CARE) IMPLANT
DRAPE ARM DVNC X/XI (DISPOSABLE) ×4 IMPLANT
DRAPE COLUMN DVNC XI (DISPOSABLE) ×1 IMPLANT
DRAPE DA VINCI XI ARM (DISPOSABLE) ×12
DRAPE DA VINCI XI COLUMN (DISPOSABLE) ×3
DRAPE SURG IRRIG POUCH 19X23 (DRAPES) ×3 IMPLANT
DRSG TEGADERM 4X4.75 (GAUZE/BANDAGES/DRESSINGS) ×3 IMPLANT
ELECT PENCIL ROCKER SW 15FT (MISCELLANEOUS) ×2 IMPLANT
ELECT REM PT RETURN 15FT ADLT (MISCELLANEOUS) ×3 IMPLANT
GLOVE BIO SURGEON STRL SZ 6.5 (GLOVE) ×2 IMPLANT
GLOVE BIO SURGEONS STRL SZ 6.5 (GLOVE) ×1
GLOVE BIOGEL M STRL SZ7.5 (GLOVE) ×6 IMPLANT
GOWN STRL REUS W/TWL LRG LVL3 (GOWN DISPOSABLE) ×9 IMPLANT
HOLDER FOLEY CATH W/STRAP (MISCELLANEOUS) ×3 IMPLANT
IRRIG SUCT STRYKERFLOW 2 WTIP (MISCELLANEOUS) ×3
IRRIGATION SUCT STRKRFLW 2 WTP (MISCELLANEOUS) ×1 IMPLANT
IV LACTATED RINGERS 1000ML (IV SOLUTION) ×3 IMPLANT
KIT TURNOVER KIT A (KITS) IMPLANT
NDL SAFETY ECLIPSE 18X1.5 (NEEDLE) ×1 IMPLANT
NEEDLE HYPO 18GX1.5 SHARP (NEEDLE) ×3
PACK ROBOT UROLOGY CUSTOM (CUSTOM PROCEDURE TRAY) ×3 IMPLANT
PENCIL SMOKE EVACUATOR (MISCELLANEOUS) IMPLANT
RELOAD STAPLE 45 4.1 GRN THCK (STAPLE) ×1 IMPLANT
SEAL CANN UNIV 5-8 DVNC XI (MISCELLANEOUS) ×4 IMPLANT
SEAL XI 5MM-8MM UNIVERSAL (MISCELLANEOUS) ×12
SET TUBE SMOKE EVAC HIGH FLOW (TUBING) ×3 IMPLANT
SOLUTION ELECTROLUBE (MISCELLANEOUS) ×3 IMPLANT
STAPLE RELOAD 45 GRN (STAPLE) ×1 IMPLANT
STAPLE RELOAD 45MM GREEN (STAPLE) ×3
SUT ETHILON 3 0 PS 1 (SUTURE) ×3 IMPLANT
SUT MNCRL 3 0 RB1 (SUTURE) ×1 IMPLANT
SUT MNCRL 3 0 VIOLET RB1 (SUTURE) ×1 IMPLANT
SUT MNCRL AB 4-0 PS2 18 (SUTURE) ×6 IMPLANT
SUT MONOCRYL 3 0 RB1 (SUTURE) ×6
SUT VIC AB 0 CT1 27 (SUTURE) ×3
SUT VIC AB 0 CT1 27XBRD ANTBC (SUTURE) ×1 IMPLANT
SUT VIC AB 0 UR5 27 (SUTURE) ×3 IMPLANT
SUT VIC AB 2-0 SH 27 (SUTURE) ×3
SUT VIC AB 2-0 SH 27X BRD (SUTURE) ×1 IMPLANT
SUT VICRYL 0 UR6 27IN ABS (SUTURE) ×6 IMPLANT
SYR 27GX1/2 1ML LL SAFETY (SYRINGE) ×3 IMPLANT
TOWEL OR NON WOVEN STRL DISP B (DISPOSABLE) ×3 IMPLANT
TROCAR XCEL NON-BLD 5MMX100MML (ENDOMECHANICALS) IMPLANT
WATER STERILE IRR 1000ML POUR (IV SOLUTION) ×3 IMPLANT

## 2019-12-28 NOTE — Transfer of Care (Signed)
Immediate Anesthesia Transfer of Care Note  Patient: Christopher [name] Colon  Procedure(s) Performed: XI ROBOTIC ASSISTED LAPAROSCOPIC RADICAL PROSTATECTOMY LEVEL 2 (N/A ) LYMPHADENECTOMY (N/A )  Patient Location: PACU  Anesthesia Type:General  Level of Consciousness: drowsy, patient cooperative and responds to stimulation  Airway & Oxygen Therapy: Patient Spontanous Breathing and Patient connected to face mask oxygen  Post-op Assessment: Report given to RN and Post -op Vital signs reviewed and stable  Post vital signs: Reviewed and stable  Last Vitals:  Vitals Value Taken Time  BP    Temp    Pulse 81 12/28/19 1004  Resp 15 12/28/19 1004  SpO2 100 % 12/28/19 1004  Vitals shown include unvalidated device data.  Last Pain:  Vitals:   12/28/19 0548  TempSrc: Oral  PainSc:          Complications: No apparent anesthesia complications

## 2019-12-28 NOTE — Anesthesia Postprocedure Evaluation (Signed)
Anesthesia Post Note  Patient: Christopher [name] Colon  Procedure(s) Performed: XI ROBOTIC ASSISTED LAPAROSCOPIC RADICAL PROSTATECTOMY LEVEL 2 (N/A ) LYMPHADENECTOMY (N/A )     Patient location during evaluation: PACU Anesthesia Type: General Level of consciousness: sedated and patient cooperative Pain management: pain level controlled Vital Signs Assessment: post-procedure vital signs reviewed and stable Respiratory status: spontaneous breathing Cardiovascular status: stable Anesthetic complications: no    Last Vitals:  Vitals:   12/28/19 1115 12/28/19 1149  BP:  127/60  Pulse: 72 73  Resp: 10 20  Temp:  36.6 C  SpO2: 96% 93%    Last Pain:  Vitals:   12/28/19 1149  TempSrc: Oral  PainSc:                  [name]

## 2019-12-28 NOTE — Op Note (Signed)
Preoperative diagnosis: Clinically localized adenocarcinoma of the prostate (clinical stage T1c N0 Mx)  Postoperative diagnosis: Clinically localized adenocarcinoma of the prostate (clinical stage T1c N0 Mx)  Procedure:  1. Robotic assisted laparoscopic radical prostatectomy (left nerve sparing) 2. Bilateral robotic assisted laparoscopic pelvic lymphadenectomy  Surgeon: Pryor Curia. M.D.  Assistant(s): Debbrah Alar, PA-C  An assistant was required for this surgical procedure.  The duties of the assistant included but were not limited to suctioning, passing suture, camera manipulation, retraction. This procedure would not be able to be performed without an Environmental consultant.   Anesthesia: General  Complications: None  EBL: 50 mL  IVF:  1600 mL crystalloid  Specimens: 1. Prostate and seminal vesicles 2. Right pelvic lymph nodes 3. Left pelvic lymph nodes  Disposition of specimens: Pathology  Drains: 1. 20 Fr coude catheter 2. # 19 Blake pelvic drain  Indication: Christopher [name] Colon is a 59 y.o. patient with clinically localized prostate cancer.  After a thorough review of the management options for treatment of prostate cancer, he elected to proceed with surgical therapy and the above procedure(s).  We have discussed the potential benefits and risks of the procedure, side effects of the proposed treatment, the likelihood of the patient achieving the goals of the procedure, and any potential problems that might occur during the procedure or recuperation. Informed consent has been obtained.  Description of procedure:  The patient was taken to the operating room and a general anesthetic was administered. He was given preoperative antibiotics, placed in the dorsal lithotomy position, and prepped and draped in the usual sterile fashion. Next a preoperative timeout was performed. A urethral catheter was placed into the bladder and a site was selected near the umbilicus for placement  of the camera port. This was placed using a standard open Hassan technique which allowed entry into the peritoneal cavity under direct vision and without difficulty. An 8 mm port was placed and a pneumoperitoneum established. The camera was then used to inspect the abdomen and there was no evidence of any intra-abdominal injuries or other abnormalities. The remaining abdominal ports were then placed. 8 mm robotic ports were placed in the right lower quadrant, left lower quadrant, and far left lateral abdominal wall. A 5 mm port was placed in the right upper quadrant and a 12 mm port was placed in the right lateral abdominal wall for laparoscopic assistance. All ports were placed under direct vision without difficulty. The surgical cart was then docked.   Utilizing the cautery scissors, the bladder was reflected posteriorly allowing entry into the space of Retzius and identification of the endopelvic fascia and prostate. The periprostatic fat was then removed from the prostate allowing full exposure of the endopelvic fascia. The endopelvic fascia was then incised from the apex back to the base of the prostate bilaterally and the underlying levator muscle fibers were swept laterally off the prostate thereby isolating the dorsal venous complex. The dorsal vein was then stapled and divided with a 45 mm Flex Echelon stapler. Attention then turned to the bladder neck which was divided anteriorly thereby allowing entry into the bladder and exposure of the urethral catheter. The catheter balloon was deflated and the catheter was brought into the operative field and used to retract the prostate anteriorly. The posterior bladder neck was then examined and was divided allowing further dissection between the bladder and prostate posteriorly until the vasa deferentia and seminal vessels were identified. The vasa deferentia were isolated, divided, and lifted anteriorly. The  seminal vesicles were dissected down to their tips  with care to control the seminal vascular arterial blood supply. These structures were then lifted anteriorly and the space between Denonvillier's fascia and the anterior rectum was developed with a combination of sharp and blunt dissection. This isolated the vascular pedicles of the prostate.  The lateral prostatic fascia on the left side of the prostate was then sharply incised allowing release of the neurovascular bundle. The vascular pedicle of the prostate on the left side was then ligated with Weck clips between the prostate and neurovascular bundle and divided with sharp cold scissor dissection resulting in neurovascular bundle preservation. On the right side, a wide non nerve sparing dissection was performed with Weck clips used to ligate the vascular pedicle of the prostate. The neurovascular bundle on the left side was then separated off the apex of the prostate and urethra.   The urethra was then sharply transected allowing the prostate specimen to be disarticulated. The pelvis was copiously irrigated and hemostasis was ensured. There was no evidence for rectal injury.  Attention then turned to the right pelvic sidewall. The fibrofatty tissue between the external iliac vein, confluence of the iliac vessels, hypogastric artery, and Cooper's ligament was dissected free from the pelvic sidewall with care to preserve the obturator nerve. Weck clips were used for lymphostasis and hemostasis. An identical procedure was performed on the contralateral side and the lymphatic packets were removed for permanent pathologic analysis.  Attention then turned to the urethral anastomosis. A 2-0 Vicryl slip knot was placed between Denonvillier's fascia, the posterior bladder neck, and the posterior urethra to reapproximate these structures. A double-armed 3-0 Monocryl suture was then used to perform a 360 running tension-free anastomosis between the bladder neck and urethra. A new urethral catheter was then  placed into the bladder and irrigated. There were no blood clots within the bladder and the anastomosis appeared to be watertight. A #19 Blake drain was then brought through the left lateral 8 mm port site and positioned appropriately within the pelvis. It was secured to the skin with a nylon suture. The surgical cart was then undocked. The right lateral 12 mm port site was closed at the fascial level with a 0 Vicryl suture placed laparoscopically. All remaining ports were then removed under direct vision. The prostate specimen was removed intact within the Endopouch retrieval bag via the periumbilical camera port site. This fascial opening was closed with two running 0 Vicryl sutures. 0.25% Marcaine was then injected into all port sites and all incisions were reapproximated at the skin level with 4-0 Monocryl subcuticular sutures and Dermabond. The patient appeared to tolerate the procedure well and without complications. The patient was able to be extubated and transferred to the recovery unit in satisfactory condition.   Pryor Curia MD

## 2019-12-28 NOTE — Progress Notes (Signed)
Post-op note  Subjective: The patient is doing well.  He is complaining of dizziness.  No N/V, SOB.  Objective: Vital signs in last 24 hours: Temp:  [97.7 F (36.5 C)-98.5 F (36.9 C)] 97.8 F (36.6 C) (04/12 1149) Pulse Rate:  [52-77] 73 (04/12 1149) Resp:  [9-20] 20 (04/12 1149) BP: (127-150)/(60-78) 127/60 (04/12 1149) SpO2:  [93 %-100 %] 93 % (04/12 1149) Weight:  [100 kg] 100 kg (04/12 0545)  Intake/Output from previous day: No intake/output data recorded. Intake/Output this shift: Total I/O In: 1800 [I.V.:1700; IV Piggyback:100] Out: 380 [Urine:300; Drains:30; Blood:50]  Physical Exam:  General: Alert and oriented. Abdomen: Soft, Nondistended. Incisions: Clean and dry. Urine: yellow  Lab Results: Recent Labs    12/28/19 1023  HGB 13.5  HCT 40.5    Assessment/Plan: POD#0   1) Continue to monitor  2) DVT prophy, clears, IS, amb, pain control  3) Monitor dizziness.  Should improve as anesthesia continues to wear off and fluids are replaced. BP is currently stable.    LOS: 0 days   Debbrah Alar 12/28/2019, 3:22 PM

## 2019-12-28 NOTE — Anesthesia Procedure Notes (Signed)
Procedure Name: Intubation Date/Time: 12/28/2019 7:26 AM Performed by: Glory Buff, CRNA Pre-anesthesia Checklist: Patient identified, Emergency Drugs available, Suction available and Patient being monitored Patient Re-evaluated:Patient Re-evaluated prior to induction Oxygen Delivery Method: Circle system utilized Preoxygenation: Pre-oxygenation with 100% oxygen Induction Type: IV induction Ventilation: Mask ventilation without difficulty Laryngoscope Size: Miller and 3 Grade View: Grade II Tube type: Oral Tube size: 7.5 mm Number of attempts: 1 Airway Equipment and Method: Stylet and Oral airway Placement Confirmation: ETT inserted through vocal cords under direct vision,  positive ETCO2 and breath sounds checked- equal and bilateral Secured at: 22 cm Tube secured with: Tape Dental Injury: Teeth and Oropharynx as per pre-operative assessment

## 2019-12-29 ENCOUNTER — Encounter: Payer: Self-pay | Admitting: Medical Oncology

## 2019-12-29 DIAGNOSIS — C61 Malignant neoplasm of prostate: Secondary | ICD-10-CM | POA: Diagnosis not present

## 2019-12-29 LAB — HEMOGLOBIN AND HEMATOCRIT, BLOOD
HCT: 37.9 % — ABNORMAL LOW (ref 39.0–52.0)
Hemoglobin: 12.5 g/dL — ABNORMAL LOW (ref 13.0–17.0)

## 2019-12-29 MED ORDER — CHLORHEXIDINE GLUCONATE CLOTH 2 % EX PADS
6.0000 | MEDICATED_PAD | Freq: Every day | CUTANEOUS | Status: DC
  Administered 2019-12-29: 6 via TOPICAL

## 2019-12-29 MED ORDER — BISACODYL 10 MG RE SUPP
10.0000 mg | Freq: Once | RECTAL | Status: AC
  Administered 2019-12-29: 10:00:00 10 mg via RECTAL
  Filled 2019-12-29: qty 1

## 2019-12-29 NOTE — Progress Notes (Signed)
Pt discharged home today per Dr. Claudia Desanctis. Pt's IV site D/C'd and WDL. Pt's VSS. Pt and wife provided with home medication list, discharge instructions and prescriptions. Verbalized understanding. Pt left floor via WC in stable condition accompanied by RN.

## 2019-12-29 NOTE — Progress Notes (Signed)
Patient ID: Christopher [name], male   DOB: [birth year], 59 y.o.   MRN: [id number]  1 Day Post-Op Subjective: The patient is doing well.  No nausea or vomiting. Pain is adequately controlled except for increased irritation at tip of penis.  Objective: Vital signs in last 24 hours: Temp:  [97.7 F (36.5 C)-98.8 F (37.1 C)] 98.4 F (36.9 C) (04/13 [id number]) Pulse Rate:  [62-77] 62 (04/13 0614) Resp:  [9-20] 16 (04/13 0614) BP: (105-150)/(52-78) 107/52 (04/13 0614) SpO2:  [93 %-100 %] 98 % (04/13 0614)  Intake/Output from previous day: 04/12 0701 - 04/13 0700 In: 2748.1 [I.V.:2598.1; IV Piggyback:150] Out: [id number] [Urine:5325; Drains:140; Blood:50] Intake/Output this shift: No intake/output data recorded.  Physical Exam:  General: Alert and oriented. CV: RRR Lungs: Clear bilaterally. GI: Soft, Nondistended. Incisions: Clean, dry, and intact Urine: Clear Extremities: Nontender, no erythema, no edema.  Lab Results: Recent Labs    12/28/19 1023 12/29/19 0503  HGB 13.5 12.5*  HCT 40.5 37.9*      Assessment/Plan: POD# 1 s/p robotic prostatectomy.  1) SL IVF 2) Ambulate, Incentive spirometry 3) Transition to oral pain medication 4) Dulcolax suppository 5) D/C pelvic drain 6) Lubrication for catheter at tip of penis 7) Plan for likely discharge later today   Pryor Curia. MD   LOS: 0 days   Dutch Gray 12/29/2019, 7:34 AM

## 2019-12-29 NOTE — Discharge Instructions (Signed)

## 2019-12-29 NOTE — Discharge Summary (Signed)
  Date of admission: 12/28/2019  Date of discharge: 12/29/2019  Admission diagnosis: Prostate Cancer  Discharge diagnosis: Prostate Cancer  History and Physical: For full details, please see admission history and physical. Briefly, Christopher [name] is a 59 y.o. gentleman with localized prostate cancer.  After discussing management/treatment options, he elected to proceed with surgical treatment.  Hospital Course: Christopher [name] was taken to the operating room on 12/28/2019 and underwent a robotic assisted laparoscopic radical prostatectomy. He tolerated this procedure well and without complications. Postoperatively, he was able to be transferred to a regular hospital room following recovery from anesthesia.  He was able to begin ambulating the night of surgery. He remained hemodynamically stable overnight.  He had excellent urine output with appropriately minimal output from his pelvic drain and his pelvic drain was removed on POD #1.  He was transitioned to oral pain medication, tolerated a clear liquid diet, and had met all discharge criteria and was able to be discharged home later on POD#1.  Laboratory values:  Recent Labs    12/28/19 1023 12/29/19 0503  HGB 13.5 12.5*  HCT 40.5 37.9*    Disposition: Home  Discharge instruction: He was instructed to be ambulatory but to refrain from heavy lifting, strenuous activity, or driving. He was instructed on urethral catheter care.  Discharge medications:   Allergies as of 12/29/2019      Reactions   Asa Arthritis Strength-antacid [aspirin Buffered] Swelling   Penicillins Hives   Did it involve swelling of the face/tongue/throat, SOB, or low BP? No Did it involve sudden or severe rash/hives, skin peeling, or any reaction on the inside of your mouth or nose? No Did you need to seek medical attention at a hospital or doctor's office? No When did it last happen?childhood If all above answers are "NO", may proceed with  cephalosporin use.   Sulfa Antibiotics Hives      Medication List    TAKE these medications   ciprofloxacin 500 MG tablet Commonly known as: Cipro Take 1 tablet (500 mg total) by mouth 2 (two) times daily. Start day prior to office visit for foley removal   traMADol 50 MG tablet Commonly known as: Ultram Take 1-2 tablets (50-100 mg total) by mouth every 6 (six) hours as needed for moderate pain or severe pain.       Followup: He will followup in 1 week for catheter removal and to discuss his surgical pathology results.

## 2019-12-29 NOTE — Plan of Care (Signed)
  Problem: Education: Goal: Knowledge of the procedure and recovery process will improve Outcome: Completed/Met   Problem: Bowel/Gastric: Goal: Gastrointestinal status for postoperative course will improve Outcome: Completed/Met   Problem: Pain Management: Goal: General experience of comfort will improve Outcome: Completed/Met   Problem: Skin Integrity: Goal: Demonstration of wound healing without infection will improve Outcome: Completed/Met   Problem: Urinary Elimination: Goal: Ability to avoid or minimize complications of infection will improve Outcome: Completed/Met Goal: Ability to achieve and maintain urine output will improve Outcome: Completed/Met Goal: Home care management will improve Outcome: Completed/Met   Problem: Education: Goal: Knowledge of General Education information will improve Description: Including pain rating scale, medication(s)/side effects and non-pharmacologic comfort measures Outcome: Completed/Met   Problem: Health Behavior/Discharge Planning: Goal: Ability to manage health-related needs will improve Outcome: Completed/Met   Problem: Clinical Measurements: Goal: Ability to maintain clinical measurements within normal limits will improve Outcome: Completed/Met Goal: Will remain free from infection Outcome: Completed/Met Goal: Diagnostic test results will improve Outcome: Completed/Met Goal: Respiratory complications will improve Outcome: Completed/Met Goal: Cardiovascular complication will be avoided Outcome: Completed/Met   Problem: Activity: Goal: Risk for activity intolerance will decrease Outcome: Completed/Met   Problem: Nutrition: Goal: Adequate nutrition will be maintained Outcome: Completed/Met   Problem: Coping: Goal: Level of anxiety will decrease Outcome: Completed/Met   Problem: Elimination: Goal: Will not experience complications related to bowel motility Outcome: Completed/Met Goal: Will not experience  complications related to urinary retention Outcome: Completed/Met   Problem: Pain Managment: Goal: General experience of comfort will improve Outcome: Completed/Met   Problem: Safety: Goal: Ability to remain free from injury will improve Outcome: Completed/Met   Problem: Skin Integrity: Goal: Risk for impaired skin integrity will decrease Outcome: Completed/Met

## 2020-01-03 LAB — SURGICAL PATHOLOGY

## 2020-03-25 ENCOUNTER — Encounter: Payer: 59 | Admitting: Emergency Medicine

## 2020-03-28 ENCOUNTER — Encounter: Payer: Self-pay | Admitting: Emergency Medicine

## 2020-03-28 ENCOUNTER — Ambulatory Visit (INDEPENDENT_AMBULATORY_CARE_PROVIDER_SITE_OTHER): Payer: 59 | Admitting: Emergency Medicine

## 2020-03-28 ENCOUNTER — Other Ambulatory Visit: Payer: Self-pay

## 2020-03-28 VITALS — BP 118/67 | HR 61 | Temp 97.8°F | Resp 16 | Ht 70.0 in | Wt 227.0 lb

## 2020-03-28 DIAGNOSIS — Z0001 Encounter for general adult medical examination with abnormal findings: Secondary | ICD-10-CM | POA: Diagnosis not present

## 2020-03-28 DIAGNOSIS — Z130 Encounter for screening for diseases of the blood and blood-forming organs and certain disorders involving the immune mechanism: Secondary | ICD-10-CM | POA: Diagnosis not present

## 2020-03-28 DIAGNOSIS — Z13228 Encounter for screening for other metabolic disorders: Secondary | ICD-10-CM

## 2020-03-28 DIAGNOSIS — Z8546 Personal history of malignant neoplasm of prostate: Secondary | ICD-10-CM | POA: Diagnosis not present

## 2020-03-28 DIAGNOSIS — R7989 Other specified abnormal findings of blood chemistry: Secondary | ICD-10-CM

## 2020-03-28 DIAGNOSIS — I493 Ventricular premature depolarization: Secondary | ICD-10-CM

## 2020-03-28 DIAGNOSIS — Z13220 Encounter for screening for lipoid disorders: Secondary | ICD-10-CM

## 2020-03-28 DIAGNOSIS — Z1329 Encounter for screening for other suspected endocrine disorder: Secondary | ICD-10-CM

## 2020-03-28 DIAGNOSIS — Z862 Personal history of diseases of the blood and blood-forming organs and certain disorders involving the immune mechanism: Secondary | ICD-10-CM

## 2020-03-28 NOTE — Progress Notes (Signed)
Christopher [name] [name] 59 y.o.   Chief Complaint  Patient presents with  . Annual Exam   Christopher [name], [place]  Physician Assistant  Urology  Discharge Summary    Signed  Date of Service:  12/29/2019 12:33 PM          Signed      Expand All Collapse All   Date of admission: 12/28/2019  Date of discharge: 12/29/2019  Admission diagnosis: Prostate Cancer  Discharge diagnosis: Prostate Cancer  History and Physical: For full details, please see admission history and physical. Briefly, Christopher [name] is a 59 y.o. gentleman with localized prostate cancer.  After discussing management/treatment options, he elected to proceed with surgical treatment.  Hospital Course: Christopher [name] was taken to the operating room on 12/28/2019 and underwent a robotic assisted laparoscopic radical prostatectomy. He tolerated this procedure well and without complications. Postoperatively, he was able to be transferred to a regular hospital room following recovery from anesthesia.  He was able to begin ambulating the night of surgery. He remained hemodynamically stable overnight.  He had excellent urine output with appropriately minimal output from his pelvic drain and his pelvic drain was removed on POD #1.  He was transitioned to oral pain medication, tolerated a clear liquid diet, and had met all discharge criteria and was able to be discharged home later on POD#1.  Laboratory values:  Recent Labs (last 2 labs)       Recent Labs    12/28/19 1023 12/29/19 0503  HGB 13.5 12.5*  HCT 40.5 37.9*      Disposition: Home  Discharge instruction: He was instructed to be ambulatory but to refrain from heavy lifting, strenuous activity, or driving. He was instructed on urethral catheter care.  Discharge medications:        Allergies as of 12/29/2019      Reactions   Asa Arthritis Strength-antacid [aspirin Buffered] Swelling   Penicillins Hives   Did it involve swelling of  the face/tongue/throat, SOB, or low BP? No Did it involve sudden or severe rash/hives, skin peeling, or any reaction on the inside of your mouth or nose? No Did you need to seek medical attention at a hospital or doctor's office? No When did it last happen?childhood If all above answers are "NO", may proceed with cephalosporin use.   Sulfa Antibiotics Hives         Medication List    TAKE these medications   ciprofloxacin 500 MG tablet Commonly known as: Cipro Take 1 tablet (500 mg total) by mouth 2 (two) times daily. Start day prior to office visit for foley removal   traMADol 50 MG tablet Commonly known as: Ultram Take 1-2 tablets (50-100 mg total) by mouth every 6 (six) hours as needed for moderate pain or severe pain.       Followup: He will followup in 1 week for catheter removal and to discuss his surgical pathology results.         Cosigned by: Christopher Bring, MD at 12/30/2019 5:37 AM  Electronically signed by Christopher Alar, PA-C at 12/29/2019 12:33 PM  Electronically signed by Christopher Bring, MD at 12/30/2019 5:37 AM   HISTORY OF PRESENT ILLNESS: This is a 59 y.o. male here for annual exam. Status post radical prostatectomy last April.  Did well.  Surgical pathology showed adenocarcinoma.  Localized.  Clear margins.  Negative lymph nodes. Has history of low testosterone for a long time.  Never on supplementation. Has occasional asymptomatic PVCs.  Eating better.  Has lost some weight intentionally.  Works out regularly. Wt Readings from Last 3 Encounters:  03/28/20 227 lb (103 kg)  12/28/19 220 lb 6 oz (100 kg)  12/17/19 220 lb 6 oz (100 kg)  Fully vaccinated against Covid. No other complaints or medical concerns today.  HPI   Prior to Admission medications   Medication Sig Start Date End Date Taking? Authorizing Provider  traMADol (ULTRAM) 50 MG tablet Take 1-2 tablets (50-100 mg total) by mouth every 6 (six) hours as needed for moderate pain  or severe pain. Patient not taking: Reported on 03/28/2020 12/28/19   Christopher Alar, PA-C    Allergies  Allergen Reactions  . Asa Arthritis Strength-Antacid [Aspirin Buffered] Swelling  . Penicillins Hives    Did it involve swelling of the face/tongue/throat, SOB, or low BP? No Did it involve sudden or severe rash/hives, skin peeling, or any reaction on the inside of your mouth or nose? No Did you need to seek medical attention at a hospital or doctor's office? No When did it last happen?childhood If all above answers are "NO", may proceed with cephalosporin use.   Christopher [name] Antibiotics Hives    Patient Active Problem List   Diagnosis Date Noted  . Prostate cancer ([place]) 12/28/2019  . Malignant neoplasm of prostate ([place]) 11/17/2019  . Family history of premature CAD 01/13/2016  . Hyperlipidemia 01/13/2016  . Low testosterone in male 05/07/2014  . Hearing impaired 12/17/2013    Past Medical History:  Diagnosis Date  . Cancer ([place])    Phreesia 03/26/2020  . GERD (gastroesophageal reflux disease) 10/31/2011  . Hearing impaired 12/17/2013   lifelong   . Hypogonadism male 10/31/2011  . Prostate cancer ([place])   . PVC (premature ventricular contraction)     Past Surgical History:  Procedure Laterality Date  . LYMPHADENECTOMY N/A 12/28/2019   Procedure: LYMPHADENECTOMY;  Surgeon: Christopher Bring, MD;  Location: [facility];  Service: Urology;  Laterality: N/A;  . PROSTATE BIOPSY    . PROSTATE SURGERY N/A    Phreesia 03/26/2020  . ROBOT ASSISTED LAPAROSCOPIC RADICAL PROSTATECTOMY N/A 12/28/2019   Procedure: XI ROBOTIC ASSISTED LAPAROSCOPIC RADICAL PROSTATECTOMY LEVEL 2;  Surgeon: Christopher Bring, MD;  Location: [facility];  Service: Urology;  Laterality: N/A;  . TONSILLECTOMY      Social History   Socioeconomic History  . Marital status: Married    Spouse name: Not on file  . Number of children: 2  . Years of education: Not on file  . Highest education level: Not on file   Occupational History  . Occupation: Chemical engineer: [employer]  Tobacco Use  . Smoking status: Never Smoker  . Smokeless tobacco: Never Used  Vaping Use  . Vaping Use: Never used  Substance and Sexual Activity  . Alcohol use: No  . Drug use: No  . Sexual activity: Yes  Other Topics Concern  . Not on file  Social History Narrative   Married.  Education: Grade School.   Social Determinants of Health   Financial Resource Strain:   . Difficulty of Paying Living Expenses:   Food Insecurity:   . Worried About Charity fundraiser in the Last Year:   . Arboriculturist in the Last Year:   Transportation Needs:   . Film/video editor (Medical):   Marland Kitchen Lack of Transportation (Non-Medical):   Physical Activity:   . Days of Exercise per Week:   . Minutes of Exercise per  Session:   Stress:   . Feeling of Stress :   Social Connections:   . Frequency of Communication with Friends and Family:   . Frequency of Social Gatherings with Friends and Family:   . Attends Religious Services:   . Active Member of Clubs or Organizations:   . Attends Archivist Meetings:   Marland Kitchen Marital Status:   Intimate Partner Violence:   . Fear of Current or Ex-Partner:   . Emotionally Abused:   Marland Kitchen Physically Abused:   . Sexually Abused:     Family History  Problem Relation Age of Onset  . Hyperlipidemia Father   . Prostate cancer Father        treated with ADT and XRT  . Heart disease Mother   . Hyperlipidemia Mother   . Thyroid cancer Sister   . Breast cancer Neg Hx   . Pancreatic cancer Neg Hx   . [name] cancer Neg Hx      Review of Systems  Constitutional: Negative.  Negative for chills and fever.  HENT: Negative.  Negative for congestion and sore throat.   Eyes: Negative.   Respiratory: Negative.  Negative for cough and shortness of breath.   Cardiovascular: Positive for palpitations. Negative for chest pain, leg swelling and PND.  Gastrointestinal: Negative.   Negative for abdominal pain, blood in stool, diarrhea, melena, nausea and vomiting.  Genitourinary: Negative.  Negative for dysuria, flank pain and hematuria.  Musculoskeletal: Negative.  Negative for back pain, myalgias and neck pain.  Skin: Negative.  Negative for rash.  Neurological: Negative.  Negative for dizziness and headaches.  All other systems reviewed and are negative.  Today's Vitals   03/28/20 0814  BP: 118/67  Pulse: 61  Resp: 16  Temp: 97.8 F (36.6 C)  TempSrc: Temporal  SpO2: 97%  Weight: 227 lb (103 kg)  Height: '5\' 10"'  (1.778 m)   Body mass index is 32.57 kg/m.   Physical Exam Vitals reviewed.  Constitutional:      Appearance: Normal appearance.  HENT:     Head: Normocephalic.  Eyes:     Extraocular Movements: Extraocular movements intact.     Conjunctiva/sclera: Conjunctivae normal.     Pupils: Pupils are equal, round, and reactive to light.  Cardiovascular:     Rate and Rhythm: Normal rate and regular rhythm.     Pulses: Normal pulses.     Heart sounds: Normal heart sounds.  Pulmonary:     Effort: Pulmonary effort is normal.     Breath sounds: Normal breath sounds.  Abdominal:     General: Abdomen is flat. There is no distension.     Palpations: Abdomen is soft.     Tenderness: There is no abdominal tenderness.  Musculoskeletal:        General: No tenderness. Normal range of motion.     Cervical back: Normal range of motion and neck supple.     Right lower leg: No edema.     Left lower leg: No edema.  Skin:    General: Skin is warm and dry.     Capillary Refill: Capillary refill takes less than 2 seconds.  Neurological:     General: No focal deficit present.     Mental Status: He is alert and oriented to person, place, and time.  Psychiatric:        Mood and Affect: Mood normal.        Behavior: Behavior normal.    EKG: Normal sinus rhythm with ventricular  rate of 57/min.  No acute ischemic changes.  Normal EKG.  ASSESSMENT &  PLAN: Itay was seen today for annual exam.  Diagnoses and all orders for this visit:  Encounter for general adult medical examination with abnormal findings  PVC's (premature ventricular contractions) -     EKG 12-Lead  History of prostate cancer -     PSA  Screening for deficiency anemia  Screening for lipoid disorders -     Lipid panel  Screening for endocrine, metabolic and immunity disorder -     Comprehensive metabolic panel -     Hemoglobin A1c  History of anemia -     CBC with Differential/Platelet  Low testosterone in male -     TestT+TestF+SHBG    Patient Instructions       If you have lab work done today you will be contacted with your lab results within the next 2 weeks.  If you have not heard from [place] then please contact us. The fastest way to get your results is to register for My Chart.   IF you received an x-ray today, you will receive an invoice from [facility]. Please contact [facility] at [phone] with questions or concerns regarding your invoice.   IF you received labwork today, you will receive an invoice from [facility]. Please contact [facility] at [phone] with questions or concerns regarding your invoice.   Our billing staff will not be able to assist you with questions regarding bills from these companies.  You will be contacted with the lab results as soon as they are available. The fastest way to get your results is to activate your My Chart account. Instructions are located on the last page of this paperwork. If you have not heard from [place] regarding the results in 2 weeks, please contact this office.      Health Maintenance, Male Adopting a healthy lifestyle and getting preventive care are important in promoting health and wellness. Ask your health care provider about:  The right schedule for you to have regular tests and exams.  Things you can do on your own to prevent diseases and keep yourself  healthy. What should I know about diet, weight, and exercise? Eat a healthy diet   Eat a diet that includes plenty of vegetables, fruits, low-fat dairy products, and lean protein.  Do not eat a lot of foods that are high in solid fats, added sugars, or sodium. Maintain a healthy weight Body mass index (BMI) is a measurement that can be used to identify possible weight problems. It estimates body fat based on height and weight. Your health care provider can help determine your BMI and help you achieve or maintain a healthy weight. Get regular exercise Get regular exercise. This is one of the most important things you can do for your health. Most adults should:  Exercise for at least 150 minutes each week. The exercise should increase your heart rate and make you sweat (moderate-intensity exercise).  Do strengthening exercises at least twice a week. This is in addition to the moderate-intensity exercise.  Spend less time sitting. Even light physical activity can be beneficial. Watch cholesterol and blood lipids Have your blood tested for lipids and cholesterol at 59 years of age, then have this test every 5 years. You may need to have your cholesterol levels checked more often if:  Your lipid or cholesterol levels are high.  You are older than 59 years of age.  You are at high risk for  heart disease. What should I know about cancer screening? Many types of cancers can be detected early and may often be prevented. Depending on your health history and family history, you may need to have cancer screening at various ages. This may include screening for:  Colorectal cancer.  Prostate cancer.  Skin cancer.  Lung cancer. What should I know about heart disease, diabetes, and high blood pressure? Blood pressure and heart disease  High blood pressure causes heart disease and increases the risk of stroke. This is more likely to develop in people who have high blood pressure readings, are  of African descent, or are overweight.  Talk with your health care provider about your target blood pressure readings.  Have your blood pressure checked: ? Every 3-5 years if you are 38-81 years of age. ? Every year if you are 86 years old or older.  If you are between the ages of 74 and 3 and are a current or former smoker, ask your health care provider if you should have a one-time screening for abdominal aortic aneurysm (AAA). Diabetes Have regular diabetes screenings. This checks your fasting blood sugar level. Have the screening done:  Once every three years after age 45 if you are at a normal weight and have a low risk for diabetes.  More often and at a younger age if you are overweight or have a high risk for diabetes. What should I know about preventing infection? Hepatitis B If you have a higher risk for hepatitis B, you should be screened for this virus. Talk with your health care provider to find out if you are at risk for hepatitis B infection. Hepatitis C Blood testing is recommended for:  Everyone born from [birth year] through 1965.  Anyone with known risk factors for hepatitis C. Sexually transmitted infections (STIs)  You should be screened each year for STIs, including gonorrhea and chlamydia, if: ? You are sexually active and are younger than 59 years of age. ? You are older than 59 years of age and your health care provider tells you that you are at risk for this type of infection. ? Your sexual activity has changed since you were last screened, and you are at increased risk for chlamydia or gonorrhea. Ask your health care provider if you are at risk.  Ask your health care provider about whether you are at high risk for HIV. Your health care provider may recommend a prescription medicine to help prevent HIV infection. If you choose to take medicine to prevent HIV, you should first get tested for HIV. You should then be tested every 3 months for as long as you are taking  the medicine. Follow these instructions at home: Lifestyle  Do not use any products that contain nicotine or tobacco, such as cigarettes, e-cigarettes, and chewing tobacco. If you need help quitting, ask your health care provider.  Do not use street drugs.  Do not share needles.  Ask your health care provider for help if you need support or information about quitting drugs. Alcohol use  Do not drink alcohol if your health care provider tells you not to drink.  If you drink alcohol: ? Limit how much you have to 0-2 drinks a day. ? Be aware of how much alcohol is in your drink. In the [place] one drink equals one 12 oz bottle of beer (355 mL), one 5 oz glass of wine (148 mL), or one 1 oz glass of hard liquor (44 mL). General instructions  Schedule regular health, dental, and eye exams.  Stay current with your vaccines.  Tell your health care provider if: ? You often feel depressed. ? You have ever been abused or do not feel safe at home. Summary  Adopting a healthy lifestyle and getting preventive care are important in promoting health and wellness.  Follow your health care provider's instructions about healthy diet, exercising, and getting tested or screened for diseases.  Follow your health care provider's instructions on monitoring your cholesterol and blood pressure. This information is not intended to replace advice given to you by your health care provider. Make sure you discuss any questions you have with your health care provider. Document Revised: 08/27/2018 Document Reviewed: 08/27/2018 Elsevier Patient Education  2020 Elsevier Inc.      Agustina Caroli, MD [facility]

## 2020-03-28 NOTE — Patient Instructions (Addendum)
   If you have lab work done today you will be contacted with your lab results within the next 2 weeks.  If you have not heard from us then please contact us. The fastest way to get your results is to register for My Chart.   IF you received an x-ray today, you will receive an invoice from [facility]. Please contact [facility] at [phone] with questions or concerns regarding your invoice.   IF you received labwork today, you will receive an invoice from [facility]. Please contact [facility] at [phone] with questions or concerns regarding your invoice.   Our billing staff will not be able to assist you with questions regarding bills from these companies.  You will be contacted with the lab results as soon as they are available. The fastest way to get your results is to activate your My Chart account. Instructions are located on the last page of this paperwork. If you have not heard from us regarding the results in 2 weeks, please contact this office.      Health Maintenance, Male Adopting a healthy lifestyle and getting preventive care are important in promoting health and wellness. Ask your health care provider about:  The right schedule for you to have regular tests and exams.  Things you can do on your own to prevent diseases and keep yourself healthy. What should I know about diet, weight, and exercise? Eat a healthy diet   Eat a diet that includes plenty of vegetables, fruits, low-fat dairy products, and lean protein.  Do not eat a lot of foods that are high in solid fats, added sugars, or sodium. Maintain a healthy weight Body mass index (BMI) is a measurement that can be used to identify possible weight problems. It estimates body fat based on height and weight. Your health care provider can help determine your BMI and help you achieve or maintain a healthy weight. Get regular exercise Get regular exercise. This is one of the most important things you  can do for your health. Most adults should:  Exercise for at least 150 minutes each week. The exercise should increase your heart rate and make you sweat (moderate-intensity exercise).  Do strengthening exercises at least twice a week. This is in addition to the moderate-intensity exercise.  Spend less time sitting. Even light physical activity can be beneficial. Watch cholesterol and blood lipids Have your blood tested for lipids and cholesterol at 59 years of age, then have this test every 5 years. You may need to have your cholesterol levels checked more often if:  Your lipid or cholesterol levels are high.  You are older than 59 years of age.  You are at high risk for heart disease. What should I know about cancer screening? Many types of cancers can be detected early and may often be prevented. Depending on your health history and family history, you may need to have cancer screening at various ages. This may include screening for:  Colorectal cancer.  Prostate cancer.  Skin cancer.  Lung cancer. What should I know about heart disease, diabetes, and high blood pressure? Blood pressure and heart disease  High blood pressure causes heart disease and increases the risk of stroke. This is more likely to develop in people who have high blood pressure readings, are of African descent, or are overweight.  Talk with your health care provider about your target blood pressure readings.  Have your blood pressure checked: ? Every 3-5 years if you are 18-39   years of age. ? Every year if you are 40 years old or older.  If you are between the ages of 65 and 75 and are a current or former smoker, ask your health care provider if you should have a one-time screening for abdominal aortic aneurysm (AAA). Diabetes Have regular diabetes screenings. This checks your fasting blood sugar level. Have the screening done:  Once every three years after age 45 if you are at a normal weight and have  a low risk for diabetes.  More often and at a younger age if you are overweight or have a high risk for diabetes. What should I know about preventing infection? Hepatitis B If you have a higher risk for hepatitis B, you should be screened for this virus. Talk with your health care provider to find out if you are at risk for hepatitis B infection. Hepatitis C Blood testing is recommended for:  Everyone born from 1945 through 1965.  Anyone with known risk factors for hepatitis C. Sexually transmitted infections (STIs)  You should be screened each year for STIs, including gonorrhea and chlamydia, if: ? You are sexually active and are younger than 59 years of age. ? You are older than 59 years of age and your health care provider tells you that you are at risk for this type of infection. ? Your sexual activity has changed since you were last screened, and you are at increased risk for chlamydia or gonorrhea. Ask your health care provider if you are at risk.  Ask your health care provider about whether you are at high risk for HIV. Your health care provider may recommend a prescription medicine to help prevent HIV infection. If you choose to take medicine to prevent HIV, you should first get tested for HIV. You should then be tested every 3 months for as long as you are taking the medicine. Follow these instructions at home: Lifestyle  Do not use any products that contain nicotine or tobacco, such as cigarettes, e-cigarettes, and chewing tobacco. If you need help quitting, ask your health care provider.  Do not use street drugs.  Do not share needles.  Ask your health care provider for help if you need support or information about quitting drugs. Alcohol use  Do not drink alcohol if your health care provider tells you not to drink.  If you drink alcohol: ? Limit how much you have to 0-2 drinks a day. ? Be aware of how much alcohol is in your drink. In the [place] one drink equals one 12  oz bottle of beer (355 mL), one 5 oz glass of wine (148 mL), or one 1 oz glass of hard liquor (44 mL). General instructions  Schedule regular health, dental, and eye exams.  Stay current with your vaccines.  Tell your health care provider if: ? You often feel depressed. ? You have ever been abused or do not feel safe at home. Summary  Adopting a healthy lifestyle and getting preventive care are important in promoting health and wellness.  Follow your health care provider's instructions about healthy diet, exercising, and getting tested or screened for diseases.  Follow your health care provider's instructions on monitoring your cholesterol and blood pressure. This information is not intended to replace advice given to you by your health care provider. Make sure you discuss any questions you have with your health care provider. Document Revised: 08/27/2018 Document Reviewed: 08/27/2018 Elsevier Patient Education  2020 Elsevier Inc.  

## 2020-04-03 LAB — CBC WITH DIFFERENTIAL/PLATELET
Basophils Absolute: 0 10*3/uL (ref 0.0–0.2)
Basos: 1 %
EOS (ABSOLUTE): 0.2 10*3/uL (ref 0.0–0.4)
Eos: 3 %
Hematocrit: 41.6 % (ref 37.5–51.0)
Hemoglobin: 13.8 g/dL (ref 13.0–17.7)
Immature Grans (Abs): 0 10*3/uL (ref 0.0–0.1)
Immature Granulocytes: 0 %
Lymphocytes Absolute: 1.8 10*3/uL (ref 0.7–3.1)
Lymphs: 33 %
MCH: 29.1 pg (ref 26.6–33.0)
MCHC: 33.2 g/dL (ref 31.5–35.7)
MCV: 88 fL (ref 79–97)
Monocytes Absolute: 0.6 10*3/uL (ref 0.1–0.9)
Monocytes: 10 %
Neutrophils Absolute: 2.8 10*3/uL (ref 1.4–7.0)
Neutrophils: 53 %
Platelets: 219 10*3/uL (ref 150–450)
RBC: 4.75 x10E6/uL (ref 4.14–5.80)
RDW: 12.5 % (ref 11.6–15.4)
WBC: 5.4 10*3/uL (ref 3.4–10.8)

## 2020-04-03 LAB — COMPREHENSIVE METABOLIC PANEL
ALT: 32 IU/L (ref 0–44)
AST: 27 IU/L (ref 0–40)
Albumin/Globulin Ratio: 1.5 (ref 1.2–2.2)
Albumin: 4.1 g/dL (ref 3.8–4.9)
Alkaline Phosphatase: 74 IU/L (ref 48–121)
BUN/Creatinine Ratio: 13 (ref 9–20)
BUN: 15 mg/dL (ref 6–24)
Bilirubin Total: 0.8 mg/dL (ref 0.0–1.2)
CO2: 23 mmol/L (ref 20–29)
Calcium: 9 mg/dL (ref 8.7–10.2)
Chloride: 103 mmol/L (ref 96–106)
Creatinine, Ser: 1.19 mg/dL (ref 0.76–1.27)
GFR calc Af Amer: 77 mL/min/{1.73_m2} (ref >59)
GFR calc non Af Amer: 67 mL/min/{1.73_m2} (ref >59)
Globulin, Total: 2.7 g/dL (ref 1.5–4.5)
Glucose: 99 mg/dL (ref 65–99)
Potassium: 4.3 mmol/L (ref 3.5–5.2)
Sodium: 137 mmol/L (ref 134–144)
Total Protein: 6.8 g/dL (ref 6.0–8.5)

## 2020-04-03 LAB — LIPID PANEL
Chol/HDL Ratio: 3.5 ratio (ref 0.0–5.0)
Cholesterol, Total: 202 mg/dL — ABNORMAL HIGH (ref 100–199)
HDL: 58 mg/dL (ref >39)
LDL Chol Calc (NIH): 133 mg/dL — ABNORMAL HIGH (ref 0–99)
Triglycerides: 62 mg/dL (ref 0–149)
VLDL Cholesterol Cal: 11 mg/dL (ref 5–40)

## 2020-04-03 LAB — PSA: Prostate Specific Ag, Serum: <0.1 ng/mL (ref 0.0–4.0)

## 2020-04-03 LAB — HEMOGLOBIN A1C
Est. average glucose Bld gHb Est-mCnc: 114 mg/dL
Hgb A1c MFr Bld: 5.6 % (ref 4.8–5.6)

## 2020-04-03 LAB — TESTT+TESTF+SHBG
Sex Hormone Binding: 23.7 nmol/L (ref 19.3–76.4)
Testosterone, Free: 8.5 pg/mL (ref 7.2–24.0)
Testosterone, Total, LC/MS: 359.1 ng/dL (ref 264.0–916.0)

## 2020-08-09 ENCOUNTER — Ambulatory Visit: Payer: 59 | Admitting: Dermatology

## 2020-08-24 ENCOUNTER — Other Ambulatory Visit: Payer: Self-pay

## 2020-08-24 ENCOUNTER — Encounter: Payer: Self-pay | Admitting: Emergency Medicine

## 2020-08-24 ENCOUNTER — Ambulatory Visit (INDEPENDENT_AMBULATORY_CARE_PROVIDER_SITE_OTHER): Payer: 59 | Admitting: Emergency Medicine

## 2020-08-24 VITALS — BP 124/76 | HR 82 | Temp 97.9°F | Resp 16 | Ht 70.0 in | Wt 260.0 lb

## 2020-08-24 DIAGNOSIS — I493 Ventricular premature depolarization: Secondary | ICD-10-CM | POA: Diagnosis not present

## 2020-08-24 DIAGNOSIS — Z8546 Personal history of malignant neoplasm of prostate: Secondary | ICD-10-CM | POA: Diagnosis not present

## 2020-08-24 DIAGNOSIS — Z6837 Body mass index (BMI) 37.0-37.9, adult: Secondary | ICD-10-CM | POA: Diagnosis not present

## 2020-08-24 LAB — POCT URINALYSIS DIP (MANUAL ENTRY)
Bilirubin, UA: NEGATIVE
Glucose, UA: NEGATIVE mg/dL
Ketones, POC UA: NEGATIVE mg/dL
Leukocytes, UA: NEGATIVE
Nitrite, UA: NEGATIVE
Protein Ur, POC: NEGATIVE mg/dL
Spec Grav, UA: 1.025 (ref 1.010–1.025)
Urobilinogen, UA: 0.2 E.U./dL
pH, UA: 5.5 (ref 5.0–8.0)

## 2020-08-24 NOTE — Progress Notes (Signed)
Christopher [name] Colon 59 y.o.   Chief Complaint  Patient presents with  . Prostate anigen    per patient he is suppose to check up on     HISTORY OF PRESENT ILLNESS: This is a 59 y.o. male with history of prostate cancer status post prostatectomy last April here for follow-up. Doing well.  Has no urinary symptoms.  Requesting PSA test for follow-up. Has history of occasional PVCs.  He is aware of them but has little symptoms. Fully vaccinated against Covid. No other complaints or medical concerns.  HPI   Prior to Admission medications   Medication Sig Start Date End Date Taking? Authorizing Provider  traMADol (ULTRAM) 50 MG tablet Take 1-2 tablets (50-100 mg total) by mouth every 6 (six) hours as needed for moderate pain or severe pain. Patient not taking: Reported on 03/28/2020 12/28/19   Debbrah Alar, PA-C    Allergies  Allergen Reactions  . Asa Arthritis Strength-Antacid [Aspirin Buffered] Swelling  . Penicillins Hives    Did it involve swelling of the face/tongue/throat, SOB, or low BP? No Did it involve sudden or severe rash/hives, skin peeling, or any reaction on the inside of your mouth or nose? No Did you need to seek medical attention at a hospital or doctor's office? No When did it last happen?childhood If all above answers are "NO", may proceed with cephalosporin use.   Ignacia Bayley Antibiotics Hives    Patient Active Problem List   Diagnosis Date Noted  . Prostate cancer ([place]) 12/28/2019  . Malignant neoplasm of prostate ([place]) 11/17/2019  . Family history of premature CAD 01/13/2016  . Hyperlipidemia 01/13/2016  . Low testosterone in male 05/07/2014  . Hearing impaired 12/17/2013    Past Medical History:  Diagnosis Date  . Cancer ([place])    Phreesia 03/26/2020  . GERD (gastroesophageal reflux disease) 10/31/2011  . Hearing impaired 12/17/2013   lifelong   . Hypogonadism male 10/31/2011  . Prostate cancer ([place])   . PVC (premature ventricular  contraction)     Past Surgical History:  Procedure Laterality Date  . LYMPHADENECTOMY N/A 12/28/2019   Procedure: LYMPHADENECTOMY;  Surgeon: Raynelle Bring, MD;  Location: [facility];  Service: Urology;  Laterality: N/A;  . PROSTATE BIOPSY    . PROSTATE SURGERY N/A    Phreesia 03/26/2020  . ROBOT ASSISTED LAPAROSCOPIC RADICAL PROSTATECTOMY N/A 12/28/2019   Procedure: XI ROBOTIC ASSISTED LAPAROSCOPIC RADICAL PROSTATECTOMY LEVEL 2;  Surgeon: Raynelle Bring, MD;  Location: [facility];  Service: Urology;  Laterality: N/A;  . TONSILLECTOMY      Social History   Socioeconomic History  . Marital status: Married    Spouse name: Not on file  . Number of children: 2  . Years of education: Not on file  . Highest education level: Not on file  Occupational History  . Occupation: Chemical engineer: [employer]  Tobacco Use  . Smoking status: Never Smoker  . Smokeless tobacco: Never Used  Vaping Use  . Vaping Use: Never used  Substance and Sexual Activity  . Alcohol use: No  . Drug use: No  . Sexual activity: Yes  Other Topics Concern  . Not on file  Social History Narrative   Married.  Education: Grade School.   Social Determinants of Health   Financial Resource Strain:   . Difficulty of Paying Living Expenses: Not on file  Food Insecurity:   . Worried About Charity fundraiser in the Last Year: Not on file  .  Ran Out of Food in the Last Year: Not on file  Transportation Needs:   . Lack of Transportation (Medical): Not on file  . Lack of Transportation (Non-Medical): Not on file  Physical Activity:   . Days of Exercise per Week: Not on file  . Minutes of Exercise per Session: Not on file  Stress:   . Feeling of Stress : Not on file  Social Connections:   . Frequency of Communication with Friends and Family: Not on file  . Frequency of Social Gatherings with Friends and Family: Not on file  . Attends Religious Services: Not on file  . Active Member of Clubs or  Organizations: Not on file  . Attends Archivist Meetings: Not on file  . Marital Status: Not on file  Intimate Partner Violence:   . Fear of Current or Ex-Partner: Not on file  . Emotionally Abused: Not on file  . Physically Abused: Not on file  . Sexually Abused: Not on file    Family History  Problem Relation Age of Onset  . Hyperlipidemia Father   . Prostate cancer Father        treated with ADT and XRT  . Heart disease Mother   . Hyperlipidemia Mother   . Thyroid cancer Sister   . Breast cancer Neg Hx   . Pancreatic cancer Neg Hx   . Colon cancer Neg Hx      Review of Systems  Constitutional: Negative.  Negative for chills and fever.  HENT: Negative.  Negative for congestion and sore throat.   Respiratory: Negative.  Negative for cough and shortness of breath.   Cardiovascular: Positive for palpitations. Negative for chest pain.  Gastrointestinal: Negative.  Negative for abdominal pain, diarrhea, nausea and vomiting.  Genitourinary: Negative.  Negative for dysuria and hematuria.  Skin: Negative.  Negative for rash.  Neurological: Negative.  Negative for dizziness and headaches.  All other systems reviewed and are negative.   Today's Vitals   08/24/20 1326  BP: (!) 167/70  Pulse: 82  Resp: 16  Temp: 97.9 F (36.6 C)  TempSrc: Temporal  SpO2: 97%  Weight: 260 lb (117.9 kg)  Height: 5\' 10"  (1.778 m)   Body mass index is 37.31 kg/m. Wt Readings from Last 3 Encounters:  08/24/20 260 lb (117.9 kg)  03/28/20 227 lb (103 kg)  12/28/19 220 lb 6 oz (100 kg)   BP Readings from Last 3 Encounters:  08/24/20 (!) 167/70  03/28/20 118/67  12/29/19 (!) 107/52    Physical Exam Vitals reviewed.  Constitutional:      Appearance: Normal appearance.  HENT:     Head: Normocephalic.  Eyes:     Extraocular Movements: Extraocular movements intact.     Conjunctiva/sclera: Conjunctivae normal.     Pupils: Pupils are equal, round, and reactive to light.   Cardiovascular:     Rate and Rhythm: Normal rate and regular rhythm.     Pulses: Normal pulses.     Heart sounds: Normal heart sounds.  Pulmonary:     Effort: Pulmonary effort is normal.     Breath sounds: Normal breath sounds.  Musculoskeletal:        General: Normal range of motion.     Cervical back: Normal range of motion and neck supple.  Skin:    General: Skin is warm and dry.     Capillary Refill: Capillary refill takes less than 2 seconds.  Neurological:     General: No focal deficit present.  Mental Status: He is alert and oriented to person, place, and time.  Psychiatric:        Mood and Affect: Mood normal.        Behavior: Behavior normal.    EKG: Normal sinus rhythm with ventricular rate of 84/min.  No acute ischemic changes.  Normal EKG.  Compared with EKG done on 03/28/2020.  No changes.  ASSESSMENT & PLAN: Orien was seen today for prostate anigen.  Diagnoses and all orders for this visit:  PVC's (premature ventricular contractions) -     Lipid panel -     EKG 12-Lead -     Comprehensive metabolic panel -     CBC with Differential/Platelet  History of prostate cancer -     POCT urinalysis dipstick -     PSA  Body mass index (BMI) of 37.0-37.9 in adult    Patient Instructions       If you have lab work done today you will be contacted with your lab results within the next 2 weeks.  If you have not heard from [place] then please contact us. The fastest way to get your results is to register for My Chart.   IF you received an x-ray today, you will receive an invoice from [facility]. Please contact [facility] at [phone] with questions or concerns regarding your invoice.   IF you received labwork today, you will receive an invoice from [facility]. Please contact [facility] at [phone] with questions or concerns regarding your invoice.   Our billing staff will not be able to assist you with questions regarding bills from these  companies.  You will be contacted with the lab results as soon as they are available. The fastest way to get your results is to activate your My Chart account. Instructions are located on the last page of this paperwork. If you have not heard from [place] regarding the results in 2 weeks, please contact this office.     Health Maintenance, Male Adopting a healthy lifestyle and getting preventive care are important in promoting health and wellness. Ask your health care provider about:  The right schedule for you to have regular tests and exams.  Things you can do on your own to prevent diseases and keep yourself healthy. What should I know about diet, weight, and exercise? Eat a healthy diet   Eat a diet that includes plenty of vegetables, fruits, low-fat dairy products, and lean protein.  Do not eat a lot of foods that are high in solid fats, added sugars, or sodium. Maintain a healthy weight Body mass index (BMI) is a measurement that can be used to identify possible weight problems. It estimates body fat based on height and weight. Your health care provider can help determine your BMI and help you achieve or maintain a healthy weight. Get regular exercise Get regular exercise. This is one of the most important things you can do for your health. Most adults should:  Exercise for at least 150 minutes each week. The exercise should increase your heart rate and make you sweat (moderate-intensity exercise).  Do strengthening exercises at least twice a week. This is in addition to the moderate-intensity exercise.  Spend less time sitting. Even light physical activity can be beneficial. Watch cholesterol and blood lipids Have your blood tested for lipids and cholesterol at 59 years of age, then have this test every 5 years. You may need to have your cholesterol levels checked more often if:  Your lipid or  cholesterol levels are high.  You are older than 59 years of age.  You are at high  risk for heart disease. What should I know about cancer screening? Many types of cancers can be detected early and may often be prevented. Depending on your health history and family history, you may need to have cancer screening at various ages. This may include screening for:  Colorectal cancer.  Prostate cancer.  Skin cancer.  Lung cancer. What should I know about heart disease, diabetes, and high blood pressure? Blood pressure and heart disease  High blood pressure causes heart disease and increases the risk of stroke. This is more likely to develop in people who have high blood pressure readings, are of African descent, or are overweight.  Talk with your health care provider about your target blood pressure readings.  Have your blood pressure checked: ? Every 3-5 years if you are 42-98 years of age. ? Every year if you are 59 years old or older.  If you are between the ages of 67 and 15 and are a current or former smoker, ask your health care provider if you should have a one-time screening for abdominal aortic aneurysm (AAA). Diabetes Have regular diabetes screenings. This checks your fasting blood sugar level. Have the screening done:  Once every three years after age 27 if you are at a normal weight and have a low risk for diabetes.  More often and at a younger age if you are overweight or have a high risk for diabetes. What should I know about preventing infection? Hepatitis B If you have a higher risk for hepatitis B, you should be screened for this virus. Talk with your health care provider to find out if you are at risk for hepatitis B infection. Hepatitis C Blood testing is recommended for:  Everyone born from [birth year] through 1965.  Anyone with known risk factors for hepatitis C. Sexually transmitted infections (STIs)  You should be screened each year for STIs, including gonorrhea and chlamydia, if: ? You are sexually active and are younger than 59 years of  age. ? You are older than 59 years of age and your health care provider tells you that you are at risk for this type of infection. ? Your sexual activity has changed since you were last screened, and you are at increased risk for chlamydia or gonorrhea. Ask your health care provider if you are at risk.  Ask your health care provider about whether you are at high risk for HIV. Your health care provider may recommend a prescription medicine to help prevent HIV infection. If you choose to take medicine to prevent HIV, you should first get tested for HIV. You should then be tested every 3 months for as long as you are taking the medicine. Follow these instructions at home: Lifestyle  Do not use any products that contain nicotine or tobacco, such as cigarettes, e-cigarettes, and chewing tobacco. If you need help quitting, ask your health care provider.  Do not use street drugs.  Do not share needles.  Ask your health care provider for help if you need support or information about quitting drugs. Alcohol use  Do not drink alcohol if your health care provider tells you not to drink.  If you drink alcohol: ? Limit how much you have to 0-2 drinks a day. ? Be aware of how much alcohol is in your drink. In the [place] one drink equals one 12 oz bottle of beer (355 mL), one  5 oz glass of wine (148 mL), or one 1 oz glass of hard liquor (44 mL). General instructions  Schedule regular health, dental, and eye exams.  Stay current with your vaccines.  Tell your health care provider if: ? You often feel depressed. ? You have ever been abused or do not feel safe at home. Summary  Adopting a healthy lifestyle and getting preventive care are important in promoting health and wellness.  Follow your health care provider's instructions about healthy diet, exercising, and getting tested or screened for diseases.  Follow your health care provider's instructions on monitoring your cholesterol and blood  pressure. This information is not intended to replace advice given to you by your health care provider. Make sure you discuss any questions you have with your health care provider. Document Revised: 08/27/2018 Document Reviewed: 08/27/2018 Elsevier Patient Education  2020 Elsevier Inc.      Agustina Caroli, MD [facility]

## 2020-08-24 NOTE — Patient Instructions (Addendum)
   If you have lab work done today you will be contacted with your lab results within the next 2 weeks.  If you have not heard from us then please contact us. The fastest way to get your results is to register for My Chart.   IF you received an x-ray today, you will receive an invoice from [facility]. Please contact  Radiology at [phone] with questions or concerns regarding your invoice.   IF you received labwork today, you will receive an invoice from [facility]. Please contact [facility] at [phone] with questions or concerns regarding your invoice.   Our billing staff will not be able to assist you with questions regarding bills from these companies.  You will be contacted with the lab results as soon as they are available. The fastest way to get your results is to activate your My Chart account. Instructions are located on the last page of this paperwork. If you have not heard from us regarding the results in 2 weeks, please contact this office.      Health Maintenance, Male Adopting a healthy lifestyle and getting preventive care are important in promoting health and wellness. Ask your health care provider about:  The right schedule for you to have regular tests and exams.  Things you can do on your own to prevent diseases and keep yourself healthy. What should I know about diet, weight, and exercise? Eat a healthy diet   Eat a diet that includes plenty of vegetables, fruits, low-fat dairy products, and lean protein.  Do not eat a lot of foods that are high in solid fats, added sugars, or sodium. Maintain a healthy weight Body mass index (BMI) is a measurement that can be used to identify possible weight problems. It estimates body fat based on height and weight. Your health care provider can help determine your BMI and help you achieve or maintain a healthy weight. Get regular exercise Get regular exercise. This is one of the most important things you  can do for your health. Most adults should:  Exercise for at least 150 minutes each week. The exercise should increase your heart rate and make you sweat (moderate-intensity exercise).  Do strengthening exercises at least twice a week. This is in addition to the moderate-intensity exercise.  Spend less time sitting. Even light physical activity can be beneficial. Watch cholesterol and blood lipids Have your blood tested for lipids and cholesterol at 59 years of age, then have this test every 5 years. You may need to have your cholesterol levels checked more often if:  Your lipid or cholesterol levels are high.  You are older than 59 years of age.  You are at high risk for heart disease. What should I know about cancer screening? Many types of cancers can be detected early and may often be prevented. Depending on your health history and family history, you may need to have cancer screening at various ages. This may include screening for:  Colorectal cancer.  Prostate cancer.  Skin cancer.  Lung cancer. What should I know about heart disease, diabetes, and high blood pressure? Blood pressure and heart disease  High blood pressure causes heart disease and increases the risk of stroke. This is more likely to develop in people who have high blood pressure readings, are of African descent, or are overweight.  Talk with your health care provider about your target blood pressure readings.  Have your blood pressure checked: ? Every 3-5 years if you are 18-39   years of age. ? Every year if you are 40 years old or older.  If you are between the ages of 65 and 75 and are a current or former smoker, ask your health care provider if you should have a one-time screening for abdominal aortic aneurysm (AAA). Diabetes Have regular diabetes screenings. This checks your fasting blood sugar level. Have the screening done:  Once every three years after age 45 if you are at a normal weight and have  a low risk for diabetes.  More often and at a younger age if you are overweight or have a high risk for diabetes. What should I know about preventing infection? Hepatitis B If you have a higher risk for hepatitis B, you should be screened for this virus. Talk with your health care provider to find out if you are at risk for hepatitis B infection. Hepatitis C Blood testing is recommended for:  Everyone born from 1945 through 1965.  Anyone with known risk factors for hepatitis C. Sexually transmitted infections (STIs)  You should be screened each year for STIs, including gonorrhea and chlamydia, if: ? You are sexually active and are younger than 59 years of age. ? You are older than 59 years of age and your health care provider tells you that you are at risk for this type of infection. ? Your sexual activity has changed since you were last screened, and you are at increased risk for chlamydia or gonorrhea. Ask your health care provider if you are at risk.  Ask your health care provider about whether you are at high risk for HIV. Your health care provider may recommend a prescription medicine to help prevent HIV infection. If you choose to take medicine to prevent HIV, you should first get tested for HIV. You should then be tested every 3 months for as long as you are taking the medicine. Follow these instructions at home: Lifestyle  Do not use any products that contain nicotine or tobacco, such as cigarettes, e-cigarettes, and chewing tobacco. If you need help quitting, ask your health care provider.  Do not use street drugs.  Do not share needles.  Ask your health care provider for help if you need support or information about quitting drugs. Alcohol use  Do not drink alcohol if your health care provider tells you not to drink.  If you drink alcohol: ? Limit how much you have to 0-2 drinks a day. ? Be aware of how much alcohol is in your drink. In the [place] one drink equals one 12  oz bottle of beer (355 mL), one 5 oz glass of wine (148 mL), or one 1 oz glass of hard liquor (44 mL). General instructions  Schedule regular health, dental, and eye exams.  Stay current with your vaccines.  Tell your health care provider if: ? You often feel depressed. ? You have ever been abused or do not feel safe at home. Summary  Adopting a healthy lifestyle and getting preventive care are important in promoting health and wellness.  Follow your health care provider's instructions about healthy diet, exercising, and getting tested or screened for diseases.  Follow your health care provider's instructions on monitoring your cholesterol and blood pressure. This information is not intended to replace advice given to you by your health care provider. Make sure you discuss any questions you have with your health care provider. Document Revised: 08/27/2018 Document Reviewed: 08/27/2018 Elsevier Patient Education  2020 Elsevier Inc.  

## 2020-08-25 ENCOUNTER — Other Ambulatory Visit: Payer: Self-pay | Admitting: Emergency Medicine

## 2020-08-25 DIAGNOSIS — E785 Hyperlipidemia, unspecified: Secondary | ICD-10-CM

## 2020-08-25 LAB — CBC WITH DIFFERENTIAL/PLATELET
Basophils Absolute: 0 10*3/uL (ref 0.0–0.2)
Basos: 0 %
EOS (ABSOLUTE): 0.1 10*3/uL (ref 0.0–0.4)
Eos: 2 %
Hematocrit: 42.7 % (ref 37.5–51.0)
Hemoglobin: 14.4 g/dL (ref 13.0–17.7)
Immature Grans (Abs): 0 10*3/uL (ref 0.0–0.1)
Immature Granulocytes: 0 %
Lymphocytes Absolute: 2.1 10*3/uL (ref 0.7–3.1)
Lymphs: 26 %
MCH: 29.2 pg (ref 26.6–33.0)
MCHC: 33.7 g/dL (ref 31.5–35.7)
MCV: 87 fL (ref 79–97)
Monocytes Absolute: 0.7 10*3/uL (ref 0.1–0.9)
Monocytes: 9 %
Neutrophils Absolute: 5.1 10*3/uL (ref 1.4–7.0)
Neutrophils: 63 %
Platelets: 272 10*3/uL (ref 150–450)
RBC: 4.93 x10E6/uL (ref 4.14–5.80)
RDW: 12.9 % (ref 11.6–15.4)
WBC: 8.1 10*3/uL (ref 3.4–10.8)

## 2020-08-25 LAB — COMPREHENSIVE METABOLIC PANEL
ALT: 36 IU/L (ref 0–44)
AST: 27 IU/L (ref 0–40)
Albumin/Globulin Ratio: 1.3 (ref 1.2–2.2)
Albumin: 4.2 g/dL (ref 3.8–4.9)
Alkaline Phosphatase: 89 IU/L (ref 44–121)
BUN/Creatinine Ratio: 13 (ref 9–20)
BUN: 15 mg/dL (ref 6–24)
Bilirubin Total: 0.6 mg/dL (ref 0.0–1.2)
CO2: 25 mmol/L (ref 20–29)
Calcium: 9.5 mg/dL (ref 8.7–10.2)
Chloride: 102 mmol/L (ref 96–106)
Creatinine, Ser: 1.18 mg/dL (ref 0.76–1.27)
GFR calc Af Amer: 78 mL/min/{1.73_m2} (ref >59)
GFR calc non Af Amer: 67 mL/min/{1.73_m2} (ref >59)
Globulin, Total: 3.2 g/dL (ref 1.5–4.5)
Glucose: 124 mg/dL — ABNORMAL HIGH (ref 65–99)
Potassium: 4.4 mmol/L (ref 3.5–5.2)
Sodium: 139 mmol/L (ref 134–144)
Total Protein: 7.4 g/dL (ref 6.0–8.5)

## 2020-08-25 LAB — LIPID PANEL
Chol/HDL Ratio: 5.7 ratio — ABNORMAL HIGH (ref 0.0–5.0)
Cholesterol, Total: 234 mg/dL — ABNORMAL HIGH (ref 100–199)
HDL: 41 mg/dL (ref >39)
LDL Chol Calc (NIH): 162 mg/dL — ABNORMAL HIGH (ref 0–99)
Triglycerides: 169 mg/dL — ABNORMAL HIGH (ref 0–149)
VLDL Cholesterol Cal: 31 mg/dL (ref 5–40)

## 2020-08-25 LAB — PSA: Prostate Specific Ag, Serum: <0.1 ng/mL (ref 0.0–4.0)

## 2020-08-25 MED ORDER — ROSUVASTATIN CALCIUM 10 MG PO TABS: 10.0000 mg | ORAL_TABLET | Freq: Every day | ORAL | 3 refills | Status: DC

## 2020-09-19 ENCOUNTER — Ambulatory Visit: Payer: 59 | Admitting: Cardiology

## 2020-09-19 ENCOUNTER — Ambulatory Visit: Payer: 59 | Admitting: Emergency Medicine

## 2020-09-20 ENCOUNTER — Ambulatory Visit: Payer: 59 | Admitting: Dermatology

## 2020-09-20 ENCOUNTER — Other Ambulatory Visit: Payer: Self-pay

## 2020-09-20 ENCOUNTER — Encounter: Payer: Self-pay | Admitting: Dermatology

## 2020-09-20 DIAGNOSIS — L821 Other seborrheic keratosis: Secondary | ICD-10-CM

## 2020-09-20 DIAGNOSIS — Z1283 Encounter for screening for malignant neoplasm of skin: Secondary | ICD-10-CM | POA: Diagnosis not present

## 2020-09-20 DIAGNOSIS — L719 Rosacea, unspecified: Secondary | ICD-10-CM | POA: Diagnosis not present

## 2020-09-20 MED ORDER — METRONIDAZOLE 0.75 % EX GEL: 1.0000 "application " | Freq: Two times a day (BID) | CUTANEOUS | 0 refills | Status: DC

## 2020-09-26 ENCOUNTER — Encounter: Payer: Self-pay | Admitting: Dermatology

## 2020-09-26 NOTE — Progress Notes (Signed)
   Follow-Up Visit   Subjective  Christopher [name] is a 60 y.o. male who presents for the following: Annual Exam (Patient here today for yearly skin check. Per patient he is still healing with Rosacea no current treatment. Patient states that he has surgery last year and he'd like the scar looked at. Recheck dark spot on right foot and right forehead.).  General skin check Location:  Duration:  Quality:  Associated Signs/Symptoms: Modifying Factors:  Severity:  Timing: Context:   Objective  Well appearing patient in no apparent distress; mood and affect are within normal limits. Objective  Head - Anterior (Face): 2 mm monochrome macule which is historically stable on foot.  Dermoscopy shows benign lentigo.  No other atypical pigmented lesions feet to scalp.  Scars on abdomen from prostate surgery show mild hyperpigmentation but require no intervention.  Objective  Head - Anterior (Face): Four millimeter light brown textured flattopped papule  Objective  Head - Anterior (Face): Moderately inflamed central facial edematous red papules   A full examination was performed including scalp, head, eyes, ears, nose, lips, neck, chest, axillae, abdomen, back, buttocks, bilateral upper extremities, bilateral lower extremities, hands, feet, fingers, toes, fingernails, and toenails. All findings within normal limits unless otherwise noted below.   Assessment & Plan    Skin exam for malignant neoplasm Head - Anterior (Face)  Yearly skin check  Seborrheic keratosis Head - Anterior (Face)  Leave if stable  Rosacea Head - Anterior (Face)  We discussed multiple therapies for rosacea along with the secondary role that the Covid mask may play.  Mr. [name] is pleased with his general response to topical metronidazole and request to continue this.  Ordered Medications: metroNIDAZOLE (METROGEL) 0.75 % gel     I, Lavonna Monarch, MD, have reviewed all documentation for this visit.   The documentation on 09/26/20 for the exam, diagnosis, procedures, and orders are all accurate and complete.

## 2021-01-26 ENCOUNTER — Ambulatory Visit: Payer: 59 | Admitting: Cardiology

## 2021-02-09 NOTE — Progress Notes (Deleted)
Cardiology Office Note:    Date:  02/09/2021   ID:  Christopher [name], DOB [birth year], MRN [id number]  PCP:  Horald Pollen, [place] [facility] Cardiologist:  Ena Dawley, MD (Inactive) {  Referring MD: Horald Pollen, *    History of Present Illness:    Christopher [name] is a 60 y.o. male with a hx of prostate cancer and GERD who was previously followed by Dr. Meda Coffee who now returns to clinic for follow-up.  Initially saw Dr. Meda Coffee for palpitations, SOB and family history of CAD. Holter in 2017 showed rare PVCs and infrequent PACs. TTE 01/2016 with LVEF 60-65%, no WMA, no significant valve disease. Coronary CTA in 2019 with no significant CAD, Ca score 0.  Last saw Dr. Meda Coffee on 09/14/19 where he was doing very well. Had lost weight.  Today,  Past Medical History:  Diagnosis Date  . Cancer ([place])    Phreesia 03/26/2020  . GERD (gastroesophageal reflux disease) 10/31/2011  . Hearing impaired 12/17/2013   lifelong   . Hypogonadism male 10/31/2011  . Prostate cancer ([place])   . PVC (premature ventricular contraction)     Past Surgical History:  Procedure Laterality Date  . LYMPHADENECTOMY N/A 12/28/2019   Procedure: LYMPHADENECTOMY;  Surgeon: Raynelle Bring, MD;  Location: [facility];  Service: Urology;  Laterality: N/A;  . PROSTATE BIOPSY    . PROSTATE SURGERY N/A    Phreesia 03/26/2020  . ROBOT ASSISTED LAPAROSCOPIC RADICAL PROSTATECTOMY N/A 12/28/2019   Procedure: XI ROBOTIC ASSISTED LAPAROSCOPIC RADICAL PROSTATECTOMY LEVEL 2;  Surgeon: Raynelle Bring, MD;  Location: [facility];  Service: Urology;  Laterality: N/A;  . TONSILLECTOMY      Current Medications: No outpatient medications have been marked as taking for the 02/10/21 encounter (Appointment) with Freada Bergeron, MD.     Allergies:   Asa arthritis strength-antacid [aspirin buffered], Penicillins, and Sulfa antibiotics   Social History   Socioeconomic History  . Marital status:  Married    Spouse name: Not on file  . Number of children: 2  . Years of education: Not on file  . Highest education level: Not on file  Occupational History  . Occupation: Chemical engineer: [employer]  Tobacco Use  . Smoking status: Never Smoker  . Smokeless tobacco: Never Used  Vaping Use  . Vaping Use: Never used  Substance and Sexual Activity  . Alcohol use: No  . Drug use: No  . Sexual activity: Yes  Other Topics Concern  . Not on file  Social History Narrative   Married.  Education: Grade School.   Social Determinants of Health   Financial Resource Strain: Not on file  Food Insecurity: Not on file  Transportation Needs: Not on file  Physical Activity: Not on file  Stress: Not on file  Social Connections: Not on file     Family History: The patient's ***family history includes Heart disease in his mother; Hyperlipidemia in his father and mother; Prostate cancer in his father; Thyroid cancer in his sister. There is no history of Breast cancer, Pancreatic cancer, or [name] cancer.  ROS:   Please see the history of present illness.    *** All other systems reviewed and are negative.  EKGs/Labs/Other Studies Reviewed:    The following studies were reviewed today:  ETT: 02/2014 ETT Interpretation:  normal - no evidence of ischemia by ST analysis Comments: Patient presents today for routine GXT. Has strong FH for CAD -  he is obese. Has had some dyspnea and palpitations.   Today the patient exercised on the standard Bruce protocol for a total of 9 minutes.  Good exercise tolerance.  Adequate blood pressure response.  Clinically negative for chest pain. Test was stopped due to achievement of target HR.  EKG negative for ischemia. No significant arrhythmia noted.   48-Holter monitor  Rare PVCs, infrequent PACs.  No arrhythmias or significant pauses identified. Normal Holter monitor, no therapy necessary.  Accessory Clinical  Findings  Echocardiogram - 2009,Overall left ventricular systolic function was normal. Left ventricular ejection fraction was estimated to be 60 %. There were no left ventricular regional wall motion abnormalities. - Estimated peak pulmonary artery systolic pressure 24 mmHg.  TTE: 01/25/2016 - Left ventricle: The cavity size was normal. Wall thickness was  normal. Systolic function was normal. The estimated ejection  fraction was in the range of 60% to 65%. Wall motion was normal;  there were no regional wall motion abnormalities. Left  ventricular diastolic function parameters were normal. - Mitral valve: Mildly thickened leaflets . There was trivial  regurgitation. - Left atrium: The atrium was normal in size. - Right atrium: The atrium was mildly dilated. - Inferior vena cava: The vessel was normal in size. The  respirophasic diameter changes were in the normal range (>= 50%),  consistent with normal central venous pressure.  Impressions: - LVEF 60-65%, normal wall thickness, normal wall motion, normal  diastolic function, normal LA size, mildly dilated RA, no  significant TR, normal IVC.    CCTA: 06/2018  1. Coronary calcium score of 0. This was 0 percentile for age and sex matched control.  2. Normal coronary origin with right dominance.  3. No evidence of CAD.  4. 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   EKG:  EKG is *** ordered today.  The ekg ordered today demonstrates ***  Recent Labs: 08/24/2020: ALT 36; BUN 15; Creatinine, Ser 1.18; Hemoglobin 14.4; Platelets 272; Potassium 4.4; Sodium 139  Recent Lipid Panel    Component Value Date/Time   CHOL 234 (H) 08/24/2020 1502   TRIG 169 (H) 08/24/2020 1502   HDL 41 08/24/2020 1502   CHOLHDL 5.7 (H) 08/24/2020 1502   CHOLHDL 3.9 02/20/2016 1001   VLDL 13 02/20/2016 1001   LDLCALC 162 (H) 08/24/2020 1502     Risk Assessment/Calculations:   {Does this patient have ATRIAL  FIBRILLATION?[phone]}   Physical Exam:    VS:  There were no vitals taken for this visit.    Wt Readings from Last 3 Encounters:  08/24/20 260 lb (117.9 kg)  03/28/20 227 lb (103 kg)  12/28/19 220 lb 6 oz (100 kg)     GEN: *** Well nourished, well developed in no acute distress HEENT: Normal NECK: No JVD; No carotid bruits LYMPHATICS: No lymphadenopathy CARDIAC: ***RRR, no murmurs, rubs, gallops RESPIRATORY:  Clear to auscultation without rales, wheezing or rhonchi  ABDOMEN: Soft, non-tender, non-distended MUSCULOSKELETAL:  No edema; No deformity  SKIN: Warm and dry NEUROLOGIC:  Alert and oriented x 3 PSYCHIATRIC:  Normal affect   ASSESSMENT:    No diagnosis found. PLAN:    In order of problems listed above:  #Noncardiac Chest Pain: Coronary CTA negative for obstructive disease with Ca score 0. TTE with normal LVEF, no valve disease. -Continue lifestyle modifications -Continue crestor 10mg  daily  #Palpitations: Rare PVCs and PACs, no significant arrthyhmias. Overall improved.  #HLD: Ca score 0 in 2019 with no significant disease on coronary CTA.  -  Continue crestor 10mg  daily   {Are you ordering a CV Procedure (e.g. stress test, cath, DCCV, TEE, etc)?   Press F2        :[id number]}    Medication Adjustments/Labs and Tests Ordered: Current medicines are reviewed at length with the patient today.  Concerns regarding medicines are outlined above.  No orders of the defined types were placed in this encounter.  No orders of the defined types were placed in this encounter.   There are no Patient Instructions on file for this visit.   Signed, Freada Bergeron, MD  02/09/2021 6:13 PM    [facility]

## 2021-02-10 ENCOUNTER — Ambulatory Visit: Payer: 59 | Admitting: Cardiology

## 2021-02-10 ENCOUNTER — Other Ambulatory Visit: Payer: Self-pay

## 2021-02-10 ENCOUNTER — Encounter: Payer: Self-pay | Admitting: Cardiology

## 2021-02-10 VITALS — BP 148/80 | HR 76 | Ht 70.0 in | Wt 267.8 lb

## 2021-02-10 DIAGNOSIS — R079 Chest pain, unspecified: Secondary | ICD-10-CM

## 2021-02-10 DIAGNOSIS — E669 Obesity, unspecified: Secondary | ICD-10-CM | POA: Diagnosis not present

## 2021-02-10 DIAGNOSIS — I10 Essential (primary) hypertension: Secondary | ICD-10-CM | POA: Diagnosis not present

## 2021-02-10 DIAGNOSIS — E785 Hyperlipidemia, unspecified: Secondary | ICD-10-CM | POA: Diagnosis not present

## 2021-02-10 DIAGNOSIS — R002 Palpitations: Secondary | ICD-10-CM

## 2021-02-10 DIAGNOSIS — Z8249 Family history of ischemic heart disease and other diseases of the circulatory system: Secondary | ICD-10-CM

## 2021-02-10 NOTE — Patient Instructions (Addendum)
Medication Instructions:  Your physician recommends that you continue on your current medications as directed. Please refer to the Current Medication list given to you today.  *If you need a refill on your cardiac medications before your next appointment, please call your pharmacy*   Lab Work: Lipid panel in 3 mths at f/u visit (you will need to be fasting for this lab) If you have labs (blood work) drawn today and your tests are completely normal, you will receive your results only by: Marland Kitchen MyChart Message (if you have MyChart) OR . A paper copy in the mail If you have any lab test that is abnormal or we need to change your treatment, we will call you to review the results.   Testing/Procedures: None   Follow-Up: At [facility] ([place] you and your health needs are our priority.  As part of our continuing mission to provide you with exceptional heart care, we have created designated Provider Care Teams.  These Care Teams include your primary Cardiologist (physician) and Advanced Practice Providers (APPs -  Physician Assistants and Nurse Practitioners) who all work together to provide you with the care you need, when you need it.  We recommend signing up for the patient portal called "MyChart".  Sign up information is provided on this After Visit Summary.  MyChart is used to connect with patients for Virtual Visits (Telemedicine).  Patients are able to view lab/test results, encounter notes, upcoming appointments, etc.  Non-urgent messages can be sent to your provider as well.   To learn more about what you can do with MyChart, go to [email or web address].    Your next appointment:   3 month(s)  The format for your next appointment:   In Person  Provider:   You may see Dr. Johney Frame or one of the following Advanced Practice Providers on your designated Care Team:    Richardson Dopp, PA-C  [place], [place]    Other Instructions Mediterranean Diet A Mediterranean diet refers to food  and lifestyle choices that are based on the traditions of countries located on the [employer]. This way of eating has been shown to help prevent certain conditions and improve outcomes for people who have chronic diseases, like kidney disease and heart disease. What are tips for following this plan? Lifestyle  Cook and eat meals together with your family, when possible.  Drink enough fluid to keep your urine clear or pale yellow.  Be physically active every day. This includes: ? Aerobic exercise like running or swimming. ? Leisure activities like gardening, walking, or housework.  Get 7-8 hours of sleep each night.  If recommended by your health care provider, drink red wine in moderation. This means 1 glass a day for nonpregnant women and 2 glasses a day for men. A glass of wine equals 5 oz (150 mL). Reading food labels  Check the serving size of packaged foods. For foods such as rice and pasta, the serving size refers to the amount of cooked product, not dry.  Check the total fat in packaged foods. Avoid foods that have saturated fat or trans fats.  Check the ingredients list for added sugars, such as corn syrup.   Shopping  At the grocery store, buy most of your food from the areas near the walls of the store. This includes: ? Fresh fruits and vegetables (produce). ? Grains, beans, nuts, and seeds. Some of these may be available in unpackaged forms or large amounts (in bulk). ? Fresh seafood. ? Poultry and eggs. ?  Low-fat dairy products.  Buy whole ingredients instead of prepackaged foods.  Buy fresh fruits and vegetables in-season from local farmers markets.  Buy frozen fruits and vegetables in resealable bags.  If you do not have access to quality fresh seafood, buy precooked frozen shrimp or canned fish, such as tuna, salmon, or sardines.  Buy small amounts of raw or cooked vegetables, salads, or olives from the deli or salad bar at your store.  Stock your pantry  so you always have certain foods on hand, such as olive oil, canned tuna, canned tomatoes, rice, pasta, and beans. Cooking  Cook foods with extra-virgin olive oil instead of using butter or other vegetable oils.  Have meat as a side dish, and have vegetables or grains as your main dish. This means having meat in small portions or adding small amounts of meat to foods like pasta or stew.  Use beans or vegetables instead of meat in common dishes like chili or lasagna.  Experiment with different cooking methods. Try roasting or broiling vegetables instead of steaming or sauteing them.  Add frozen vegetables to soups, stews, pasta, or rice.  Add nuts or seeds for added healthy fat at each meal. You can add these to yogurt, salads, or vegetable dishes.  Marinate fish or vegetables using olive oil, lemon juice, garlic, and fresh herbs. Meal planning  Plan to eat 1 vegetarian meal one day each week. Try to work up to 2 vegetarian meals, if possible.  Eat seafood 2 or more times a week.  Have healthy snacks readily available, such as: ? Vegetable sticks with hummus. ? [place] yogurt. ? Fruit and nut trail mix.  Eat balanced meals throughout the week. This includes: ? Fruit: 2-3 servings a day ? Vegetables: 4-5 servings a day ? Low-fat dairy: 2 servings a day ? Fish, poultry, or lean meat: 1 serving a day ? Beans and legumes: 2 or more servings a week ? Nuts and seeds: 1-2 servings a day ? Whole grains: 6-8 servings a day ? Extra-virgin olive oil: 3-4 servings a day  Limit red meat and sweets to only a few servings a month   What are my food choices?  Mediterranean diet ? Recommended  Grains: Whole-grain pasta. Brown rice. Bulgar wheat. Polenta. Couscous. Whole-wheat bread. [place].  Vegetables: Artichokes. Beets. Broccoli. Cabbage. Carrots. Eggplant. Green beans. Chard. Kale. Spinach. Onions. Leeks. Peas. Squash. Tomatoes. Peppers. Radishes.  Fruits: Apples. Apricots.  Avocado. Berries. Bananas. Cherries. Dates. Figs. Grapes. Lemons. Melon. Oranges. Peaches. Plums. Pomegranate.  Meats and other protein foods: Beans. Almonds. Sunflower seeds. Pine nuts. Peanuts. [place]. Salmon. Scallops. Shrimp. [place]. Tilapia. Clams. Oysters. Eggs.  Dairy: Low-fat milk. Cheese. Greek yogurt.  Beverages: Water. Red wine. Herbal tea.  Fats and oils: Extra virgin olive oil. Avocado oil. Grape seed oil.  Sweets and desserts: [place] yogurt with honey. Baked apples. Poached pears. Trail mix.  Seasoning and other foods: Basil. Cilantro. Coriander. Cumin. Mint. Parsley. Sage. Rosemary. Tarragon. Garlic. Oregano. Thyme. Pepper. Balsalmic vinegar. Tahini. Hummus. Tomato sauce. Olives. Mushrooms. ? Limit these  Grains: Prepackaged pasta or rice dishes. Prepackaged cereal with added sugar.  Vegetables: Deep fried potatoes (french fries).  Fruits: Fruit canned in syrup.  Meats and other protein foods: Beef. Pork. Lamb. Poultry with skin. Hot dogs. Berniece Salines.  Dairy: Ice cream. Sour cream. Whole milk.  Beverages: Juice. Sugar-sweetened soft drinks. Beer. Liquor and spirits.  Fats and oils: Butter. [place]ola oil. Vegetable oil. Beef fat (tallow). Lard.  Sweets and desserts: Cookies.  Cakes. Pies. Candy.  Seasoning and other foods: Mayonnaise. Premade sauces and marinades. The items listed may not be a complete list. Talk with your dietitian about what dietary choices are right for you. Summary  The Mediterranean diet includes both food and lifestyle choices.  Eat a variety of fresh fruits and vegetables, beans, nuts, seeds, and whole grains.  Limit the amount of red meat and sweets that you eat.  Talk with your health care provider about whether it is safe for you to drink red wine in moderation. This means 1 glass a day for nonpregnant women and 2 glasses a day for men. A glass of wine equals 5 oz (150 mL). This information is not intended to replace advice given to you by your  health care provider. Make sure you discuss any questions you have with your health care provider. Document Revised: 05/03/2016 Document Reviewed: 04/26/2016 Elsevier Patient Education  [place].

## 2021-02-10 NOTE — Progress Notes (Signed)
Cardiology Office Note:    Date:  02/10/2021   ID:  Christopher [name], DOB [birth year], MRN [id number]  PCP:  Horald Pollen, [facility] Cardiologist:  Ena Dawley, MD (Inactive) {  Referring MD: Horald Pollen, *    History of Present Illness:    Christopher [name] is a 60 y.o. male with a hx of prostate cancer and GERD who was previously followed by Dr. Meda Coffee who now returns to clinic for follow-up.  Initially saw Dr. Meda Coffee for palpitations, SOB and family history of CAD. Holter in 2017 showed rare PVCs and infrequent PACs. TTE 01/2016 with LVEF 60-65%, no WMA, no significant valve disease. Coronary CTA in 2019 with no significant CAD, Ca score 0.  Last saw Dr. Meda Coffee on 09/14/19 where he was doing very well. Had lost weight.  Today, he reports gaining weight since his previous visit (227->267lbs), and his blood pressure has increased. At clinic today his LDL is 162. He is concerned that his previously well controlled blood pressure and cholesterol have changed. He also notes feeling more PVC's since gaining weight, and he feels short of breath for a few seconds when they occur. He states he has not been exercising as much as he should and has not been adhering to a healthy diet. After his prostatectomy, he stopped doing the regular diet and exercise he did prior to surgery. Fortunately, he is cancer free.  He denies any chest pain, SOB, or exertional symptoms. No headaches, lightheadedness, or syncope to report. Also has no lower extremity edema, orthopnea or PND. No bleeding issues.   Past Medical History:  Diagnosis Date  . Cancer ([place])    Phreesia 03/26/2020  . GERD (gastroesophageal reflux disease) 10/31/2011  . Hearing impaired 12/17/2013   lifelong   . Hypogonadism male 10/31/2011  . Prostate cancer ([place])   . PVC (premature ventricular contraction)     Past Surgical History:  Procedure Laterality Date  . LYMPHADENECTOMY N/A  12/28/2019   Procedure: LYMPHADENECTOMY;  Surgeon: Raynelle Bring, MD;  Location: [facility];  Service: Urology;  Laterality: N/A;  . PROSTATE BIOPSY    . PROSTATE SURGERY N/A    Phreesia 03/26/2020  . ROBOT ASSISTED LAPAROSCOPIC RADICAL PROSTATECTOMY N/A 12/28/2019   Procedure: XI ROBOTIC ASSISTED LAPAROSCOPIC RADICAL PROSTATECTOMY LEVEL 2;  Surgeon: Raynelle Bring, MD;  Location: [facility];  Service: Urology;  Laterality: N/A;  . TONSILLECTOMY      Current Medications: No outpatient medications have been marked as taking for the 02/10/21 encounter (Office Visit) with Freada Bergeron, MD.     Allergies:   Asa arthritis strength-antacid [aspirin buffered], Penicillins, and Sulfa antibiotics   Social History   Socioeconomic History  . Marital status: Married    Spouse name: Not on file  . Number of children: 2  . Years of education: Not on file  . Highest education level: Not on file  Occupational History  . Occupation: Chemical engineer: [employer]  Tobacco Use  . Smoking status: Never Smoker  . Smokeless tobacco: Never Used  Vaping Use  . Vaping Use: Never used  Substance and Sexual Activity  . Alcohol use: No  . Drug use: No  . Sexual activity: Yes  Other Topics Concern  . Not on file  Social History Narrative   Married.  Education: Grade School.   Social Determinants of Health   Financial Resource Strain: Not on file  Food Insecurity: Not  on file  Transportation Needs: Not on file  Physical Activity: Not on file  Stress: Not on file  Social Connections: Not on file     Family History: The patient's family history includes Heart disease in his mother; Hyperlipidemia in his father and mother; Prostate cancer in his father; Thyroid cancer in his sister. There is no history of Breast cancer, Pancreatic cancer, or [name] cancer.  ROS:   Please see the history of present illness.    Review of Systems  Constitutional: Negative for fever and weight  loss.  HENT: Negative for sinus pain and tinnitus.   Eyes: Negative for pain and discharge.  Respiratory: Positive for shortness of breath. Negative for cough, wheezing and stridor.   Cardiovascular: Positive for palpitations. Negative for chest pain, orthopnea, claudication, leg swelling and PND.  Gastrointestinal: Negative for blood in stool, nausea and vomiting.  Genitourinary: Negative for frequency and urgency.  Musculoskeletal: Negative for back pain and myalgias.  Neurological: Negative for dizziness, weakness and headaches.  Endo/Heme/Allergies: Negative for polydipsia.  Psychiatric/Behavioral: Negative for depression. The patient is not nervous/anxious.      EKGs/Labs/Other Studies Reviewed:    The following studies were reviewed today:  ETT: 02/2014 ETT Interpretation:  normal - no evidence of ischemia by ST analysis Comments: Patient presents today for routine GXT. Has strong FH for CAD - he is obese. Has had some dyspnea and palpitations.   Today the patient exercised on the standard Bruce protocol for a total of 9 minutes.  Good exercise tolerance.  Adequate blood pressure response.  Clinically negative for chest pain. Test was stopped due to achievement of target HR.  EKG negative for ischemia. No significant arrhythmia noted.   48-Holter monitor  Rare PVCs, infrequent PACs.  No arrhythmias or significant pauses identified. Normal Holter monitor, no therapy necessary.  Accessory Clinical Findings  Echocardiogram - 2009,Overall left ventricular systolic function was normal. Left ventricular ejection fraction was estimated to be 60 %. There were no left ventricular regional wall motion abnormalities. - Estimated peak pulmonary artery systolic pressure 24 mmHg.  TTE: 01/25/2016 - Left ventricle: The cavity size was normal. Wall thickness was  normal. Systolic function was normal. The estimated ejection  fraction was in the range of 60% to 65%. Wall motion  was normal;  there were no regional wall motion abnormalities. Left  ventricular diastolic function parameters were normal. - Mitral valve: Mildly thickened leaflets . There was trivial  regurgitation. - Left atrium: The atrium was normal in size. - Right atrium: The atrium was mildly dilated. - Inferior vena cava: The vessel was normal in size. The  respirophasic diameter changes were in the normal range (>= 50%),  consistent with normal central venous pressure.  Impressions: - LVEF 60-65%, normal wall thickness, normal wall motion, normal  diastolic function, normal LA size, mildly dilated RA, no  significant TR, normal IVC.    CCTA: 06/2018  1. Coronary calcium score of 0. This was 0 percentile for age and sex matched control.  2. Normal coronary origin with right dominance.  3. No evidence of CAD.  4. 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   EKG:   02/10/2021: NSR, rate 76 bpm.  Recent Labs: 08/24/2020: ALT 36; BUN 15; Creatinine, Ser 1.18; Hemoglobin 14.4; Platelets 272; Potassium 4.4; Sodium 139  Recent Lipid Panel    Component Value Date/Time   CHOL 234 (H) 08/24/2020 1502   TRIG 169 (H) 08/24/2020 1502   HDL 41 08/24/2020 1502  CHOLHDL 5.7 (H) 08/24/2020 1502   CHOLHDL 3.9 02/20/2016 1001   VLDL 13 02/20/2016 1001   LDLCALC 162 (H) 08/24/2020 1502     Risk Assessment/Calculations:       Physical Exam:    VS:  BP (!) 148/80   Pulse 76   Ht 5\' 10"  (1.778 m)   Wt 267 lb 12.8 oz (121.5 kg)   SpO2 95%   BMI 38.43 kg/m     Wt Readings from Last 3 Encounters:  02/10/21 267 lb 12.8 oz (121.5 kg)  08/24/20 260 lb (117.9 kg)  03/28/20 227 lb (103 kg)     GEN: Well nourished, well developed in no acute distress HEENT: Normal NECK: No JVD; No carotid bruits LYMPHATICS: No lymphadenopathy CARDIAC: RRR, 1/6 systolic murmur, no rubs, no gallops RESPIRATORY:  Clear to auscultation without rales, wheezing or rhonchi  ABDOMEN:  Soft, non-tender, non-distended MUSCULOSKELETAL:  No edema; No deformity  SKIN: Warm and dry NEUROLOGIC:  Alert and oriented x 3 PSYCHIATRIC:  Normal affect   ASSESSMENT:    1. Primary hypertension   2. Hyperlipidemia, unspecified hyperlipidemia type   3. Family history of premature CAD   4. Obesity (BMI 35.0-39.9 without comorbidity)   5. Chest pain of uncertain etiology   6. Palpitations    PLAN:    In order of problems listed above:  #Hypertension: #Weight Gain: Patient has gained 40lbs since 03/2020 in the setting of poor diet and decreased exercise since his prostatectomy. Discussed how this is likely the cause of his hypertension. He would prefer to try to lose weight over the next 3 months and only start medications if he is unable to lose the weight or if his blood pressure remains elevation. -Trial of diet and lifestyle modification -If persistently elevated at next visit, will start antihypertensive agent at that time  #Noncardiac Chest Pain: Coronary CTA negative for obstructive disease with Ca score 0. TTE with normal LVEF, no valve disease. -Continue lifestyle modifications -Continue crestor 10mg  daily  #Palpitations: Rare PVCs and PACs, no significant arrthyhmias. Increased frequency with the weight gain. Would like to avoid medications and see if improve with weight loss as above. Can start BB if needed. -Continue to monitor; if symptoms persist, can start BB  #HLD: Ca score 0 in 2019 with no significant disease on coronary CTA. LDL very elevated at 162 in the setting of 40lbs weight gain. As above, we are doing a trial of diet and execise and will check repeat lipids in 3 months with plans to start crestor if LDL remains elevated. -Trial of diet and weight loss as above -Repeat lipids in 46months and if remains elevated, will start crestor 10mg  daily  #Obesity with BMI 38: With 40lbs weight gain since 03/2020 as detailed above. Patient is very motivated to lose  the weight again which we discussed at length. -Discussed diet and exercise at length as detailed below  Exercise recommendations: Goal of exercising for at least 30 minutes a day, at least 5 times per week.  Please exercise to a moderate exertion.  This means that while exercising it is difficult to speak in full sentences, however you are not so short of breath that you feel you must stop, and not so comfortable that you can carry on a full conversation.  Exertion level should be approximately a 5/10, if 10 is the most exertion you can perform.  Diet recommendations: Recommend a heart healthy diet such as the Mediterranean diet.  This diet consists  of plant based foods, healthy fats, lean meats, olive oil.  It suggests limiting the intake of simple carbohydrates such as white breads, pastries, and pastas.  It also limits the amount of red meat, wine, and dairy products such as cheese that one should consume on a daily basis.       Follow-up in 3 months.  Medication Adjustments/Labs and Tests Ordered: Current medicines are reviewed at length with the patient today.  Concerns regarding medicines are outlined above.  Orders Placed This Encounter  Procedures  . Lipid panel  . EKG 12-Lead   No orders of the defined types were placed in this encounter.   Patient Instructions   Medication Instructions:  Your physician recommends that you continue on your current medications as directed. Please refer to the Current Medication list given to you today.  *If you need a refill on your cardiac medications before your next appointment, please call your pharmacy*   Lab Work: Lipid panel in 3 mths at f/u visit (you will need to be fasting for this lab) If you have labs (blood work) drawn today and your tests are completely normal, you will receive your results only by: Marland Kitchen MyChart Message (if you have MyChart) OR . A paper copy in the mail If you have any lab test that is abnormal or we need to  change your treatment, we will call you to review the results.   Testing/Procedures: None   Follow-Up: At [facility], you and your health needs are our priority.  As part of our continuing mission to provide you with exceptional heart care, we have created designated Provider Care Teams.  These Care Teams include your primary Cardiologist (physician) and Advanced Practice Providers (APPs -  Physician Assistants and Nurse Practitioners) who all work together to provide you with the care you need, when you need it.  We recommend signing up for the patient portal called "MyChart".  Sign up information is provided on this After Visit Summary.  MyChart is used to connect with patients for Virtual Visits (Telemedicine).  Patients are able to view lab/test results, encounter notes, upcoming appointments, etc.  Non-urgent messages can be sent to your provider as well.   To learn more about what you can do with MyChart, go to [email or web address].    Your next appointment:   3 month(s)  The format for your next appointment:   In Person  Provider:   You may see Dr. Johney Frame or one of the following Advanced Practice Providers on your designated Care Team:    Richardson Dopp, PA-C  [place], [place]    Other Instructions Mediterranean Diet A Mediterranean diet refers to food and lifestyle choices that are based on the traditions of countries located on the [employer]. This way of eating has been shown to help prevent certain conditions and improve outcomes for people who have chronic diseases, like kidney disease and heart disease. What are tips for following this plan? Lifestyle  Cook and eat meals together with your family, when possible.  Drink enough fluid to keep your urine clear or pale yellow.  Be physically active every day. This includes: ? Aerobic exercise like running or swimming. ? Leisure activities like gardening, walking, or housework.  Get 7-8 hours of sleep  each night.  If recommended by your health care provider, drink red wine in moderation. This means 1 glass a day for nonpregnant women and 2 glasses a day for men. A glass of wine equals 5 oz (  150 mL). Reading food labels  Check the serving size of packaged foods. For foods such as rice and pasta, the serving size refers to the amount of cooked product, not dry.  Check the total fat in packaged foods. Avoid foods that have saturated fat or trans fats.  Check the ingredients list for added sugars, such as corn syrup.   Shopping  At the grocery store, buy most of your food from the areas near the walls of the store. This includes: ? Fresh fruits and vegetables (produce). ? Grains, beans, nuts, and seeds. Some of these may be available in unpackaged forms or large amounts (in bulk). ? Fresh seafood. ? Poultry and eggs. ? Low-fat dairy products.  Buy whole ingredients instead of prepackaged foods.  Buy fresh fruits and vegetables in-season from local farmers markets.  Buy frozen fruits and vegetables in resealable bags.  If you do not have access to quality fresh seafood, buy precooked frozen shrimp or canned fish, such as tuna, salmon, or sardines.  Buy small amounts of raw or cooked vegetables, salads, or olives from the deli or salad bar at your store.  Stock your pantry so you always have certain foods on hand, such as olive oil, canned tuna, canned tomatoes, rice, pasta, and beans. Cooking  Cook foods with extra-virgin olive oil instead of using butter or other vegetable oils.  Have meat as a side dish, and have vegetables or grains as your main dish. This means having meat in small portions or adding small amounts of meat to foods like pasta or stew.  Use beans or vegetables instead of meat in common dishes like chili or lasagna.  Experiment with different cooking methods. Try roasting or broiling vegetables instead of steaming or sauteing them.  Add frozen vegetables to  soups, stews, pasta, or rice.  Add nuts or seeds for added healthy fat at each meal. You can add these to yogurt, salads, or vegetable dishes.  Marinate fish or vegetables using olive oil, lemon juice, garlic, and fresh herbs. Meal planning  Plan to eat 1 vegetarian meal one day each week. Try to work up to 2 vegetarian meals, if possible.  Eat seafood 2 or more times a week.  Have healthy snacks readily available, such as: ? Vegetable sticks with hummus. ? [place] yogurt. ? Fruit and nut trail mix.  Eat balanced meals throughout the week. This includes: ? Fruit: 2-3 servings a day ? Vegetables: 4-5 servings a day ? Low-fat dairy: 2 servings a day ? Fish, poultry, or lean meat: 1 serving a day ? Beans and legumes: 2 or more servings a week ? Nuts and seeds: 1-2 servings a day ? Whole grains: 6-8 servings a day ? Extra-virgin olive oil: 3-4 servings a day  Limit red meat and sweets to only a few servings a month   What are my food choices?  Mediterranean diet ? Recommended  Grains: Whole-grain pasta. Brown rice. Bulgar wheat. Polenta. Couscous. Whole-wheat bread. [place].  Vegetables: Artichokes. Beets. Broccoli. Cabbage. Carrots. Eggplant. Green beans. Chard. Kale. Spinach. Onions. Leeks. Peas. Squash. Tomatoes. Peppers. Radishes.  Fruits: Apples. Apricots. Avocado. Berries. Bananas. Cherries. Dates. Figs. Grapes. Lemons. Melon. Oranges. Peaches. Plums. Pomegranate.  Meats and other protein foods: Beans. Almonds. Sunflower seeds. Pine nuts. Peanuts. [place]. Salmon. Scallops. Shrimp. [place]. Tilapia. Clams. Oysters. Eggs.  Dairy: Low-fat milk. Cheese. Greek yogurt.  Beverages: Water. Red wine. Herbal tea.  Fats and oils: Extra virgin olive oil. Avocado oil. Grape seed oil.  Sweets and desserts: [place] yogurt with honey. Baked apples. Poached pears. Trail mix.  Seasoning and other foods: Basil. Cilantro. Coriander. Cumin. Mint. Parsley. Sage. Rosemary. Tarragon. Garlic.  Oregano. Thyme. Pepper. Balsalmic vinegar. Tahini. Hummus. Tomato sauce. Olives. Mushrooms. ? Limit these  Grains: Prepackaged pasta or rice dishes. Prepackaged cereal with added sugar.  Vegetables: Deep fried potatoes (french fries).  Fruits: Fruit canned in syrup.  Meats and other protein foods: Beef. Pork. Lamb. Poultry with skin. Hot dogs. Berniece Salines.  Dairy: Ice cream. Sour cream. Whole milk.  Beverages: Juice. Sugar-sweetened soft drinks. Beer. Liquor and spirits.  Fats and oils: Butter. Canola oil. Vegetable oil. Beef fat (tallow). Lard.  Sweets and desserts: Cookies. Cakes. Pies. Candy.  Seasoning and other foods: Mayonnaise. Premade sauces and marinades. The items listed may not be a complete list. Talk with your dietitian about what dietary choices are right for you. Summary  The Mediterranean diet includes both food and lifestyle choices.  Eat a variety of fresh fruits and vegetables, beans, nuts, seeds, and whole grains.  Limit the amount of red meat and sweets that you eat.  Talk with your health care provider about whether it is safe for you to drink red wine in moderation. This means 1 glass a day for nonpregnant women and 2 glasses a day for men. A glass of wine equals 5 oz (150 mL). This information is not intended to replace advice given to you by your health care provider. Make sure you discuss any questions you have with your health care provider. Document Revised: 05/03/2016 Document Reviewed: 04/26/2016 Elsevier Patient Education  [place].       [employer],acting as a Education administrator for Freada Bergeron, MD.,have documented all relevant documentation on the behalf of Freada Bergeron, MD,as directed by  Freada Bergeron, MD while in the presence of Freada Bergeron, MD.  I, Freada Bergeron, MD, have reviewed all documentation for this visit. The documentation on 02/10/21 for the exam, diagnosis, procedures, and orders are all accurate  and complete.  Signed, Freada Bergeron, MD  02/10/2021 5:07 PM    [place]

## 2021-02-21 ENCOUNTER — Encounter: Payer: Self-pay | Admitting: Emergency Medicine

## 2021-02-21 ENCOUNTER — Other Ambulatory Visit: Payer: Self-pay | Admitting: Emergency Medicine

## 2021-02-21 ENCOUNTER — Ambulatory Visit: Payer: 59 | Admitting: Emergency Medicine

## 2021-02-21 ENCOUNTER — Other Ambulatory Visit: Payer: Self-pay

## 2021-02-21 VITALS — BP 112/66 | HR 93 | Temp 98.7°F | Ht 70.0 in | Wt 260.8 lb

## 2021-02-21 DIAGNOSIS — Z23 Encounter for immunization: Secondary | ICD-10-CM | POA: Diagnosis not present

## 2021-02-21 DIAGNOSIS — R030 Elevated blood-pressure reading, without diagnosis of hypertension: Secondary | ICD-10-CM | POA: Diagnosis not present

## 2021-02-21 DIAGNOSIS — C61 Malignant neoplasm of prostate: Secondary | ICD-10-CM

## 2021-02-21 DIAGNOSIS — R7989 Other specified abnormal findings of blood chemistry: Secondary | ICD-10-CM | POA: Diagnosis not present

## 2021-02-21 DIAGNOSIS — E785 Hyperlipidemia, unspecified: Secondary | ICD-10-CM

## 2021-02-21 DIAGNOSIS — H9190 Unspecified hearing loss, unspecified ear: Secondary | ICD-10-CM | POA: Diagnosis not present

## 2021-02-21 LAB — COMPREHENSIVE METABOLIC PANEL
ALT: 27 U/L (ref 0–53)
AST: 24 U/L (ref 0–37)
Albumin: 4.2 g/dL (ref 3.5–5.2)
Alkaline Phosphatase: 70 U/L (ref 39–117)
BUN: 16 mg/dL (ref 6–23)
CO2: 24 mEq/L (ref 19–32)
Calcium: 9.4 mg/dL (ref 8.4–10.5)
Chloride: 104 mEq/L (ref 96–112)
Creatinine, Ser: 1.2 mg/dL (ref 0.40–1.50)
GFR: 66.16 mL/min (ref >60.00)
Glucose, Bld: 99 mg/dL (ref 70–99)
Potassium: 4.4 mEq/L (ref 3.5–5.1)
Sodium: 140 mEq/L (ref 135–145)
Total Bilirubin: 1.1 mg/dL (ref 0.2–1.2)
Total Protein: 7.5 g/dL (ref 6.0–8.3)

## 2021-02-21 LAB — LIPID PANEL
Cholesterol: 207 mg/dL — ABNORMAL HIGH (ref 0–200)
HDL: 42.5 mg/dL (ref >39.00)
LDL Cholesterol: 151 mg/dL — ABNORMAL HIGH (ref 0–99)
NonHDL: 164.54
Total CHOL/HDL Ratio: 5
Triglycerides: 67 mg/dL (ref 0.0–149.0)
VLDL: 13.4 mg/dL (ref 0.0–40.0)

## 2021-02-21 LAB — TSH: TSH: 1.76 u[IU]/mL (ref 0.35–4.50)

## 2021-02-21 LAB — PSA: PSA: 0.01 ng/mL — ABNORMAL LOW (ref 0.10–4.00)

## 2021-02-21 LAB — HEMOGLOBIN A1C: Hgb A1c MFr Bld: 5.8 % (ref 4.6–6.5)

## 2021-02-21 MED ORDER — ROSUVASTATIN CALCIUM 10 MG PO TABS: 10.0000 mg | ORAL_TABLET | Freq: Every day | ORAL | 3 refills | Status: DC

## 2021-02-21 NOTE — Progress Notes (Signed)
Christopher [name] Colon 60 y.o.   Chief Complaint  Patient presents with  . Follow-up    6 months    HISTORY OF PRESENT ILLNESS: This is a 60 y.o. male here for 90-month follow-up of medical problems. #1 history of prostate cancer with radical prostatectomy.  Doing well.  Sees urologist on a regular basis. Last PSA was undetectable. #2 history of PVCs.  Saw cardiologist about 2 weeks ago.  Blood pressure was elevated.  Was advised to lose some weight.  Not on medication.  No additional testing was recommended. #3 abnormal lipid profile in the past. Works out regularly.  Eating healthy. No other complaints or medical concerns today.  HPI   Prior to Admission medications   Not on File    Allergies  Allergen Reactions  . Asa Arthritis Strength-Antacid [Aspirin Buffered] Swelling  . Penicillins Hives    Did it involve swelling of the face/tongue/throat, SOB, or low BP? No Did it involve sudden or severe rash/hives, skin peeling, or any reaction on the inside of your mouth or nose? No Did you need to seek medical attention at a hospital or doctor's office? No When did it last happen?childhood If all above answers are "NO", may proceed with cephalosporin use.   Ignacia Bayley Antibiotics Hives    Patient Active Problem List   Diagnosis Date Noted  . Prostate cancer ([place]) 12/28/2019  . Malignant neoplasm of prostate ([place]) 11/17/2019  . Family history of premature CAD 01/13/2016  . Hyperlipidemia 01/13/2016  . Low testosterone in male 05/07/2014  . Hearing impaired 12/17/2013    Past Medical History:  Diagnosis Date  . Cancer ([place])    Phreesia 03/26/2020  . GERD (gastroesophageal reflux disease) 10/31/2011  . Hearing impaired 12/17/2013   lifelong   . Hypogonadism male 10/31/2011  . Prostate cancer ([place])   . PVC (premature ventricular contraction)     Past Surgical History:  Procedure Laterality Date  . LYMPHADENECTOMY N/A 12/28/2019   Procedure: LYMPHADENECTOMY;   Surgeon: Raynelle Bring, MD;  Location: [facility];  Service: Urology;  Laterality: N/A;  . PROSTATE BIOPSY    . PROSTATE SURGERY N/A    Phreesia 03/26/2020  . ROBOT ASSISTED LAPAROSCOPIC RADICAL PROSTATECTOMY N/A 12/28/2019   Procedure: XI ROBOTIC ASSISTED LAPAROSCOPIC RADICAL PROSTATECTOMY LEVEL 2;  Surgeon: Raynelle Bring, MD;  Location: [facility];  Service: Urology;  Laterality: N/A;  . TONSILLECTOMY      Social History   Socioeconomic History  . Marital status: Married    Spouse name: Not on file  . Number of children: 2  . Years of education: Not on file  . Highest education level: Not on file  Occupational History  . Occupation: Chemical engineer: [employer]  Tobacco Use  . Smoking status: Never Smoker  . Smokeless tobacco: Never Used  Vaping Use  . Vaping Use: Never used  Substance and Sexual Activity  . Alcohol use: No  . Drug use: No  . Sexual activity: Yes  Other Topics Concern  . Not on file  Social History Narrative   Married.  Education: Grade School.   Social Determinants of Health   Financial Resource Strain: Not on file  Food Insecurity: Not on file  Transportation Needs: Not on file  Physical Activity: Not on file  Stress: Not on file  Social Connections: Not on file  Intimate Partner Violence: Not on file    Family History  Problem Relation Age of Onset  . Hyperlipidemia  Father   . Prostate cancer Father        treated with ADT and XRT  . Heart disease Mother   . Hyperlipidemia Mother   . Thyroid cancer Sister   . Breast cancer Neg Hx   . Pancreatic cancer Neg Hx   . Colon cancer Neg Hx      Review of Systems  Constitutional: Negative.  Negative for chills and fever.  HENT: Positive for hearing loss. Negative for congestion and sore throat.   Respiratory: Negative.  Negative for cough and shortness of breath.   Cardiovascular: Negative.  Negative for chest pain and palpitations.  Gastrointestinal: Negative.  Negative for  abdominal pain, diarrhea, nausea and vomiting.  Genitourinary: Negative.  Negative for dysuria and hematuria.  Musculoskeletal: Negative.  Negative for joint pain and myalgias.  Skin: Negative.  Negative for rash.  Neurological: Negative.  Negative for dizziness and headaches.  All other systems reviewed and are negative.  Vitals:   02/21/21 0828  BP: 112/66  Pulse: 93  Temp: 98.7 F (37.1 C)  SpO2: 97%   BP Readings from Last 3 Encounters:  02/21/21 112/66  02/10/21 (!) 148/80  08/24/20 124/76   Wt Readings from Last 3 Encounters:  02/21/21 260 lb 12.8 oz (118.3 kg)  02/10/21 267 lb 12.8 oz (121.5 kg)  08/24/20 260 lb (117.9 kg)     Physical Exam Vitals reviewed.  Constitutional:      Appearance: Normal appearance.  HENT:     Head: Normocephalic.  Eyes:     Extraocular Movements: Extraocular movements intact.     Conjunctiva/sclera: Conjunctivae normal.     Pupils: Pupils are equal, round, and reactive to light.  Cardiovascular:     Rate and Rhythm: Normal rate and regular rhythm.     Pulses: Normal pulses.     Heart sounds: Normal heart sounds.  Pulmonary:     Effort: Pulmonary effort is normal.     Breath sounds: Normal breath sounds.  Abdominal:     Palpations: Abdomen is soft.     Tenderness: There is no abdominal tenderness.  Musculoskeletal:        General: Normal range of motion.     Cervical back: Normal range of motion and neck supple.     Right lower leg: No edema.     Left lower leg: No edema.  Skin:    General: Skin is warm and dry.     Capillary Refill: Capillary refill takes less than 2 seconds.  Neurological:     General: No focal deficit present.     Mental Status: He is alert and oriented to person, place, and time.  Psychiatric:        Mood and Affect: Mood normal.        Behavior: Behavior normal.      ASSESSMENT & PLAN: A total of 30 minutes was spent with the patient and counseling/coordination of care regarding multiple chronic  medical problems under management, hypertension and need to monitor blood pressure readings at home, education on nutrition in particular dietary approaches to stop hypertension and need to decrease carbohydrate intake, review of most recent office visit notes, review of most recent blood work results, health maintenance items including need for vaccinations, prognosis, documentation and need for follow-up.  Transient hypertension Normotensive in the office.  Advised to monitor blood pressure readings at home several times a day for the next few weeks and keep a log.  Prostate cancer [facility]) Doing well.  No  lower urinary tract symptoms.  We will repeat PSA today. Sees urologist on a regular basis.  Low testosterone in male We will do labs today.  Asymptomatic.  Hyperlipidemia Fasting lipid profile done today.  Diet and nutrition discussed.  Tullio was seen today for follow-up.  Diagnoses and all orders for this visit:  Hyperlipidemia, unspecified hyperlipidemia type -     Lipid panel -     Hemoglobin A1c  Need for vaccination against Streptococcus pneumoniae using pneumococcal conjugate vaccine 13 -     Cancel: Pneumococcal conjugate vaccine 13-valent IM -     Pneumococcal conjugate vaccine 13-valent IM  Need for shingles vaccine -     Varicella-zoster vaccine IM  Malignant neoplasm of prostate (HCC) -     PSA  Low testosterone in male -     TestT+TestF+SHBG  Hearing loss, unspecified hearing loss type, unspecified laterality  Transient hypertension -     Comprehensive metabolic panel -     TSH -     Hemoglobin A1c  Prostate cancer [facility])    Patient Instructions   Health Maintenance, Male Adopting a healthy lifestyle and getting preventive care are important in promoting health and wellness. Ask your health care provider about:  The right schedule for you to have regular tests and exams.  Things you can do on your own to prevent diseases and keep yourself  healthy. What should I know about diet, weight, and exercise? Eat a healthy diet  Eat a diet that includes plenty of vegetables, fruits, low-fat dairy products, and lean protein.  Do not eat a lot of foods that are high in solid fats, added sugars, or sodium.   Maintain a healthy weight Body mass index (BMI) is a measurement that can be used to identify possible weight problems. It estimates body fat based on height and weight. Your health care provider can help determine your BMI and help you achieve or maintain a healthy weight. Get regular exercise Get regular exercise. This is one of the most important things you can do for your health. Most adults should:  Exercise for at least 150 minutes each week. The exercise should increase your heart rate and make you sweat (moderate-intensity exercise).  Do strengthening exercises at least twice a week. This is in addition to the moderate-intensity exercise.  Spend less time sitting. Even light physical activity can be beneficial. Watch cholesterol and blood lipids Have your blood tested for lipids and cholesterol at 60 years of age, then have this test every 5 years. You may need to have your cholesterol levels checked more often if:  Your lipid or cholesterol levels are high.  You are older than 60 years of age.  You are at high risk for heart disease. What should I know about cancer screening? Many types of cancers can be detected early and may often be prevented. Depending on your health history and family history, you may need to have cancer screening at various ages. This may include screening for:  Colorectal cancer.  Prostate cancer.  Skin cancer.  Lung cancer. What should I know about heart disease, diabetes, and high blood pressure? Blood pressure and heart disease  High blood pressure causes heart disease and increases the risk of stroke. This is more likely to develop in people who have high blood pressure readings, are  of African descent, or are overweight.  Talk with your health care provider about your target blood pressure readings.  Have your blood pressure checked: ? Every  3-5 years if you are 32-36 years of age. ? Every year if you are 78 years old or older.  If you are between the ages of 58 and 16 and are a current or former smoker, ask your health care provider if you should have a one-time screening for abdominal aortic aneurysm (AAA). Diabetes Have regular diabetes screenings. This checks your fasting blood sugar level. Have the screening done:  Once every three years after age 32 if you are at a normal weight and have a low risk for diabetes.  More often and at a younger age if you are overweight or have a high risk for diabetes. What should I know about preventing infection? Hepatitis B If you have a higher risk for hepatitis B, you should be screened for this virus. Talk with your health care provider to find out if you are at risk for hepatitis B infection. Hepatitis C Blood testing is recommended for:  Everyone born from [birth year] through 1965.  Anyone with known risk factors for hepatitis C. Sexually transmitted infections (STIs)  You should be screened each year for STIs, including gonorrhea and chlamydia, if: ? You are sexually active and are younger than 60 years of age. ? You are older than 60 years of age and your health care provider tells you that you are at risk for this type of infection. ? Your sexual activity has changed since you were last screened, and you are at increased risk for chlamydia or gonorrhea. Ask your health care provider if you are at risk.  Ask your health care provider about whether you are at high risk for HIV. Your health care provider may recommend a prescription medicine to help prevent HIV infection. If you choose to take medicine to prevent HIV, you should first get tested for HIV. You should then be tested every 3 months for as long as you are taking  the medicine. Follow these instructions at home: Lifestyle  Do not use any products that contain nicotine or tobacco, such as cigarettes, e-cigarettes, and chewing tobacco. If you need help quitting, ask your health care provider.  Do not use street drugs.  Do not share needles.  Ask your health care provider for help if you need support or information about quitting drugs. Alcohol use  Do not drink alcohol if your health care provider tells you not to drink.  If you drink alcohol: ? Limit how much you have to 0-2 drinks a day. ? Be aware of how much alcohol is in your drink. In the [place] one drink equals one 12 oz bottle of beer (355 mL), one 5 oz glass of wine (148 mL), or one 1 oz glass of hard liquor (44 mL). General instructions  Schedule regular health, dental, and eye exams.  Stay current with your vaccines.  Tell your health care provider if: ? You often feel depressed. ? You have ever been abused or do not feel safe at home. Summary  Adopting a healthy lifestyle and getting preventive care are important in promoting health and wellness.  Follow your health care provider's instructions about healthy diet, exercising, and getting tested or screened for diseases.  Follow your health care provider's instructions on monitoring your cholesterol and blood pressure. This information is not intended to replace advice given to you by your health care provider. Make sure you discuss any questions you have with your health care provider. Document Revised: 08/27/2018 Document Reviewed: 08/27/2018 Elsevier Patient Education  2021 [employer].  Agustina Caroli, MD [facility]

## 2021-02-21 NOTE — Assessment & Plan Note (Signed)
Doing well.  No lower urinary tract symptoms.  We will repeat PSA today. Sees urologist on a regular basis.

## 2021-02-21 NOTE — Patient Instructions (Signed)

## 2021-02-21 NOTE — Assessment & Plan Note (Signed)
Fasting lipid profile done today.  Diet and nutrition discussed.

## 2021-02-21 NOTE — Assessment & Plan Note (Signed)
Normotensive in the office.  Advised to monitor blood pressure readings at home several times a day for the next few weeks and keep a log.

## 2021-02-21 NOTE — Assessment & Plan Note (Signed)
We will do labs today.  Asymptomatic.

## 2021-02-22 ENCOUNTER — Ambulatory Visit: Payer: Self-pay | Admitting: Emergency Medicine

## 2021-02-27 LAB — TESTT+TESTF+SHBG
Sex Hormone Binding: 24.5 nmol/L (ref 19.3–76.4)
Testosterone, Free: 6.2 pg/mL — ABNORMAL LOW (ref 7.2–24.0)
Testosterone, Total, LC/MS: 304.2 ng/dL (ref 264.0–916.0)

## 2021-04-26 IMAGING — NM NM BONE WHOLE BODY
2 series · 2 of 2 positions shown · non-contrast
Comparison: None.

CLINICAL DATA: Prostate cancer.  Elevated PSA.

EXAM:
NUCLEAR MEDICINE WHOLE BODY BONE SCAN
TECHNIQUE: Whole body anterior and posterior images were obtained approximately
3 hours after intravenous injection of radiopharmaceutical.
RADIOPHARMACEUTICALS:  21.6 mCi 5echnetium-22m MDP IV

[Series 1: wbr_bone_40 whole body · 2.66mm/px · 1 of 1 slices shown (1 of 2)]
[im 1/1]
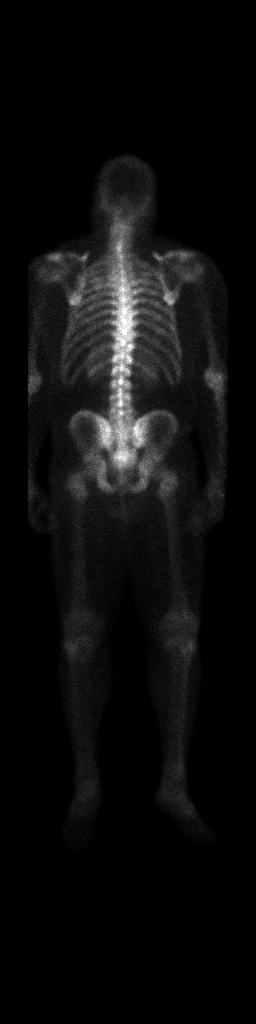

[Series 1: wbr_bone_40 whole body · 2.66mm/px · 1 of 1 slices shown (2 of 2)]
[im 1/1]
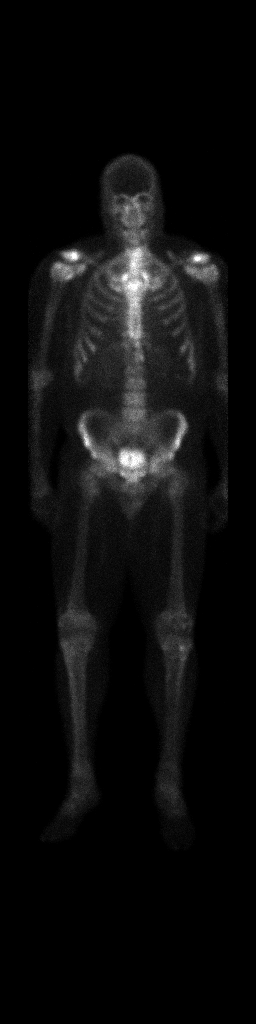

[2 of 2 positions shown; findings below may reference images not displayed]

FINDINGS: Mild degenerative uptake is present at the knees bilaterally. Uptake
is also noted at the AC joints in sternoclavicular joints
bilaterally. No discrete osseous uptake is present to suggest
metastatic disease.
IMPRESSION: 1. No evidence for metastatic disease to the axial skeleton.
2. Degenerative changes as detailed above.

## 2021-05-10 ENCOUNTER — Other Ambulatory Visit: Payer: 59 | Admitting: *Deleted

## 2021-05-10 ENCOUNTER — Other Ambulatory Visit: Payer: Self-pay

## 2021-05-10 DIAGNOSIS — E785 Hyperlipidemia, unspecified: Secondary | ICD-10-CM

## 2021-05-10 LAB — LIPID PANEL
Chol/HDL Ratio: 5 ratio (ref 0.0–5.0)
Cholesterol, Total: 189 mg/dL (ref 100–199)
HDL: 38 mg/dL — ABNORMAL LOW (ref >39)
LDL Chol Calc (NIH): 135 mg/dL — ABNORMAL HIGH (ref 0–99)
Triglycerides: 85 mg/dL (ref 0–149)
VLDL Cholesterol Cal: 16 mg/dL (ref 5–40)

## 2021-05-11 ENCOUNTER — Telehealth: Payer: Self-pay | Admitting: Cardiology

## 2021-05-11 DIAGNOSIS — E785 Hyperlipidemia, unspecified: Secondary | ICD-10-CM

## 2021-05-11 DIAGNOSIS — Z79899 Other long term (current) drug therapy: Secondary | ICD-10-CM

## 2021-05-11 DIAGNOSIS — Z8249 Family history of ischemic heart disease and other diseases of the circulatory system: Secondary | ICD-10-CM

## 2021-05-11 MED ORDER — ROSUVASTATIN CALCIUM 10 MG PO TABS: 10.0000 mg | ORAL_TABLET | Freq: Every day | ORAL | 1 refills | Status: DC

## 2021-05-11 NOTE — Telephone Encounter (Signed)
Patient returning call for lab results. 

## 2021-05-11 NOTE — Telephone Encounter (Signed)
-----   Message from Leeroy Bock, [place] sent at 05/11/2021  7:27 AM EDT ----- Need to clarify if pt started taking the rosuvastatin '10mg'$  daily that his PCP prescribed on 6/7. His LDL at that time was 151 and has only dropped by 10% which suggests he's not taking rosuvastatin consistently (should have dropped his LDL by 30-50%). If he has not been taking rosuvastatin, he needs to start. If he has been, recommend increasing to '40mg'$  daily and rechecking lipids in 2 months. LDL goal < 100 for primary prevention with family hx of CAD.

## 2021-05-11 NOTE — Telephone Encounter (Signed)
Spoke with the pt about lab results and recommendations per Dr. Radford Pax (covering for Dr. Johney Frame) and [place] Pharmacist in lipid clinic. Pt did confirm with me that his PCP did want to start him on crestor 10 mg, but when he saw him in June, he advised the pt that he could try losing weight and change his diet and lifestyle.  Pt states he did do this and lost over 30 lbs.  Pt states he does have a family history of HLP.  Informed the pt that with that said, he needs to start taking rosuvastatin 10 mg po daily and we can repeat fasting lipids in 3 months.   Confirmed the pharmacy of choice with the pt. Scheduled the pt to come back in for repeat lipids in 2 months on 10/25.  He is aware to come fasting to this lab appt. Pt verbalized understanding and agrees with this plan.

## 2021-05-16 NOTE — Progress Notes (Addendum)
Cardiology Office Note:    Date:  05/17/2021   ID:  [name], DOB 02-Jun-1961, MRN [id number]  PCP:  Horald Pollen, MD   [facility] Cardiologist:  Freada Bergeron, MD      Referring MD: Horald Pollen, *   Chief Complaint:  Follow-up on blood pressure and cholesterol    Patient Profile:    Christopher [name] is a 60 y.o. male with:  Prostate CA  GERD  FHx of CAD Holter 2017: rare PVCs; infrequent PACs Coronary CTA 2019: no CAD, CAC 0 Hypertension  Hyperlipidemia   Prior CV studies:  CT CORONARY MORPH W/CTA COR W/SCORE W/CA W/CM &/OR WO/CM 07/16/2018 Aorta:  Normal size.  No calcifications.  No dissection. Coronary Arteries:  Normal coronary origin.  Right dominance. RCA:  no plaque. Left main is a large artery that gives rise to LAD and LCX arteries. LAD: no plaque. LCX:  no plaque.  IMPRESSION: 1. Coronary calcium score of 0. This was 0 percentile for age and sex matched control. 2. Normal coronary origin with right dominance. 3. No evidence of CAD. 4.  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  Holter 48 Hr 01/25/16 Rare PVCs, infrequent PACs. No arrhythmias or significant pauses identified. Normal Holter monitor, no therapy necessary.  Echocardiogram 01/25/16 EF 60-65, no RWMA, trivial MR, mild RAE    History of Present Illness: Mr. Alvira Monday [name] was last seen by Dr. Johney Frame in 5/22.  The pt had gained ~ 40 lbs and his BP and chol were out of control.  A trial of diet, exercise and weight loss was recommended.  If his BP and cholesterol did not improve with this he would need med Rx.  He returns for f/u.  He is here alone.  Recent lipid panel demonstrated an LDL of 135.  He was asked to go ahead and start rosuvastatin.  He started taking the medication a few days ago.  Plan is to repeat his lipids in 3 months.  He is doing well without chest pain, shortness of breath, syncope, orthopnea, leg edema.  He has  lost 26 pounds since his last visit.    Past Medical History:  Diagnosis Date   Cancer ([place])    Phreesia 03/26/2020   GERD (gastroesophageal reflux disease) 10/31/2011   Hearing impaired 12/17/2013   lifelong    Hypogonadism male 10/31/2011   Prostate cancer ([place])    PVC (premature ventricular contraction)     Current Medications: Current Meds  Medication Sig   rosuvastatin (CRESTOR) 10 MG tablet Take 1 tablet (10 mg total) by mouth daily.     Allergies:   Asa arthritis strength-antacid [aspirin buffered], Penicillins, and Sulfa antibiotics   Social History   Tobacco Use   Smoking status: Never   Smokeless tobacco: Never  Vaping Use   Vaping Use: Never used  Substance Use Topics   Alcohol use: No   Drug use: No     Family Hx: The patient's family history includes Heart disease in his mother; Hyperlipidemia in his father and mother; Prostate cancer in his father; Thyroid cancer in his sister. There is no history of Breast cancer, Pancreatic cancer, or [name] cancer.  ROS   EKGs/Labs/Other Test Reviewed:    EKG:  EKG is not ordered today.  The ekg ordered today demonstrates N/A  Recent Labs: 08/24/2020: Hemoglobin 14.4; Platelets 272 02/21/2021: ALT 27; BUN 16; Creatinine, Ser 1.20; Potassium 4.4; Sodium 140; TSH 1.76  Recent Lipid Panel Lab Results  Component Value Date/Time   CHOL 189 05/10/2021 07:49 AM   TRIG 85 05/10/2021 07:49 AM   HDL 38 (L) 05/10/2021 07:49 AM   LDLCALC 135 (H) 05/10/2021 07:49 AM      Risk Assessment/Calculations:          Physical Exam:    VS:  BP 130/70   Pulse 72   Ht '5\' 10"'$  (1.778 m)   Wt 241 lb 12.8 oz (109.7 kg)   SpO2 98%   BMI 34.69 kg/m     Wt Readings from Last 3 Encounters:  05/17/21 241 lb 12.8 oz (109.7 kg)  02/21/21 260 lb 12.8 oz (118.3 kg)  02/10/21 267 lb 12.8 oz (121.5 kg)     Constitutional:      Appearance: Healthy appearance. Not in distress.  Neck:     Vascular: JVD normal.  Pulmonary:      Effort: Pulmonary effort is normal.     Breath sounds: No wheezing. No rales.  Cardiovascular:     Normal rate. Regular rhythm. Normal S1. Normal S2.      Murmurs: There is no murmur.  Edema:    Peripheral edema absent.  Abdominal:     Palpations: Abdomen is soft. There is no hepatomegaly.  Skin:    General: Skin is warm and dry.  Neurological:     General: No focal deficit present.     Mental Status: Alert and oriented to person, place and time.     Cranial Nerves: Cranial nerves are intact.         ASSESSMENT & PLAN:    1. Essential hypertension BP Readings from Last 3 Encounters:  05/17/21 130/70  02/21/21 112/66  02/10/21 (!) 148/80    Blood pressure has improved with weight loss.  I have is encouraged him to continue with diet and exercise.  We discussed the importance of keeping his blood pressure <130/80.  He knows to contact us should he have elevations in his blood pressure.  We discussed a low-sodium diet as well.  2. Pure hypercholesterolemia He was recent started on rosuvastatin.  He has follow-up labs pending in 3 months.  He was asking about assessment of his risk.  Calcium score was 0 in 2019.  We could consider repeating CAC in 1 year.         Dispo:  Return in about 6 months (around 11/14/2021) for Routine Follow Up, w/ Dr. Johney Frame.   Medication Adjustments/Labs and Tests Ordered: Current medicines are reviewed at length with the patient today.  Concerns regarding medicines are outlined above.  Tests Ordered: No orders of the defined types were placed in this encounter.  Medication Changes: No orders of the defined types were placed in this encounter.   Signed, Richardson Dopp, PA-C  05/17/2021 9:57 AM    [facility] [address], [place], [place]  [address] Phone: [phone]; Fax: [phone]

## 2021-05-17 ENCOUNTER — Other Ambulatory Visit: Payer: Self-pay

## 2021-05-17 ENCOUNTER — Ambulatory Visit: Payer: 59 | Admitting: Physician Assistant

## 2021-05-17 ENCOUNTER — Encounter: Payer: Self-pay | Admitting: Physician Assistant

## 2021-05-17 VITALS — BP 130/70 | HR 72 | Ht 70.0 in | Wt 241.8 lb

## 2021-05-17 DIAGNOSIS — I10 Essential (primary) hypertension: Secondary | ICD-10-CM | POA: Diagnosis not present

## 2021-05-17 DIAGNOSIS — E7800 Pure hypercholesterolemia, unspecified: Secondary | ICD-10-CM

## 2021-05-17 NOTE — Patient Instructions (Signed)
Medication Instructions:  Your physician recommends that you continue on your current medications as directed. Please refer to the Current Medication list given to you today.  *If you need a refill on your cardiac medications before your next appointment, please call your pharmacy*   Lab Work: Keep your fasting lab appointment in October.  If you have labs (blood work) drawn today and your tests are completely normal, you will receive your results only by: [place] (if you have MyChart) OR A paper copy in the mail If you have any lab test that is abnormal or we need to change your treatment, we will call you to review the results.   Testing/Procedures: -NONE  Follow-Up: At [facility], you and your health needs are our priority.  As part of our continuing mission to provide you with exceptional heart care, we have created designated Provider Care Teams.  These Care Teams include your primary Cardiologist (physician) and Advanced Practice Providers (APPs -  Physician Assistants and Nurse Practitioners) who all work together to provide you with the care you need, when you need it.  We recommend signing up for the patient portal called "MyChart".  Sign up information is provided on this After Visit Summary.  MyChart is used to connect with patients for Virtual Visits (Telemedicine).  Patients are able to view lab/test results, encounter notes, upcoming appointments, etc.  Non-urgent messages can be sent to your provider as well.   To learn more about what you can do with MyChart, go to [email or web address].    Your next appointment:   6 month(s)  The format for your next appointment:   In Person  Provider:   Gwyndolyn Kaufman, MD   Other Instructions

## 2021-07-11 ENCOUNTER — Other Ambulatory Visit: Payer: 59

## 2021-07-11 ENCOUNTER — Other Ambulatory Visit: Payer: Self-pay

## 2021-07-11 DIAGNOSIS — Z8249 Family history of ischemic heart disease and other diseases of the circulatory system: Secondary | ICD-10-CM

## 2021-07-11 DIAGNOSIS — E785 Hyperlipidemia, unspecified: Secondary | ICD-10-CM

## 2021-07-11 DIAGNOSIS — Z79899 Other long term (current) drug therapy: Secondary | ICD-10-CM

## 2021-07-11 LAB — LIPID PANEL
Chol/HDL Ratio: 3.4 ratio (ref 0.0–5.0)
Cholesterol, Total: 148 mg/dL (ref 100–199)
HDL: 44 mg/dL (ref >39)
LDL Chol Calc (NIH): 85 mg/dL (ref 0–99)
Triglycerides: 101 mg/dL (ref 0–149)
VLDL Cholesterol Cal: 19 mg/dL (ref 5–40)

## 2021-08-23 ENCOUNTER — Encounter: Payer: Self-pay | Admitting: Emergency Medicine

## 2021-08-23 ENCOUNTER — Ambulatory Visit: Payer: 59 | Admitting: Emergency Medicine

## 2021-08-23 ENCOUNTER — Other Ambulatory Visit: Payer: Self-pay

## 2021-08-23 VITALS — BP 132/78 | HR 67 | Ht 70.0 in | Wt 258.0 lb

## 2021-08-23 DIAGNOSIS — R002 Palpitations: Secondary | ICD-10-CM

## 2021-08-23 DIAGNOSIS — E785 Hyperlipidemia, unspecified: Secondary | ICD-10-CM | POA: Diagnosis not present

## 2021-08-23 DIAGNOSIS — Z23 Encounter for immunization: Secondary | ICD-10-CM

## 2021-08-23 DIAGNOSIS — C61 Malignant neoplasm of prostate: Secondary | ICD-10-CM | POA: Diagnosis not present

## 2021-08-23 DIAGNOSIS — Z8546 Personal history of malignant neoplasm of prostate: Secondary | ICD-10-CM

## 2021-08-23 LAB — COMPREHENSIVE METABOLIC PANEL
ALT: 31 U/L (ref 0–53)
AST: 25 U/L (ref 0–37)
Albumin: 4.1 g/dL (ref 3.5–5.2)
Alkaline Phosphatase: 61 U/L (ref 39–117)
BUN: 15 mg/dL (ref 6–23)
CO2: 31 mEq/L (ref 19–32)
Calcium: 9.4 mg/dL (ref 8.4–10.5)
Chloride: 103 mEq/L (ref 96–112)
Creatinine, Ser: 1.07 mg/dL (ref 0.40–1.50)
GFR: 75.65 mL/min (ref >60.00)
Glucose, Bld: 97 mg/dL (ref 70–99)
Potassium: 4.4 mEq/L (ref 3.5–5.1)
Sodium: 138 mEq/L (ref 135–145)
Total Bilirubin: 1 mg/dL (ref 0.2–1.2)
Total Protein: 7.3 g/dL (ref 6.0–8.3)

## 2021-08-23 LAB — PSA: PSA: 0.06 ng/mL — ABNORMAL LOW (ref 0.10–4.00)

## 2021-08-23 NOTE — Assessment & Plan Note (Signed)
Stable.  No red flag signs or symptoms.  Normal vital signs.  Normotensive BP Readings from Last 3 Encounters:  08/23/21 132/78  05/17/21 130/70  02/21/21 112/66  Normal EKG.  Has a follow-up appointment with cardiologist next February.

## 2021-08-23 NOTE — Patient Instructions (Signed)

## 2021-08-23 NOTE — Assessment & Plan Note (Signed)
Stable.  Sees urologist on a regular basis. 

## 2021-08-23 NOTE — Progress Notes (Signed)
Christopher [name] Colon 60 y.o.   Chief Complaint  Patient presents with   Hyperlipidemia    6 month f/u    HISTORY OF PRESENT ILLNESS: This is a 60 y.o. male with history of dyslipidemia on rosuvastatin 10 mg daily here for follow-up. Also has history of prostate cancer status post radical prostatectomy.  Requesting PSA level. Also has history of occasional palpitations.  Recently seen by cardiologist. No other complaints or medical concerns today. Most recent labs reviewed with patient. Lab Results  Component Value Date   CHOL 148 07/11/2021   HDL 44 07/11/2021   LDLCALC 85 07/11/2021   TRIG 101 07/11/2021   CHOLHDL 3.4 07/11/2021   The 10-year ASCVD risk score (Arnett DK, et al., 2019) is: 7.6%   Values used to calculate the score:     Age: 72 years     Sex: Male     Is Non-Hispanic African American: No     Diabetic: No     Tobacco smoker: No     Systolic Blood Pressure: 825 mmHg     Is BP treated: No     HDL Cholesterol: 44 mg/dL     Total Cholesterol: 148 mg/dL   HPI   Prior to Admission medications   Medication Sig Start Date End Date Taking? Authorizing Provider  rosuvastatin (CRESTOR) 10 MG tablet Take 1 tablet (10 mg total) by mouth daily. 05/11/21  Yes Sueanne Margarita, MD    Allergies  Allergen Reactions   Asa Arthritis Strength-Antacid [Aspirin Buffered] Swelling   Penicillins Hives and Other (See Comments)    Did it involve swelling of the face/tongue/throat, SOB, or low BP? No Did it involve sudden or severe rash/hives, skin peeling, or any reaction on the inside of your mouth or nose? No Did you need to seek medical attention at a hospital or doctor's office? No When did it last happen?   childhood    If all above answers are "NO", may proceed with cephalosporin use.  Did it involve swelling of the face/tongue/throat, SOB, or low BP? No Did it involve sudden or severe rash/hives, skin peeling, or any reaction on the inside of your mouth or nose?  No Did you need to seek medical attention at a hospital or doctor's office? No When did it last happen?   childhood    If all above answers are "NO", may proceed with cephalosporin use.   Sulfa Antibiotics Hives    Patient Active Problem List   Diagnosis Date Noted   Transient hypertension 02/21/2021   Prostate cancer ([place]) 12/28/2019   Malignant neoplasm of prostate ([place]) 11/17/2019   Family history of premature CAD 01/13/2016   Hyperlipidemia 01/13/2016   Low testosterone in male 05/07/2014   Hearing impaired 12/17/2013    Past Medical History:  Diagnosis Date   Cancer ([place])    Phreesia 03/26/2020   GERD (gastroesophageal reflux disease) 10/31/2011   Hearing impaired 12/17/2013   lifelong    Hypogonadism male 10/31/2011   Prostate cancer ([place])    PVC (premature ventricular contraction)     Past Surgical History:  Procedure Laterality Date   LYMPHADENECTOMY N/A 12/28/2019   Procedure: LYMPHADENECTOMY;  Surgeon: Raynelle Bring, MD;  Location: [facility];  Service: Urology;  Laterality: N/A;   PROSTATE BIOPSY     PROSTATE SURGERY N/A    Phreesia 03/26/2020   ROBOT ASSISTED LAPAROSCOPIC RADICAL PROSTATECTOMY N/A 12/28/2019   Procedure: XI ROBOTIC ASSISTED LAPAROSCOPIC RADICAL PROSTATECTOMY LEVEL 2;  Surgeon: Alinda Money,  Wynetta Emery, MD;  Location: [facility];  Service: Urology;  Laterality: N/A;   TONSILLECTOMY      Social History   Socioeconomic History   Marital status: Married    Spouse name: Not on file   Number of children: 2   Years of education: Not on file   Highest education level: Not on file  Occupational History   Occupation: Dance movement psychotherapist    Employer: [employer]  Tobacco Use   Smoking status: Never   Smokeless tobacco: Never  Vaping Use   Vaping Use: Never used  Substance and Sexual Activity   Alcohol use: No   Drug use: No   Sexual activity: Yes  Other Topics Concern   Not on file  Social History Narrative   Married.  Education: Grade School.    Social Determinants of Health   Financial Resource Strain: Not on file  Food Insecurity: Not on file  Transportation Needs: Not on file  Physical Activity: Not on file  Stress: Not on file  Social Connections: Not on file  Intimate Partner Violence: Not on file    Family History  Problem Relation Age of Onset   Hyperlipidemia Father    Prostate cancer Father        treated with ADT and XRT   Heart disease Mother    Hyperlipidemia Mother    Thyroid cancer Sister    Breast cancer Neg Hx    Pancreatic cancer Neg Hx    Colon cancer Neg Hx      Review of Systems  Constitutional: Negative.  Negative for chills and fever.  HENT: Negative.  Negative for congestion and sore throat.   Respiratory: Negative.  Negative for cough and shortness of breath.   Cardiovascular:  Positive for palpitations. Negative for chest pain.  Gastrointestinal: Negative.  Negative for abdominal pain, diarrhea, nausea and vomiting.  Genitourinary: Negative.  Negative for dysuria and hematuria.  Skin: Negative.  Negative for rash.  Neurological: Negative.  Negative for dizziness and headaches.  All other systems reviewed and are negative.  Today's Vitals   08/23/21 0805  BP: 132/78  Pulse: 67  SpO2: 97%  Weight: 258 lb (117 kg)  Height: 5\' 10"  (1.778 m)   Body mass index is 37.02 kg/m. Wt Readings from Last 3 Encounters:  08/23/21 258 lb (117 kg)  05/17/21 241 lb 12.8 oz (109.7 kg)  02/21/21 260 lb 12.8 oz (118.3 kg)    Physical Exam Vitals reviewed.  Constitutional:      Appearance: Normal appearance.  HENT:     Head: Normocephalic.     Mouth/Throat:     Mouth: Mucous membranes are moist.     Pharynx: Oropharynx is clear.  Eyes:     Extraocular Movements: Extraocular movements intact.     Conjunctiva/sclera: Conjunctivae normal.     Pupils: Pupils are equal, round, and reactive to light.  Cardiovascular:     Rate and Rhythm: Normal rate and regular rhythm.     Pulses: Normal  pulses.     Heart sounds: Normal heart sounds.  Pulmonary:     Effort: Pulmonary effort is normal.     Breath sounds: Normal breath sounds.  Musculoskeletal:     Cervical back: Normal range of motion and neck supple. No tenderness.     Right lower leg: No edema.     Left lower leg: No edema.  Lymphadenopathy:     Cervical: No cervical adenopathy.  Skin:    General: Skin is warm  and dry.     Capillary Refill: Capillary refill takes less than 2 seconds.  Neurological:     General: No focal deficit present.     Mental Status: He is alert and oriented to person, place, and time.  Psychiatric:        Mood and Affect: Mood normal.        Behavior: Behavior normal.    EKG: Normal sinus rhythm with a ventricular rate of 65/min.  No acute ischemic changes.  Normal EKG. ASSESSMENT & PLAN: Problem List Items Addressed This Visit       Genitourinary   Prostate cancer ([place])    Stable.  Sees urologist on a regular basis.        Other   Dyslipidemia - Primary    Diet and nutrition discussed.  Normal lipid profile done 2 months ago.  Continue rosuvastatin 10 mg daily. The 10-year ASCVD risk score (Arnett DK, et al., 2019) is: 7.6%   Values used to calculate the score:     Age: 21 years     Sex: Male     Is Non-Hispanic African American: No     Diabetic: No     Tobacco smoker: No     Systolic Blood Pressure: 578 mmHg     Is BP treated: No     HDL Cholesterol: 44 mg/dL     Total Cholesterol: 148 mg/dL       Palpitations    Stable.  No red flag signs or symptoms.  Normal vital signs.  Normotensive BP Readings from Last 3 Encounters:  08/23/21 132/78  05/17/21 130/70  02/21/21 112/66  Normal EKG.  Has a follow-up appointment with cardiologist next February.       Relevant Orders   EKG 12-Lead   Comprehensive metabolic panel   Other Visit Diagnoses     Need for shingles vaccine       Relevant Orders   Varicella-zoster vaccine IM (Shingrix)   History of prostate cancer        Relevant Orders   PSA      Patient Instructions  Health Maintenance, Male Adopting a healthy lifestyle and getting preventive care are important in promoting health and wellness. Ask your health care provider about: The right schedule for you to have regular tests and exams. Things you can do on your own to prevent diseases and keep yourself healthy. What should I know about diet, weight, and exercise? Eat a healthy diet  Eat a diet that includes plenty of vegetables, fruits, low-fat dairy products, and lean protein. Do not eat a lot of foods that are high in solid fats, added sugars, or sodium. Maintain a healthy weight Body mass index (BMI) is a measurement that can be used to identify possible weight problems. It estimates body fat based on height and weight. Your health care provider can help determine your BMI and help you achieve or maintain a healthy weight. Get regular exercise Get regular exercise. This is one of the most important things you can do for your health. Most adults should: Exercise for at least 150 minutes each week. The exercise should increase your heart rate and make you sweat (moderate-intensity exercise). Do strengthening exercises at least twice a week. This is in addition to the moderate-intensity exercise. Spend less time sitting. Even light physical activity can be beneficial. Watch cholesterol and blood lipids Have your blood tested for lipids and cholesterol at 60 years of age, then have this test every 5 years.  You may need to have your cholesterol levels checked more often if: Your lipid or cholesterol levels are high. You are older than 60 years of age. You are at high risk for heart disease. What should I know about cancer screening? Many types of cancers can be detected early and may often be prevented. Depending on your health history and family history, you may need to have cancer screening at various ages. This may include screening  for: Colorectal cancer. Prostate cancer. Skin cancer. Lung cancer. What should I know about heart disease, diabetes, and high blood pressure? Blood pressure and heart disease High blood pressure causes heart disease and increases the risk of stroke. This is more likely to develop in people who have high blood pressure readings or are overweight. Talk with your health care provider about your target blood pressure readings. Have your blood pressure checked: Every 3-5 years if you are 55-40 years of age. Every year if you are 74 years old or older. If you are between the ages of 43 and 11 and are a current or former smoker, ask your health care provider if you should have a one-time screening for abdominal aortic aneurysm (AAA). Diabetes Have regular diabetes screenings. This checks your fasting blood sugar level. Have the screening done: Once every three years after age 30 if you are at a normal weight and have a low risk for diabetes. More often and at a younger age if you are overweight or have a high risk for diabetes. What should I know about preventing infection? Hepatitis B If you have a higher risk for hepatitis B, you should be screened for this virus. Talk with your health care provider to find out if you are at risk for hepatitis B infection. Hepatitis C Blood testing is recommended for: Everyone born from [birth year] through 1965. Anyone with known risk factors for hepatitis C. Sexually transmitted infections (STIs) You should be screened each year for STIs, including gonorrhea and chlamydia, if: You are sexually active and are younger than 60 years of age. You are older than 60 years of age and your health care provider tells you that you are at risk for this type of infection. Your sexual activity has changed since you were last screened, and you are at increased risk for chlamydia or gonorrhea. Ask your health care provider if you are at risk. Ask your health care provider about  whether you are at high risk for HIV. Your health care provider may recommend a prescription medicine to help prevent HIV infection. If you choose to take medicine to prevent HIV, you should first get tested for HIV. You should then be tested every 3 months for as long as you are taking the medicine. Follow these instructions at home: Alcohol use Do not drink alcohol if your health care provider tells you not to drink. If you drink alcohol: Limit how much you have to 0-2 drinks a day. Know how much alcohol is in your drink. In the [place] one drink equals one 12 oz bottle of beer (355 mL), one 5 oz glass of wine (148 mL), or one 1 oz glass of hard liquor (44 mL). Lifestyle Do not use any products that contain nicotine or tobacco. These products include cigarettes, chewing tobacco, and vaping devices, such as e-cigarettes. If you need help quitting, ask your health care provider. Do not use street drugs. Do not share needles. Ask your health care provider for help if you need support or information about  quitting drugs. General instructions Schedule regular health, dental, and eye exams. Stay current with your vaccines. Tell your health care provider if: You often feel depressed. You have ever been abused or do not feel safe at home. Summary Adopting a healthy lifestyle and getting preventive care are important in promoting health and wellness. Follow your health care provider's instructions about healthy diet, exercising, and getting tested or screened for diseases. Follow your health care provider's instructions on monitoring your cholesterol and blood pressure. This information is not intended to replace advice given to you by your health care provider. Make sure you discuss any questions you have with your health care provider. Document Revised: 01/23/2021 Document Reviewed: 01/23/2021 Elsevier Patient Education  2022 [place], MD [facility]

## 2021-08-23 NOTE — Assessment & Plan Note (Addendum)
Diet and nutrition discussed.  Normal lipid profile done 2 months ago.  Continue rosuvastatin 10 mg daily. The 10-year ASCVD risk score (Arnett DK, et al., 2019) is: 7.6%   Values used to calculate the score:     Age: 60 years     Sex: Male     Is Non-Hispanic African American: No     Diabetic: No     Tobacco smoker: No     Systolic Blood Pressure: 282 mmHg     Is BP treated: No     HDL Cholesterol: 44 mg/dL     Total Cholesterol: 148 mg/dL

## 2021-09-20 ENCOUNTER — Ambulatory Visit: Payer: 59 | Admitting: Dermatology

## 2021-10-29 NOTE — Progress Notes (Signed)
Cardiology Office Note:    Date:  10/31/2021   ID:  Christopher [name], DOB [birth year], MRN [id number]  PCP:  Horald Pollen, MD   [place] Prov[facility] Cardiologist:  Freada Bergeron, MD {  Referring MD: Horald Pollen, *    History of Present Illness:    Christopher [name] is a 61 y.o. male with a hx of prostate cancer and GERD who was previously followed by Dr. Meda Coffee who now returns to clinic for follow-up.  Initially saw Dr. Meda Coffee for palpitations, SOB and family history of CAD. Holter in 2017 showed rare PVCs and infrequent PACs. TTE 01/2016 with LVEF 60-65%, no WMA, no significant valve disease. Coronary CTA in 2019 with no significant CAD, Ca score 0.  Was seen in clinic on 01/2021 where he had gained 40lbs and was struggling with elevated blood pressure and cholesterol. He wanted to trial lifestyle modifications at that time. LDL came back 135 and was started on crestor.  Last seen in clinic by Richardson Dopp on 04/2021 where he was doing well from a CV standpoint.  Today, the patient states he lost weight but then it gained it back. Has been weight lifting 3-4x/week for 1 hour at a time. Has not been doing cardio and admits he needs to walk more frequently. No chest pain or SOB. Has been having more PVCs but not overly bothersome. Blood pressure has been controlled. Last LDL 85. Admits to cheating on his diet since starting the crestor.    Past Medical History:  Diagnosis Date   Cancer ([place])    Phreesia 03/26/2020   GERD (gastroesophageal reflux disease) 10/31/2011   Hearing impaired 12/17/2013   lifelong    Hypogonadism male 10/31/2011   Prostate cancer ([place])    PVC (premature ventricular contraction)     Past Surgical History:  Procedure Laterality Date   LYMPHADENECTOMY N/A 12/28/2019   Procedure: LYMPHADENECTOMY;  Surgeon: Raynelle Bring, MD;  Location: [facility];  Service: Urology;  Laterality: N/A;   PROSTATE BIOPSY     PROSTATE SURGERY  N/A    Phreesia 03/26/2020   ROBOT ASSISTED LAPAROSCOPIC RADICAL PROSTATECTOMY N/A 12/28/2019   Procedure: XI ROBOTIC ASSISTED LAPAROSCOPIC RADICAL PROSTATECTOMY LEVEL 2;  Surgeon: Raynelle Bring, MD;  Location: [facility];  Service: Urology;  Laterality: N/A;   TONSILLECTOMY      Current Medications: Current Meds  Medication Sig   [DISCONTINUED] rosuvastatin (CRESTOR) 10 MG tablet Take 1 tablet (10 mg total) by mouth daily.     Allergies:   Asa arthritis strength-antacid [aspirin buffered], Penicillins, and Sulfa antibiotics   Social History   Socioeconomic History   Marital status: Married    Spouse name: Not on file   Number of children: 2   Years of education: Not on file   Highest education level: Not on file  Occupational History   Occupation: Chemical engineer: [employer]  Tobacco Use   Smoking status: Never   Smokeless tobacco: Never  Vaping Use   Vaping Use: Never used  Substance and Sexual Activity   Alcohol use: No   Drug use: No   Sexual activity: Yes  Other Topics Concern   Not on file  Social History Narrative   Married.  Education: Grade School.   Social Determinants of Health   Financial Resource Strain: Not on file  Food Insecurity: Not on file  Transportation Needs: Not on file  Physical Activity: Not on file  Stress: Not  on file  Social Connections: Not on file     Family History: The patient's family history includes Heart disease in his mother; Hyperlipidemia in his father and mother; Prostate cancer in his father; Thyroid cancer in his sister. There is no history of Breast cancer, Pancreatic cancer, or [name] cancer.  ROS:   Please see the history of present illness.    Review of Systems  Constitutional:  Negative for fever and weight loss.  HENT:  Negative for sinus pain and tinnitus.   Eyes:  Negative for pain and discharge.  Respiratory:  Negative for cough, shortness of breath, wheezing and stridor.   Cardiovascular:   Positive for palpitations. Negative for chest pain, orthopnea, claudication, leg swelling and PND.  Gastrointestinal:  Negative for blood in stool, nausea and vomiting.  Genitourinary:  Negative for frequency and urgency.  Musculoskeletal:  Negative for back pain and myalgias.  Neurological:  Negative for dizziness and loss of consciousness.  Psychiatric/Behavioral:  Negative for depression.     EKGs/Labs/Other Studies Reviewed:    The following studies were reviewed today:  ETT: 02/2014 ETT Interpretation:  normal - no evidence of ischemia by ST analysis Comments: Patient presents today for routine GXT. Has strong FH for CAD - he is obese. Has had some dyspnea and palpitations.    Today the patient exercised on the standard Bruce protocol for a total of 9 minutes.  Good exercise tolerance.  Adequate blood pressure response.  Clinically negative for chest pain. Test was stopped due to achievement of target HR.  EKG negative for ischemia. No significant arrhythmia noted.    48-Holter monitor Rare PVCs, infrequent PACs. No arrhythmias or significant pauses identified. Normal Holter monitor, no therapy necessary.   Accessory Clinical Findings   Echocardiogram - 2009,Overall left ventricular systolic function was normal. Left ventricular ejection fraction was estimated to be 60 %. There were no left ventricular regional wall motion abnormalities. - Estimated peak pulmonary artery systolic pressure 24 mmHg.   TTE: 01/25/2016 - Left ventricle: The cavity size was normal. Wall thickness was   normal. Systolic function was normal. The estimated ejection   fraction was in the range of 60% to 65%. Wall motion was normal;   there were no regional wall motion abnormalities. Left   ventricular diastolic function parameters were normal. - Mitral valve: Mildly thickened leaflets . There was trivial   regurgitation. - Left atrium: The atrium was normal in size. - Right atrium: The atrium  was mildly dilated. - Inferior vena cava: The vessel was normal in size. The   respirophasic diameter changes were in the normal range (>= 50%),   consistent with normal central venous pressure.   Impressions: - LVEF 60-65%, normal wall thickness, normal wall motion, normal   diastolic function, normal LA size, mildly dilated RA, no   significant TR, normal IVC.      CCTA: 06/2018  1. Coronary calcium score of 0. This was 0 percentile for age and sex matched control.   2. Normal coronary origin with right dominance.   3. No evidence of CAD.   4.  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    EKG:   No new tracing today  Recent Labs: 02/21/2021: TSH 1.76 08/23/2021: ALT 31; BUN 15; Creatinine, Ser 1.07; Potassium 4.4; Sodium 138  Recent Lipid Panel    Component Value Date/Time   CHOL 148 07/11/2021 0837   TRIG 101 07/11/2021 0837   HDL 44 07/11/2021 0837   CHOLHDL 3.4  07/11/2021 0837   CHOLHDL 5 02/21/2021 0925   VLDL 13.4 02/21/2021 0925   LDLCALC 85 07/11/2021 0837     Risk Assessment/Calculations:       Physical Exam:    VS:  BP 112/60    Pulse 68    Ht 5\' 10"  (1.778 m)    Wt 259 lb 9.6 oz (117.8 kg)    SpO2 95%    BMI 37.25 kg/m     Wt Readings from Last 3 Encounters:  10/31/21 259 lb 9.6 oz (117.8 kg)  08/23/21 258 lb (117 kg)  05/17/21 241 lb 12.8 oz (109.7 kg)     GEN: Well nourished, well developed in no acute distress HEENT: Normal NECK: No JVD; No carotid bruits CARDIAC: RRR, 1/6 systolic murmur, no rubs, no gallops RESPIRATORY:  Clear to auscultation without rales, wheezing or rhonchi  ABDOMEN: Soft, non-tender, non-distended MUSCULOSKELETAL:  No edema; No deformity  SKIN: Warm and dry NEUROLOGIC:  Alert and oriented x 3 PSYCHIATRIC:  Normal affect   ASSESSMENT:    1. Essential hypertension   2. Medication management   3. Hyperlipidemia, unspecified hyperlipidemia type   4. Family history of premature CAD   5. Chest pain of  uncertain etiology   6. Obesity (BMI 35.0-39.9 without comorbidity)    PLAN:    In order of problems listed above:  #Hypertension: Controlled with lifestyle modifications. Not requiring medications. -Continue lifestyle efforts -Goal<120/80s  #Noncardiac Chest Pain: Coronary CTA negative for obstructive disease with Ca score 0. TTE with normal LVEF, no valve disease. -Continue lifestyle modifications -Continue crestor 10mg  daily -Repeat lipids today  #Palpitations: Cardiac monitor with rare PVCs and PACs, no significant arrthyhmias. Declined BB.  -Continue to monitor; if symptoms persist, can start BB  #HLD: Ca score 0 in 2019 with no significant disease on coronary CTA. LDL 135 after trial of lifestyle modifications. Now on crestor. -Continue crestor 10mg  daily -Repeat lipids today -Discussed lifestyle modifications at length  #Obesity with BMI 37: -Discussed diet and exercise at length as detailed below  Exercise recommendations: Goal of exercising for at least 30 minutes a day, at least 5 times per week.  Please exercise to a moderate exertion.  This means that while exercising it is difficult to speak in full sentences, however you are not so short of breath that you feel you must stop, and not so comfortable that you can carry on a full conversation.  Exertion level should be approximately a 5/10, if 10 is the most exertion you can perform.  Diet recommendations: Recommend a heart healthy diet such as the Mediterranean diet.  This diet consists of plant based foods, healthy fats, lean meats, olive oil.  It suggests limiting the intake of simple carbohydrates such as white breads, pastries, and pastas.  It also limits the amount of red meat, wine, and dairy products such as cheese that one should consume on a daily basis.   Medication Adjustments/Labs and Tests Ordered: Current medicines are reviewed at length with the patient today.  Concerns regarding medicines are  outlined above.  Orders Placed This Encounter  Procedures   Lipid Profile   Meds ordered this encounter  Medications   rosuvastatin (CRESTOR) 10 MG tablet    Sig: Take 1 tablet (10 mg total) by mouth daily.    Dispense:  90 tablet    Refill:  3    Patient Instructions  Medication Instructions:   Your physician recommends that you continue on your current medications as directed.  Please refer to the Current Medication list given to you today.  *If you need a refill on your cardiac medications before your next appointment, please call your pharmacy*   Lab Work:  TODAY--LIPIDS  If you have labs (blood work) drawn today and your tests are completely normal, you will receive your results only by: [place] (if you have MyChart) OR A paper copy in the mail If you have any lab test that is abnormal or we need to change your treatment, we will call you to review the results.   Follow-Up: At [facility], you and your health needs are our priority.  As part of our continuing mission to provide you with exceptional heart care, we have created designated Provider Care Teams.  These Care Teams include your primary Cardiologist (physician) and Advanced Practice Providers (APPs -  Physician Assistants and Nurse Practitioners) who all work together to provide you with the care you need, when you need it.  We recommend signing up for the patient portal called "MyChart".  Sign up information is provided on this After Visit Summary.  MyChart is used to connect with patients for Virtual Visits (Telemedicine).  Patients are able to view lab/test results, encounter notes, upcoming appointments, etc.  Non-urgent messages can be sent to your provider as well.   To learn more about what you can do with MyChart, go to [email or web address].    Your next appointment:   6 month(s)  The format for your next appointment:   In Person  Provider:   Freada Bergeron, MD {       Signed, Freada Bergeron, MD  10/31/2021 8:51 AM    [place]

## 2021-10-31 ENCOUNTER — Encounter: Payer: Self-pay | Admitting: Cardiology

## 2021-10-31 ENCOUNTER — Ambulatory Visit: Payer: 59 | Admitting: Cardiology

## 2021-10-31 ENCOUNTER — Other Ambulatory Visit: Payer: Self-pay

## 2021-10-31 VITALS — BP 112/60 | HR 68 | Ht 70.0 in | Wt 259.6 lb

## 2021-10-31 DIAGNOSIS — E669 Obesity, unspecified: Secondary | ICD-10-CM

## 2021-10-31 DIAGNOSIS — Z8249 Family history of ischemic heart disease and other diseases of the circulatory system: Secondary | ICD-10-CM

## 2021-10-31 DIAGNOSIS — Z79899 Other long term (current) drug therapy: Secondary | ICD-10-CM | POA: Diagnosis not present

## 2021-10-31 DIAGNOSIS — E785 Hyperlipidemia, unspecified: Secondary | ICD-10-CM

## 2021-10-31 DIAGNOSIS — R079 Chest pain, unspecified: Secondary | ICD-10-CM

## 2021-10-31 DIAGNOSIS — I10 Essential (primary) hypertension: Secondary | ICD-10-CM | POA: Diagnosis not present

## 2021-10-31 LAB — LIPID PANEL
Chol/HDL Ratio: 3.2 ratio (ref 0.0–5.0)
Cholesterol, Total: 146 mg/dL (ref 100–199)
HDL: 46 mg/dL (ref >39)
LDL Chol Calc (NIH): 82 mg/dL (ref 0–99)
Triglycerides: 96 mg/dL (ref 0–149)
VLDL Cholesterol Cal: 18 mg/dL (ref 5–40)

## 2021-10-31 MED ORDER — ROSUVASTATIN CALCIUM 10 MG PO TABS: 10.0000 mg | ORAL_TABLET | Freq: Every day | ORAL | 3 refills | Status: DC

## 2021-10-31 NOTE — Patient Instructions (Signed)
Medication Instructions:   Your physician recommends that you continue on your current medications as directed. Please refer to the Current Medication list given to you today.  *If you need a refill on your cardiac medications before your next appointment, please call your pharmacy*   Lab Work:  TODAY--LIPIDS  If you have labs (blood work) drawn today and your tests are completely normal, you will receive your results only by: [place] (if you have MyChart) OR A paper copy in the mail If you have any lab test that is abnormal or we need to change your treatment, we will call you to review the results.   Follow-Up: At [facility], you and your health needs are our priority.  As part of our continuing mission to provide you with exceptional heart care, we have created designated Provider Care Teams.  These Care Teams include your primary Cardiologist (physician) and Advanced Practice Providers (APPs -  Physician Assistants and Nurse Practitioners) who all work together to provide you with the care you need, when you need it.  We recommend signing up for the patient portal called "MyChart".  Sign up information is provided on this After Visit Summary.  MyChart is used to connect with patients for Virtual Visits (Telemedicine).  Patients are able to view lab/test results, encounter notes, upcoming appointments, etc.  Non-urgent messages can be sent to your provider as well.   To learn more about what you can do with MyChart, go to [email or web address].    Your next appointment:   6 month(s)  The format for your next appointment:   In Person  Provider:   Freada Bergeron, MD {

## 2021-12-05 ENCOUNTER — Other Ambulatory Visit: Payer: Self-pay

## 2021-12-05 ENCOUNTER — Encounter: Payer: Self-pay | Admitting: Dermatology

## 2021-12-05 ENCOUNTER — Ambulatory Visit: Payer: 59 | Admitting: Dermatology

## 2021-12-05 DIAGNOSIS — Z1283 Encounter for screening for malignant neoplasm of skin: Secondary | ICD-10-CM | POA: Diagnosis not present

## 2021-12-05 DIAGNOSIS — D239 Other benign neoplasm of skin, unspecified: Secondary | ICD-10-CM

## 2021-12-05 DIAGNOSIS — D2371 Other benign neoplasm of skin of right lower limb, including hip: Secondary | ICD-10-CM | POA: Diagnosis not present

## 2021-12-05 DIAGNOSIS — L719 Rosacea, unspecified: Secondary | ICD-10-CM

## 2021-12-05 DIAGNOSIS — L570 Actinic keratosis: Secondary | ICD-10-CM | POA: Diagnosis not present

## 2021-12-05 MED ORDER — IVERMECTIN 1 % EX CREA: 1.0000 "application " | TOPICAL_CREAM | Freq: Every evening | CUTANEOUS | 1 refills | Status: DC

## 2021-12-05 NOTE — Patient Instructions (Signed)
Pt call back if he cant get the soolantra cream and Per Dr. Denna Haggard he will call in another medicine.  ?

## 2021-12-19 ENCOUNTER — Encounter: Payer: Self-pay | Admitting: Dermatology

## 2021-12-19 NOTE — Progress Notes (Signed)
? ?  Follow-Up Visit ?  ?Subjective  ?Christopher [name] Colon is a 61 y.o. male who presents for the following: Annual Exam (Pt here for annual exam. ). ? ?Annual skin examination, crusts on face, rosacea. ?Location:  ?Duration:  ?Quality:  ?Associated Signs/Symptoms: ?Modifying Factors:  ?Severity:  ?Timing: ?Context:  ? ?Objective  ?Well appearing patient in no apparent distress; mood and affect are within normal limits. ?No atypical pigmented lesions or nonmelanoma skin cancer. ? ?Head - Anterior (Face) ?Active central facial inflammatory lesions. ? ?Right Dorsum of Foot ? ? ?Bluegray 3 mm macule, no dermoscopic atypia.  Dermoscopic photo taken.  Unfortunately image not focused. ? ? ? ? ?Left Malar Cheek, Right Forehead ?Gritty 3 mm hornlike crusts ? ? ? ?A full examination was performed including scalp, head, eyes, ears, nose, lips, neck, chest, axillae, abdomen, back, buttocks, bilateral upper extremities, bilateral lower extremities, hands, feet, fingers, toes, fingernails, and toenails. All findings within normal limits unless otherwise noted below.  Pes planus undergarment not examined. ? ? ?Assessment & Plan  ? ? ?Encounter for screening for malignant neoplasm of skin ? ?Annual skin examination.  Encouraged to self examine with spouse twice annually. ? ?Rosacea ?Head - Anterior (Face) ? ?If pt can't  get the soolantra, pt will call office and we call in minocycline ? ?Ivermectin (SOOLANTRA) 1 % CREA - Head - Anterior (Face) ?Apply 1 application. topically at bedtime. ? ?Blue nevus ?Right Dorsum of Foot ? ?Return for biopsy if there is any clinical change ? ?AK (actinic keratosis) (2) ?Left Malar Cheek; Right Forehead ? ?Destruction of lesion - Left Malar Cheek, Right Forehead ?Complexity: simple   ?Destruction method: cryotherapy   ?Informed consent: discussed and consent obtained   ?Timeout:  patient name, date of birth, surgical site, and procedure verified ?Lesion destroyed using liquid nitrogen: Yes    ?Cryotherapy cycles:  3 ?Outcome: patient tolerated procedure well with no complications   ?Post-procedure details: wound care instructions given   ? ? ? ? ? ?I, Lavonna Monarch, MD, have reviewed all documentation for this visit.  The documentation on 12/19/21 for the exam, diagnosis, procedures, and orders are all accurate and complete. ?

## 2022-02-22 ENCOUNTER — Encounter: Payer: Self-pay | Admitting: Emergency Medicine

## 2022-02-22 ENCOUNTER — Ambulatory Visit: Payer: 59 | Admitting: Emergency Medicine

## 2022-02-22 VITALS — BP 134/80 | HR 67 | Temp 98.4°F | Ht 70.0 in | Wt 264.0 lb

## 2022-02-22 DIAGNOSIS — E785 Hyperlipidemia, unspecified: Secondary | ICD-10-CM

## 2022-02-22 DIAGNOSIS — R030 Elevated blood-pressure reading, without diagnosis of hypertension: Secondary | ICD-10-CM | POA: Diagnosis not present

## 2022-02-22 DIAGNOSIS — Z808 Family history of malignant neoplasm of other organs or systems: Secondary | ICD-10-CM | POA: Insufficient documentation

## 2022-02-22 DIAGNOSIS — Z8546 Personal history of malignant neoplasm of prostate: Secondary | ICD-10-CM | POA: Diagnosis not present

## 2022-02-22 LAB — COMPREHENSIVE METABOLIC PANEL
ALT: 23 U/L (ref 0–53)
AST: 25 U/L (ref 0–37)
Albumin: 4.1 g/dL (ref 3.5–5.2)
Alkaline Phosphatase: 68 U/L (ref 39–117)
BUN: 16 mg/dL (ref 6–23)
CO2: 28 mEq/L (ref 19–32)
Calcium: 9.4 mg/dL (ref 8.4–10.5)
Chloride: 104 mEq/L (ref 96–112)
Creatinine, Ser: 1.15 mg/dL (ref 0.40–1.50)
GFR: 69.14 mL/min (ref >60.00)
Glucose, Bld: 103 mg/dL — ABNORMAL HIGH (ref 70–99)
Potassium: 4.4 mEq/L (ref 3.5–5.1)
Sodium: 138 mEq/L (ref 135–145)
Total Bilirubin: 1.2 mg/dL (ref 0.2–1.2)
Total Protein: 7.2 g/dL (ref 6.0–8.3)

## 2022-02-22 LAB — PSA: PSA: 0.06 ng/mL — ABNORMAL LOW (ref 0.10–4.00)

## 2022-02-22 NOTE — Assessment & Plan Note (Signed)
Stable.  Normal recent lipid profile. Continue rosuvastatin 10 mg daily. Diet and nutrition discussed. The 10-year ASCVD risk score (Arnett DK, et al., 2019) is: 7.4%   Values used to calculate the score:     Age: 61 years     Sex: Male     Is Non-Hispanic African American: No     Diabetic: No     Tobacco smoker: No     Systolic Blood Pressure: 005 mmHg     Is BP treated: No     HDL Cholesterol: 46 mg/dL     Total Cholesterol: 146 mg/dL

## 2022-02-22 NOTE — Patient Instructions (Signed)
Health Maintenance, Male Adopting a healthy lifestyle and getting preventive care are important in promoting health and wellness. Ask your health care provider about: The right schedule for you to have regular tests and exams. Things you can do on your own to prevent diseases and keep yourself healthy. What should I know about diet, weight, and exercise? Eat a healthy diet  Eat a diet that includes plenty of vegetables, fruits, low-fat dairy products, and lean protein. Do not eat a lot of foods that are high in solid fats, added sugars, or sodium. Maintain a healthy weight Body mass index (BMI) is a measurement that can be used to identify possible weight problems. It estimates body fat based on height and weight. Your health care provider can help determine your BMI and help you achieve or maintain a healthy weight. Get regular exercise Get regular exercise. This is one of the most important things you can do for your health. Most adults should: Exercise for at least 150 minutes each week. The exercise should increase your heart rate and make you sweat (moderate-intensity exercise). Do strengthening exercises at least twice a week. This is in addition to the moderate-intensity exercise. Spend less time sitting. Even light physical activity can be beneficial. Watch cholesterol and blood lipids Have your blood tested for lipids and cholesterol at 61 years of age, then have this test every 5 years. You may need to have your cholesterol levels checked more often if: Your lipid or cholesterol levels are high. You are older than 61 years of age. You are at high risk for heart disease. What should I know about cancer screening? Many types of cancers can be detected early and may often be prevented. Depending on your health history and family history, you may need to have cancer screening at various ages. This may include screening for: Colorectal cancer. Prostate cancer. Skin cancer. Lung  cancer. What should I know about heart disease, diabetes, and high blood pressure? Blood pressure and heart disease High blood pressure causes heart disease and increases the risk of stroke. This is more likely to develop in people who have high blood pressure readings or are overweight. Talk with your health care provider about your target blood pressure readings. Have your blood pressure checked: Every 3-5 years if you are 18-39 years of age. Every year if you are 40 years old or older. If you are between the ages of 65 and 75 and are a current or former smoker, ask your health care provider if you should have a one-time screening for abdominal aortic aneurysm (AAA). Diabetes Have regular diabetes screenings. This checks your fasting blood sugar level. Have the screening done: Once every three years after age 45 if you are at a normal weight and have a low risk for diabetes. More often and at a younger age if you are overweight or have a high risk for diabetes. What should I know about preventing infection? Hepatitis B If you have a higher risk for hepatitis B, you should be screened for this virus. Talk with your health care provider to find out if you are at risk for hepatitis B infection. Hepatitis C Blood testing is recommended for: Everyone born from 1945 through 1965. Anyone with known risk factors for hepatitis C. Sexually transmitted infections (STIs) You should be screened each year for STIs, including gonorrhea and chlamydia, if: You are sexually active and are younger than 61 years of age. You are older than 61 years of age and your   health care provider tells you that you are at risk for this type of infection. Your sexual activity has changed since you were last screened, and you are at increased risk for chlamydia or gonorrhea. Ask your health care provider if you are at risk. Ask your health care provider about whether you are at high risk for HIV. Your health care provider  may recommend a prescription medicine to help prevent HIV infection. If you choose to take medicine to prevent HIV, you should first get tested for HIV. You should then be tested every 3 months for as long as you are taking the medicine. Follow these instructions at home: Alcohol use Do not drink alcohol if your health care provider tells you not to drink. If you drink alcohol: Limit how much you have to 0-2 drinks a day. Know how much alcohol is in your drink. In the [place] one drink equals one 12 oz bottle of beer (355 mL), one 5 oz glass of wine (148 mL), or one 1 oz glass of hard liquor (44 mL). Lifestyle Do not use any products that contain nicotine or tobacco. These products include cigarettes, chewing tobacco, and vaping devices, such as e-cigarettes. If you need help quitting, ask your health care provider. Do not use street drugs. Do not share needles. Ask your health care provider for help if you need support or information about quitting drugs. General instructions Schedule regular health, dental, and eye exams. Stay current with your vaccines. Tell your health care provider if: You often feel depressed. You have ever been abused or do not feel safe at home. Summary Adopting a healthy lifestyle and getting preventive care are important in promoting health and wellness. Follow your health care provider's instructions about healthy diet, exercising, and getting tested or screened for diseases. Follow your health care provider's instructions on monitoring your cholesterol and blood pressure. This information is not intended to replace advice given to you by your health care provider. Make sure you discuss any questions you have with your health care provider. Document Revised: 01/23/2021 Document Reviewed: 01/23/2021 Elsevier Patient Education  2023 [employer] Inc.  

## 2022-02-22 NOTE — Assessment & Plan Note (Signed)
Asymptomatic.  Thyroid panel done today.  Benign clinical examination.

## 2022-02-22 NOTE — Progress Notes (Signed)
Christopher [name] Colon 61 y.o.   Chief Complaint  Patient presents with   Follow-up    No concerns    HISTORY OF PRESENT ILLNESS: This is a 61 y.o. male with history of dyslipidemia and prostate cancer here for follow-up. Doing well.  Recently retired. Has no complaints or medical concerns today.  HPI   Prior to Admission medications   Medication Sig Start Date End Date Taking? Authorizing Provider  rosuvastatin (CRESTOR) 10 MG tablet Take 1 tablet (10 mg total) by mouth daily. 10/31/21  Yes Freada Bergeron, MD    Allergies  Allergen Reactions   Diona Fanti Arthritis Strength-Antacid [Aspirin Buffered] Swelling   Penicillins Hives and Other (See Comments)    Did it involve swelling of the face/tongue/throat, SOB, or low BP? No Did it involve sudden or severe rash/hives, skin peeling, or any reaction on the inside of your mouth or nose? No Did you need to seek medical attention at a hospital or doctor's office? No When did it last happen?   childhood    If all above answers are "NO", may proceed with cephalosporin use.  Did it involve swelling of the face/tongue/throat, SOB, or low BP? No Did it involve sudden or severe rash/hives, skin peeling, or any reaction on the inside of your mouth or nose? No Did you need to seek medical attention at a hospital or doctor's office? No When did it last happen?   childhood    If all above answers are "NO", may proceed with cephalosporin use.   Sulfa Antibiotics Hives    Patient Active Problem List   Diagnosis Date Noted   Transient hypertension 02/21/2021   Prostate cancer ([place]) 12/28/2019   Malignant neoplasm of prostate ([place]) 11/17/2019   Family history of premature CAD 01/13/2016   Dyslipidemia 01/13/2016   Palpitations 01/13/2016   Low testosterone in male 05/07/2014   Hearing impaired 12/17/2013    Past Medical History:  Diagnosis Date   Cancer ([place])    Phreesia 03/26/2020   GERD (gastroesophageal reflux disease)  10/31/2011   Hearing impaired 12/17/2013   lifelong    Hypogonadism male 10/31/2011   Prostate cancer ([place])    PVC (premature ventricular contraction)     Past Surgical History:  Procedure Laterality Date   LYMPHADENECTOMY N/A 12/28/2019   Procedure: LYMPHADENECTOMY;  Surgeon: Raynelle Bring, MD;  Location: [facility];  Service: Urology;  Laterality: N/A;   PROSTATE BIOPSY     PROSTATE SURGERY N/A    Phreesia 03/26/2020   ROBOT ASSISTED LAPAROSCOPIC RADICAL PROSTATECTOMY N/A 12/28/2019   Procedure: XI ROBOTIC ASSISTED LAPAROSCOPIC RADICAL PROSTATECTOMY LEVEL 2;  Surgeon: Raynelle Bring, MD;  Location: [facility];  Service: Urology;  Laterality: N/A;   TONSILLECTOMY      Social History   Socioeconomic History   Marital status: Married    Spouse name: Not on file   Number of children: 2   Years of education: Not on file   Highest education level: Not on file  Occupational History   Occupation: Dance movement psychotherapist    Employer: [employer]  Tobacco Use   Smoking status: Never   Smokeless tobacco: Never  Vaping Use   Vaping Use: Never used  Substance and Sexual Activity   Alcohol use: No   Drug use: No   Sexual activity: Yes  Other Topics Concern   Not on file  Social History Narrative   Married.  Education: Grade School.   Social Determinants of Radio broadcast assistant  Strain: Not on file  Food Insecurity: Not on file  Transportation Needs: Not on file  Physical Activity: Not on file  Stress: Not on file  Social Connections: Not on file  Intimate Partner Violence: Not on file    Family History  Problem Relation Age of Onset   Hyperlipidemia Father    Prostate cancer Father        treated with ADT and XRT   Heart disease Mother    Hyperlipidemia Mother    Thyroid cancer Sister    Breast cancer Neg Hx    Pancreatic cancer Neg Hx    Colon cancer Neg Hx      Review of Systems  Constitutional: Negative.  Negative for chills and fever.  HENT: Negative.   Negative for congestion and sore throat.   Respiratory: Negative.  Negative for cough and shortness of breath.   Cardiovascular:  Positive for palpitations. Negative for chest pain.  Gastrointestinal:  Negative for blood in stool, melena, nausea and vomiting.  Genitourinary:  Negative for dysuria and hematuria.  Skin: Negative.  Negative for rash.  Neurological: Negative.  Negative for dizziness and headaches.   Today's Vitals   02/22/22 0811  BP: 134/80  Pulse: 67  Temp: 98.4 F (36.9 C)  TempSrc: Other (Comment)  SpO2: 95%  Weight: 264 lb (119.7 kg)  Height: '5\' 10"'$  (1.778 m)   Body mass index is 37.88 kg/m.   Physical Exam Vitals reviewed.  Constitutional:      Appearance: Normal appearance.  HENT:     Head: Normocephalic.     Mouth/Throat:     Mouth: Mucous membranes are moist.     Pharynx: Oropharynx is clear.  Eyes:     Extraocular Movements: Extraocular movements intact.     Conjunctiva/sclera: Conjunctivae normal.     Pupils: Pupils are equal, round, and reactive to light.  Cardiovascular:     Rate and Rhythm: Normal rate and regular rhythm.     Pulses: Normal pulses.     Heart sounds: Normal heart sounds.  Pulmonary:     Effort: Pulmonary effort is normal.     Breath sounds: Normal breath sounds.  Abdominal:     Palpations: Abdomen is soft.     Tenderness: There is no abdominal tenderness.  Musculoskeletal:     Cervical back: No tenderness.     Right lower leg: No edema.     Left lower leg: No edema.  Lymphadenopathy:     Cervical: No cervical adenopathy.  Skin:    General: Skin is warm and dry.     Capillary Refill: Capillary refill takes less than 2 seconds.  Neurological:     General: No focal deficit present.     Mental Status: He is alert and oriented to person, place, and time.  Psychiatric:        Mood and Affect: Mood normal.        Behavior: Behavior normal.      ASSESSMENT & PLAN: Problem List Items Addressed This Visit        Cardiovascular and Mediastinum   Transient hypertension    Well-controlled hypertension on no medications.  No concerns. Dietary approaches to prevent hypertension discussed. Patient has excellent lifestyle habits. Exercises regularly.        Other   Dyslipidemia - Primary    Stable.  Normal recent lipid profile. Continue rosuvastatin 10 mg daily. Diet and nutrition discussed. The 10-year ASCVD risk score (Arnett DK, et al., 2019) is: 7.4%  Values used to calculate the score:     Age: 26 years     Sex: Male     Is Non-Hispanic African American: No     Diabetic: No     Tobacco smoker: No     Systolic Blood Pressure: 734 mmHg     Is BP treated: No     HDL Cholesterol: 46 mg/dL     Total Cholesterol: 146 mg/dL       Relevant Orders   Comprehensive metabolic panel   Hemoglobin A1c   Family history of thyroid cancer    Asymptomatic.  Thyroid panel done today.  Benign clinical examination.      Relevant Orders   Thyroid Panel With TSH   History of prostate cancer    Doing well.  Stable. Unable to hold urine for too long but not affecting quality of life yet. Will follow-up with urologist as scheduled.      Relevant Orders   PSA   Patient Instructions  Health Maintenance, Male Adopting a healthy lifestyle and getting preventive care are important in promoting health and wellness. Ask your health care provider about: The right schedule for you to have regular tests and exams. Things you can do on your own to prevent diseases and keep yourself healthy. What should I know about diet, weight, and exercise? Eat a healthy diet  Eat a diet that includes plenty of vegetables, fruits, low-fat dairy products, and lean protein. Do not eat a lot of foods that are high in solid fats, added sugars, or sodium. Maintain a healthy weight Body mass index (BMI) is a measurement that can be used to identify possible weight problems. It estimates body fat based on height and weight. Your  health care provider can help determine your BMI and help you achieve or maintain a healthy weight. Get regular exercise Get regular exercise. This is one of the most important things you can do for your health. Most adults should: Exercise for at least 150 minutes each week. The exercise should increase your heart rate and make you sweat (moderate-intensity exercise). Do strengthening exercises at least twice a week. This is in addition to the moderate-intensity exercise. Spend less time sitting. Even light physical activity can be beneficial. Watch cholesterol and blood lipids Have your blood tested for lipids and cholesterol at 61 years of age, then have this test every 5 years. You may need to have your cholesterol levels checked more often if: Your lipid or cholesterol levels are high. You are older than 61 years of age. You are at high risk for heart disease. What should I know about cancer screening? Many types of cancers can be detected early and may often be prevented. Depending on your health history and family history, you may need to have cancer screening at various ages. This may include screening for: Colorectal cancer. Prostate cancer. Skin cancer. Lung cancer. What should I know about heart disease, diabetes, and high blood pressure? Blood pressure and heart disease High blood pressure causes heart disease and increases the risk of stroke. This is more likely to develop in people who have high blood pressure readings or are overweight. Talk with your health care provider about your target blood pressure readings. Have your blood pressure checked: Every 3-5 years if you are 2-69 years of age. Every year if you are 12 years old or older. If you are between the ages of 31 and 98 and are a current or former smoker, ask your health  care provider if you should have a one-time screening for abdominal aortic aneurysm (AAA). Diabetes Have regular diabetes screenings. This checks  your fasting blood sugar level. Have the screening done: Once every three years after age 60 if you are at a normal weight and have a low risk for diabetes. More often and at a younger age if you are overweight or have a high risk for diabetes. What should I know about preventing infection? Hepatitis B If you have a higher risk for hepatitis B, you should be screened for this virus. Talk with your health care provider to find out if you are at risk for hepatitis B infection. Hepatitis C Blood testing is recommended for: Everyone born from [birth year] through 1965. Anyone with known risk factors for hepatitis C. Sexually transmitted infections (STIs) You should be screened each year for STIs, including gonorrhea and chlamydia, if: You are sexually active and are younger than 61 years of age. You are older than 61 years of age and your health care provider tells you that you are at risk for this type of infection. Your sexual activity has changed since you were last screened, and you are at increased risk for chlamydia or gonorrhea. Ask your health care provider if you are at risk. Ask your health care provider about whether you are at high risk for HIV. Your health care provider may recommend a prescription medicine to help prevent HIV infection. If you choose to take medicine to prevent HIV, you should first get tested for HIV. You should then be tested every 3 months for as long as you are taking the medicine. Follow these instructions at home: Alcohol use Do not drink alcohol if your health care provider tells you not to drink. If you drink alcohol: Limit how much you have to 0-2 drinks a day. Know how much alcohol is in your drink. In the [place] one drink equals one 12 oz bottle of beer (355 mL), one 5 oz glass of wine (148 mL), or one 1 oz glass of hard liquor (44 mL). Lifestyle Do not use any products that contain nicotine or tobacco. These products include cigarettes, chewing tobacco, and  vaping devices, such as e-cigarettes. If you need help quitting, ask your health care provider. Do not use street drugs. Do not share needles. Ask your health care provider for help if you need support or information about quitting drugs. General instructions Schedule regular health, dental, and eye exams. Stay current with your vaccines. Tell your health care provider if: You often feel depressed. You have ever been abused or do not feel safe at home. Summary Adopting a healthy lifestyle and getting preventive care are important in promoting health and wellness. Follow your health care provider's instructions about healthy diet, exercising, and getting tested or screened for diseases. Follow your health care provider's instructions on monitoring your cholesterol and blood pressure. This information is not intended to replace advice given to you by your health care provider. Make sure you discuss any questions you have with your health care provider. Document Revised: 01/23/2021 Document Reviewed: 01/23/2021 Elsevier Patient Education  [place], MD [facility]

## 2022-02-22 NOTE — Assessment & Plan Note (Signed)
Well-controlled hypertension on no medications.  No concerns. Dietary approaches to prevent hypertension discussed. Patient has excellent lifestyle habits. Exercises regularly.

## 2022-02-22 NOTE — Assessment & Plan Note (Signed)
Doing well.  Stable. Unable to hold urine for too long but not affecting quality of life yet. Will follow-up with urologist as scheduled.

## 2022-02-23 LAB — THYROID PANEL WITH TSH
Free Thyroxine Index: 2.2 (ref 1.4–3.8)
T3 Uptake: 30 % (ref 22–35)
T4, Total: 7.4 ug/dL (ref 4.9–10.5)
TSH: 2.49 mIU/L (ref 0.40–4.50)

## 2022-02-23 LAB — HEMOGLOBIN A1C
Est. average glucose Bld gHb Est-mCnc: 120 mg/dL
Hgb A1c MFr Bld: 5.8 % — ABNORMAL HIGH (ref 4.8–5.6)

## 2022-04-09 ENCOUNTER — Encounter: Payer: Self-pay | Admitting: Emergency Medicine

## 2022-04-09 ENCOUNTER — Ambulatory Visit: Payer: 59 | Admitting: Emergency Medicine

## 2022-04-09 VITALS — BP 130/72 | HR 65 | Temp 98.3°F | Ht 70.0 in | Wt 251.1 lb

## 2022-04-09 DIAGNOSIS — K429 Umbilical hernia without obstruction or gangrene: Secondary | ICD-10-CM | POA: Insufficient documentation

## 2022-04-09 NOTE — Patient Instructions (Signed)
Umbilical Hernia, Adult  A hernia is a bulge of tissue that pushes through an opening between muscles. An umbilical hernia happens in the abdomen, near the belly button (umbilicus). The hernia may contain tissues from the small intestine, large intestine, or fatty tissue covering the intestines. Umbilical hernias in adults tend to get worse over time, and they require surgical treatment. There are different types of umbilical hernias, including: Indirect hernia. This type is located just above or below the umbilicus. It is the most common type of umbilical hernia in adults. Direct hernia. This type forms through an opening formed by the umbilicus. Reducible hernia. This type of hernia comes and goes. It may be visible only when you strain, lift something heavy, or cough. This type of hernia can be pushed back into the abdomen (reduced). Incarcerated hernia. This type traps abdominal tissue inside the hernia. This type of hernia cannot be reduced. Strangulated hernia. This type of hernia cuts off blood flow to the tissues inside the hernia. The tissues can start to die if this happens. This type of hernia requires emergency treatment. What are the causes? An umbilical hernia happens when tissue inside the abdomen presses on a weak area of the abdominal muscles. What increases the risk? You may have a greater risk of this condition if you: Are obese. Have had several pregnancies. Have a buildup of fluid inside your abdomen. Have had surgery that weakens the abdominal muscles. What are the signs or symptoms? The main symptom of this condition is a painless bulge at or near the belly button. A reducible hernia may be visible only when you strain, lift something heavy, or cough. Other symptoms may include: Dull pain. A feeling of pressure. Symptoms of a strangulated hernia may include: Pain that gets increasingly worse. Nausea and vomiting. Pain when pressing on the hernia. Skin over the hernia  becoming red or purple. Constipation. Blood in the stool. How is this diagnosed? This condition may be diagnosed based on: A physical exam. You may be asked to cough or strain while standing. These actions increase the pressure inside your abdomen and can force the hernia through the opening in your muscles. Your health care provider may try to reduce the hernia by pressing on it. Your symptoms and medical history. How is this treated? Surgery is the only treatment for an umbilical hernia. Surgery for a strangulated hernia is done as soon as possible. If you have a small hernia that is not incarcerated, you may need to lose weight before having surgery. Follow these instructions at home: Lose weight, if told by your health care provider. Do not try to push the hernia back in. Watch your hernia for any changes in color or size. Tell your health care provider if any changes occur. You may need to avoid activities that increase pressure on your hernia. Do not lift anything that is heavier than 10 lb (4.5 kg), or the limit that you are told, until your health care provider says that it is safe. Take over-the-counter and prescription medicines only as told by your health care provider. Keep all follow-up visits. This is important. Contact a health care provider if: Your hernia gets larger. Your hernia becomes painful. Get help right away if: You develop sudden, severe pain near the area of your hernia. You have pain as well as nausea or vomiting. You have pain and the skin over your hernia changes color. You develop a fever or chills. Summary A hernia is a bulge of   tissue that pushes through an opening between muscles. An umbilical hernia happens near the belly button. Surgery is the only treatment for an umbilical hernia. Do not try to push your hernia back in. Keep all follow-up visits. This is important. This information is not intended to replace advice given to you by your health care  provider. Make sure you discuss any questions you have with your health care provider. Document Revised: 04/11/2020 Document Reviewed: 04/11/2020 Elsevier Patient Education  2023 [employer]  

## 2022-04-09 NOTE — Progress Notes (Signed)
Christopher [name] Colon 61 y.o.   Chief Complaint  Patient presents with   Abdominal Pain    X 2 weeks , mid abd pain , sharp , feels a bump near belly button     HISTORY OF PRESENT ILLNESS: This is a 61 y.o. male complaining of possible hernia he noticed 2 weeks ago while weightlifting.  Denies nausea or vomiting.  Able to eat and drink.  Denies fever or chills.  Abdominal Pain Pertinent negatives include no fever or headaches.     Prior to Admission medications   Medication Sig Start Date End Date Taking? Authorizing Provider  rosuvastatin (CRESTOR) 10 MG tablet Take 1 tablet (10 mg total) by mouth daily. 10/31/21  Yes Freada Bergeron, MD    Allergies  Allergen Reactions   Diona Fanti Arthritis Strength-Antacid [Aspirin Buffered] Swelling   Penicillins Hives and Other (See Comments)    Did it involve swelling of the face/tongue/throat, SOB, or low BP? No Did it involve sudden or severe rash/hives, skin peeling, or any reaction on the inside of your mouth or nose? No Did you need to seek medical attention at a hospital or doctor's office? No When did it last happen?   childhood    If all above answers are "NO", may proceed with cephalosporin use.  Did it involve swelling of the face/tongue/throat, SOB, or low BP? No Did it involve sudden or severe rash/hives, skin peeling, or any reaction on the inside of your mouth or nose? No Did you need to seek medical attention at a hospital or doctor's office? No When did it last happen?   childhood    If all above answers are "NO", may proceed with cephalosporin use.   Sulfa Antibiotics Hives    Patient Active Problem List   Diagnosis Date Noted   Family history of thyroid cancer 02/22/2022   History of prostate cancer 02/22/2022   Transient hypertension 02/21/2021   Prostate cancer ([place]) 12/28/2019   Malignant neoplasm of prostate ([place]) 11/17/2019   Family history of premature CAD 01/13/2016   Dyslipidemia 01/13/2016    Palpitations 01/13/2016   Low testosterone in male 05/07/2014   Hearing impaired 12/17/2013    Past Medical History:  Diagnosis Date   Cancer ([place])    Phreesia 03/26/2020   GERD (gastroesophageal reflux disease) 10/31/2011   Hearing impaired 12/17/2013   lifelong    Hypogonadism male 10/31/2011   Prostate cancer ([place])    PVC (premature ventricular contraction)     Past Surgical History:  Procedure Laterality Date   LYMPHADENECTOMY N/A 12/28/2019   Procedure: LYMPHADENECTOMY;  Surgeon: Raynelle Bring, MD;  Location: [facility];  Service: Urology;  Laterality: N/A;   PROSTATE BIOPSY     PROSTATE SURGERY N/A    Phreesia 03/26/2020   ROBOT ASSISTED LAPAROSCOPIC RADICAL PROSTATECTOMY N/A 12/28/2019   Procedure: XI ROBOTIC ASSISTED LAPAROSCOPIC RADICAL PROSTATECTOMY LEVEL 2;  Surgeon: Raynelle Bring, MD;  Location: [facility];  Service: Urology;  Laterality: N/A;   TONSILLECTOMY      Social History   Socioeconomic History   Marital status: Married    Spouse name: Not on file   Number of children: 2   Years of education: Not on file   Highest education level: Not on file  Occupational History   Occupation: Dance movement psychotherapist    Employer: [employer]  Tobacco Use   Smoking status: Never   Smokeless tobacco: Never  Vaping Use   Vaping Use: Never used  Substance and Sexual  Activity   Alcohol use: No   Drug use: No   Sexual activity: Yes  Other Topics Concern   Not on file  Social History Narrative   Married.  Education: Grade School.   Social Determinants of Health   Financial Resource Strain: Not on file  Food Insecurity: Not on file  Transportation Needs: Not on file  Physical Activity: Not on file  Stress: Not on file  Social Connections: Not on file  Intimate Partner Violence: Not on file    Family History  Problem Relation Age of Onset   Hyperlipidemia Father    Prostate cancer Father        treated with ADT and XRT   Heart disease Mother    Hyperlipidemia  Mother    Thyroid cancer Sister    Breast cancer Neg Hx    Pancreatic cancer Neg Hx    Colon cancer Neg Hx      Review of Systems  Constitutional: Negative.  Negative for fever.  HENT: Negative.  Negative for congestion and sore throat.   Respiratory: Negative.  Negative for cough and shortness of breath.   Cardiovascular: Negative.  Negative for chest pain and palpitations.  Gastrointestinal:  Positive for abdominal pain.  Genitourinary: Negative.   Skin: Negative.  Negative for rash.  Neurological:  Negative for dizziness and headaches.  All other systems reviewed and are negative.  Today's Vitals   04/09/22 1507  BP: 130/72  Pulse: 65  Temp: 98.3 F (36.8 C)  TempSrc: Oral  SpO2: 99%  Weight: 251 lb 2 oz (113.9 kg)  Height: '5\' 10"'$  (1.778 m)   Body mass index is 36.03 kg/m.   Physical Exam Vitals reviewed.  Constitutional:      Appearance: Normal appearance.  HENT:     Head: Normocephalic.  Eyes:     Extraocular Movements: Extraocular movements intact.  Cardiovascular:     Rate and Rhythm: Normal rate and regular rhythm.     Pulses: Normal pulses.     Heart sounds: Normal heart sounds.  Pulmonary:     Effort: Pulmonary effort is normal.     Breath sounds: Normal breath sounds.  Abdominal:     General: There is no distension.     Tenderness: There is no abdominal tenderness.     Hernia: A hernia (Umbilical hernia) is present.  Musculoskeletal:     Cervical back: No tenderness.     Right lower leg: No edema.     Left lower leg: No edema.  Lymphadenopathy:     Cervical: No cervical adenopathy.  Skin:    General: Skin is warm and dry.     Capillary Refill: Capillary refill takes less than 2 seconds.  Neurological:     General: No focal deficit present.     Mental Status: He is alert and oriented to person, place, and time.  Psychiatric:        Mood and Affect: Mood normal.        Behavior: Behavior normal.      ASSESSMENT & PLAN: A total of  35-minute was spent with the patient and counseling/coordination of care regarding preparing for this visit, review of most recent office visit notes, diagnosis of hernia and review of treatment, need for ultrasound, possible general surgery referral, prognosis, documentation, need for follow-up.  Problem List Items Addressed This Visit       Other   Umbilical hernia without obstruction and without gangrene - Primary    Clinical stable.  No red flag signs or symptoms. Small umbilical/incisional hernia.  Not tender to palpation. We will confirm with ultrasound and refer to general surgeon as needed or as requested by patient.      Relevant Orders   US Abdomen Limited   Patient Instructions  Umbilical Hernia, Adult  A hernia is a bulge of tissue that pushes through an opening between muscles. An umbilical hernia happens in the abdomen, near the belly button (umbilicus). The hernia may contain tissues from the small intestine, large intestine, or fatty tissue covering the intestines. Umbilical hernias in adults tend to get worse over time, and they require surgical treatment. There are different types of umbilical hernias, including: Indirect hernia. This type is located just above or below the umbilicus. It is the most common type of umbilical hernia in adults. Direct hernia. This type forms through an opening formed by the umbilicus. Reducible hernia. This type of hernia comes and goes. It may be visible only when you strain, lift something heavy, or cough. This type of hernia can be pushed back into the abdomen (reduced). Incarcerated hernia. This type traps abdominal tissue inside the hernia. This type of hernia cannot be reduced. Strangulated hernia. This type of hernia cuts off blood flow to the tissues inside the hernia. The tissues can start to die if this happens. This type of hernia requires emergency treatment. What are the causes? An umbilical hernia happens when tissue inside the  abdomen presses on a weak area of the abdominal muscles. What increases the risk? You may have a greater risk of this condition if you: Are obese. Have had several pregnancies. Have a buildup of fluid inside your abdomen. Have had surgery that weakens the abdominal muscles. What are the signs or symptoms? The main symptom of this condition is a painless bulge at or near the belly button. A reducible hernia may be visible only when you strain, lift something heavy, or cough. Other symptoms may include: Dull pain. A feeling of pressure. Symptoms of a strangulated hernia may include: Pain that gets increasingly worse. Nausea and vomiting. Pain when pressing on the hernia. Skin over the hernia becoming red or purple. Constipation. Blood in the stool. How is this diagnosed? This condition may be diagnosed based on: A physical exam. You may be asked to cough or strain while standing. These actions increase the pressure inside your abdomen and can force the hernia through the opening in your muscles. Your health care provider may try to reduce the hernia by pressing on it. Your symptoms and medical history. How is this treated? Surgery is the only treatment for an umbilical hernia. Surgery for a strangulated hernia is done as soon as possible. If you have a small hernia that is not incarcerated, you may need to lose weight before having surgery. Follow these instructions at home: Lose weight, if told by your health care provider. Do not try to push the hernia back in. Watch your hernia for any changes in color or size. Tell your health care provider if any changes occur. You may need to avoid activities that increase pressure on your hernia. Do not lift anything that is heavier than 10 lb (4.5 kg), or the limit that you are told, until your health care provider says that it is safe. Take over-the-counter and prescription medicines only as told by your health care provider. Keep all follow-up  visits. This is important. Contact a health care provider if: Your hernia gets larger. Your hernia becomes painful.  Get help right away if: You develop sudden, severe pain near the area of your hernia. You have pain as well as nausea or vomiting. You have pain and the skin over your hernia changes color. You develop a fever or chills. Summary A hernia is a bulge of tissue that pushes through an opening between muscles. An umbilical hernia happens near the belly button. Surgery is the only treatment for an umbilical hernia. Do not try to push your hernia back in. Keep all follow-up visits. This is important. This information is not intended to replace advice given to you by your health care provider. Make sure you discuss any questions you have with your health care provider. Document Revised: 04/11/2020 Document Reviewed: 04/11/2020 Elsevier Patient Education  [place], MD [facility]

## 2022-04-09 NOTE — Assessment & Plan Note (Signed)
Clinical stable.  No red flag signs or symptoms. Small umbilical/incisional hernia.  Not tender to palpation. We will confirm with ultrasound and refer to general surgeon as needed or as requested by patient.

## 2022-04-10 ENCOUNTER — Ambulatory Visit
Admission: RE | Admit: 2022-04-10 | Discharge: 2022-04-10 | Disposition: A | Discharge status: Home / Self Care | Payer: 59 | Source: Ambulatory Visit | Attending: Emergency Medicine | Admitting: Emergency Medicine

## 2022-04-10 ENCOUNTER — Encounter: Payer: Self-pay | Admitting: Emergency Medicine

## 2022-04-10 DIAGNOSIS — K429 Umbilical hernia without obstruction or gangrene: Secondary | ICD-10-CM

## 2022-04-11 ENCOUNTER — Other Ambulatory Visit: Payer: Self-pay | Admitting: Emergency Medicine

## 2022-04-11 DIAGNOSIS — K429 Umbilical hernia without obstruction or gangrene: Secondary | ICD-10-CM

## 2022-04-24 NOTE — Progress Notes (Unsigned)
Cardiology Office Note:    Date:  04/24/2022   ID:  Christopher [name], DOB 1961/07/17, MRN [id number]  PCP:  Horald Pollen, MD   [facility] Cardiologist:  Freada Bergeron, MD {  Referring MD: Horald Pollen, *    History of Present Illness:    Christopher [name] is a 61 y.o. male with a hx of prostate cancer and GERD who was previously followed by Dr. Meda Coffee who now returns to clinic for follow-up.  Initially saw Dr. Meda Coffee for palpitations, SOB and family history of CAD. Holter in 2017 showed rare PVCs and infrequent PACs. TTE 01/2016 with LVEF 60-65%, no WMA, no significant valve disease. Coronary CTA in 2019 with no significant CAD, Ca score 0.  Was seen in clinic on 01/2021 where he had gained 40lbs and was struggling with elevated blood pressure and cholesterol. He wanted to trial lifestyle modifications at that time. LDL came back 135 and was started on crestor.  Was last seen in clinic on 10/2021 where he doing okay from a CV standpoint. Was working on increasing his exercise.   Today, ***    Past Medical History:  Diagnosis Date   Cancer ([place])    Phreesia 03/26/2020   GERD (gastroesophageal reflux disease) 10/31/2011   Hearing impaired 12/17/2013   lifelong    Hypogonadism male 10/31/2011   Prostate cancer ([place])    PVC (premature ventricular contraction)     Past Surgical History:  Procedure Laterality Date   LYMPHADENECTOMY N/A 12/28/2019   Procedure: LYMPHADENECTOMY;  Surgeon: Raynelle Bring, MD;  Location: [facility];  Service: Urology;  Laterality: N/A;   PROSTATE BIOPSY     PROSTATE SURGERY N/A    Phreesia 03/26/2020   ROBOT ASSISTED LAPAROSCOPIC RADICAL PROSTATECTOMY N/A 12/28/2019   Procedure: XI ROBOTIC ASSISTED LAPAROSCOPIC RADICAL PROSTATECTOMY LEVEL 2;  Surgeon: Raynelle Bring, MD;  Location: [facility];  Service: Urology;  Laterality: N/A;   TONSILLECTOMY      Current Medications: No outpatient medications have been  marked as taking for the 04/30/22 encounter (Appointment) with Freada Bergeron, MD.     Allergies:   Asa arthritis strength-antacid [aspirin buffered], Penicillins, and Sulfa antibiotics   Social History   Socioeconomic History   Marital status: Married    Spouse name: Not on file   Number of children: 2   Years of education: Not on file   Highest education level: Not on file  Occupational History   Occupation: Dance movement psychotherapist    Employer: [employer]  Tobacco Use   Smoking status: Never   Smokeless tobacco: Never  Vaping Use   Vaping Use: Never used  Substance and Sexual Activity   Alcohol use: No   Drug use: No   Sexual activity: Yes  Other Topics Concern   Not on file  Social History Narrative   Married.  Education: Grade School.   Social Determinants of Health   Financial Resource Strain: Not on file  Food Insecurity: Not on file  Transportation Needs: Not on file  Physical Activity: Not on file  Stress: Not on file  Social Connections: Not on file     Family History: The patient's family history includes Heart disease in his mother; Hyperlipidemia in his father and mother; Prostate cancer in his father; Thyroid cancer in his sister. There is no history of Breast cancer, Pancreatic cancer, or [name] cancer.  ROS:   Please see the history of present illness.    Review  of Systems  Constitutional:  Negative for fever and weight loss.  HENT:  Negative for sinus pain and tinnitus.   Eyes:  Negative for pain and discharge.  Respiratory:  Negative for cough, shortness of breath, wheezing and stridor.   Cardiovascular:  Positive for palpitations. Negative for chest pain, orthopnea, claudication, leg swelling and PND.  Gastrointestinal:  Negative for blood in stool, nausea and vomiting.  Genitourinary:  Negative for frequency and urgency.  Musculoskeletal:  Negative for back pain and myalgias.  Neurological:  Negative for dizziness and loss of  consciousness.  Psychiatric/Behavioral:  Negative for depression.      EKGs/Labs/Other Studies Reviewed:    The following studies were reviewed today:  ETT: 02/2014 ETT Interpretation:  normal - no evidence of ischemia by ST analysis Comments: Patient presents today for routine GXT. Has strong FH for CAD - he is obese. Has had some dyspnea and palpitations.    Today the patient exercised on the standard Bruce protocol for a total of 9 minutes.  Good exercise tolerance.  Adequate blood pressure response.  Clinically negative for chest pain. Test was stopped due to achievement of target HR.  EKG negative for ischemia. No significant arrhythmia noted.    48-Holter monitor Rare PVCs, infrequent PACs. No arrhythmias or significant pauses identified. Normal Holter monitor, no therapy necessary.   Accessory Clinical Findings   Echocardiogram - 2009,Overall left ventricular systolic function was normal. Left ventricular ejection fraction was estimated to be 60 %. There were no left ventricular regional wall motion abnormalities. - Estimated peak pulmonary artery systolic pressure 24 mmHg.   TTE: 01/25/2016 - Left ventricle: The cavity size was normal. Wall thickness was   normal. Systolic function was normal. The estimated ejection   fraction was in the range of 60% to 65%. Wall motion was normal;   there were no regional wall motion abnormalities. Left   ventricular diastolic function parameters were normal. - Mitral valve: Mildly thickened leaflets . There was trivial   regurgitation. - Left atrium: The atrium was normal in size. - Right atrium: The atrium was mildly dilated. - Inferior vena cava: The vessel was normal in size. The   respirophasic diameter changes were in the normal range (>= 50%),   consistent with normal central venous pressure.   Impressions: - LVEF 60-65%, normal wall thickness, normal wall motion, normal   diastolic function, normal LA size, mildly  dilated RA, no   significant TR, normal IVC.      CCTA: 06/2018  1. Coronary calcium score of 0. This was 0 percentile for age and sex matched control.   2. Normal coronary origin with right dominance.   3. No evidence of CAD.   4.  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    EKG:   No new tracing today  Recent Labs: 02/22/2022: ALT 23; BUN 16; Creatinine, Ser 1.15; Potassium 4.4; Sodium 138; TSH 2.49  Recent Lipid Panel    Component Value Date/Time   CHOL 146 10/31/2021 0858   TRIG 96 10/31/2021 0858   HDL 46 10/31/2021 0858   CHOLHDL 3.2 10/31/2021 0858   CHOLHDL 5 02/21/2021 0925   VLDL 13.4 02/21/2021 0925   LDLCALC 82 10/31/2021 0858     Risk Assessment/Calculations:       Physical Exam:    VS:  There were no vitals taken for this visit.    Wt Readings from Last 3 Encounters:  04/09/22 251 lb 2 oz (113.9 kg)  02/22/22 264  lb (119.7 kg)  10/31/21 259 lb 9.6 oz (117.8 kg)     GEN: Well nourished, well developed in no acute distress HEENT: Normal NECK: No JVD; No carotid bruits CARDIAC: RRR, 1/6 systolic murmur, no rubs, no gallops RESPIRATORY:  Clear to auscultation without rales, wheezing or rhonchi  ABDOMEN: Soft, non-tender, non-distended MUSCULOSKELETAL:  No edema; No deformity  SKIN: Warm and dry NEUROLOGIC:  Alert and oriented x 3 PSYCHIATRIC:  Normal affect   ASSESSMENT:    No diagnosis found.  PLAN:    In order of problems listed above:  #Hypertension: Controlled with lifestyle modifications. Not requiring medications. -Continue lifestyle efforts -Goal<120/80s  #Noncardiac Chest Pain: Coronary CTA negative for obstructive disease with Ca score 0. TTE with normal LVEF, no valve disease. -Continue lifestyle modifications -Continue crestor '10mg'$  daily  #Palpitations: Cardiac monitor with rare PVCs and PACs, no significant arrthyhmias. Declined BB.  -Continue to monitor; if symptoms persist, can start BB  #HLD: Ca score 0  in 2019 with no significant disease on coronary CTA.  -Continue crestor '10mg'$  daily -Discussed lifestyle modifications at length  #Obesity with BMI 37: -Discussed diet and exercise at length as detailed below  Exercise recommendations: Goal of exercising for at least 30 minutes a day, at least 5 times per week.  Please exercise to a moderate exertion.  This means that while exercising it is difficult to speak in full sentences, however you are not so short of breath that you feel you must stop, and not so comfortable that you can carry on a full conversation.  Exertion level should be approximately a 5/10, if 10 is the most exertion you can perform.  Diet recommendations: Recommend a heart healthy diet such as the Mediterranean diet.  This diet consists of plant based foods, healthy fats, lean meats, olive oil.  It suggests limiting the intake of simple carbohydrates such as white breads, pastries, and pastas.  It also limits the amount of red meat, wine, and dairy products such as cheese that one should consume on a daily basis.   Medication Adjustments/Labs and Tests Ordered: Current medicines are reviewed at length with the patient today.  Concerns regarding medicines are outlined above.  No orders of the defined types were placed in this encounter.  No orders of the defined types were placed in this encounter.   There are no Patient Instructions on file for this visit.    Signed, Freada Bergeron, MD  04/24/2022 2:44 PM    [place]

## 2022-04-30 ENCOUNTER — Ambulatory Visit: Payer: 59 | Admitting: Cardiology

## 2022-04-30 ENCOUNTER — Encounter: Payer: Self-pay | Admitting: Cardiology

## 2022-04-30 VITALS — BP 116/62 | HR 60 | Ht 70.0 in | Wt 240.4 lb

## 2022-04-30 DIAGNOSIS — Z8249 Family history of ischemic heart disease and other diseases of the circulatory system: Secondary | ICD-10-CM | POA: Diagnosis not present

## 2022-04-30 DIAGNOSIS — R079 Chest pain, unspecified: Secondary | ICD-10-CM

## 2022-04-30 DIAGNOSIS — Z79899 Other long term (current) drug therapy: Secondary | ICD-10-CM

## 2022-04-30 DIAGNOSIS — E7800 Pure hypercholesterolemia, unspecified: Secondary | ICD-10-CM

## 2022-04-30 DIAGNOSIS — E785 Hyperlipidemia, unspecified: Secondary | ICD-10-CM

## 2022-04-30 DIAGNOSIS — R002 Palpitations: Secondary | ICD-10-CM

## 2022-04-30 DIAGNOSIS — I10 Essential (primary) hypertension: Secondary | ICD-10-CM | POA: Diagnosis not present

## 2022-04-30 LAB — LIPID PANEL
Chol/HDL Ratio: 2.9 ratio (ref 0.0–5.0)
Cholesterol, Total: 107 mg/dL (ref 100–199)
HDL: 37 mg/dL — ABNORMAL LOW (ref >39)
LDL Chol Calc (NIH): 55 mg/dL (ref 0–99)
Triglycerides: 69 mg/dL (ref 0–149)
VLDL Cholesterol Cal: 15 mg/dL (ref 5–40)

## 2022-04-30 NOTE — Patient Instructions (Signed)
Medication Instructions:   Your physician recommends that you continue on your current medications as directed. Please refer to the Current Medication list given to you today.  *If you need a refill on your cardiac medications before your next appointment, please call your pharmacy*   Lab Work:  TODAY--LIPIDS  If you have labs (blood work) drawn today and your tests are completely normal, you will receive your results only by: [place] (if you have MyChart) OR A paper copy in the mail If you have any lab test that is abnormal or we need to change your treatment, we will call you to review the results.   Follow-Up: At [facility], you and your health needs are our priority.  As part of our continuing mission to provide you with exceptional heart care, we have created designated Provider Care Teams.  These Care Teams include your primary Cardiologist (physician) and Advanced Practice Providers (APPs -  Physician Assistants and Nurse Practitioners) who all work together to provide you with the care you need, when you need it.  We recommend signing up for the patient portal called "MyChart".  Sign up information is provided on this After Visit Summary.  MyChart is used to connect with patients for Virtual Visits (Telemedicine).  Patients are able to view lab/test results, encounter notes, upcoming appointments, etc.  Non-urgent messages can be sent to your provider as well.   To learn more about what you can do with MyChart, go to [email or web address].    Your next appointment:   6 month(s)  The format for your next appointment:   In Person  Provider:   Freada Bergeron, MD {   Important Information About Sugar

## 2022-05-07 ENCOUNTER — Ambulatory Visit: Payer: Self-pay | Admitting: Surgery

## 2022-06-08 ENCOUNTER — Telehealth: Payer: 59 | Admitting: Family Medicine

## 2022-06-08 DIAGNOSIS — U071 COVID-19: Secondary | ICD-10-CM

## 2022-06-08 MED ORDER — NIRMATRELVIR/RITONAVIR (PAXLOVID)TABLET: 3.0000 | ORAL_TABLET | Freq: Two times a day (BID) | ORAL | 0 refills | Status: AC

## 2022-06-08 NOTE — Patient Instructions (Signed)

## 2022-06-08 NOTE — Progress Notes (Signed)
Virtual Visit Consent   KHAMARION BJELLAND [name], you are scheduled for a virtual visit with a [facility] provider today. Just as with appointments in the office, your consent must be obtained to participate. Your consent will be active for this visit and any virtual visit you may have with one of our providers in the next 365 days. If you have a MyChart account, a copy of this consent can be sent to you electronically.  As this is a virtual visit, video technology does not allow for your provider to perform a traditional examination. This may limit your provider's ability to fully assess your condition. If your provider identifies any concerns that need to be evaluated in person or the need to arrange testing (such as labs, EKG, etc.), we will make arrangements to do so. Although advances in technology are sophisticated, we cannot ensure that it will always work on either your end or our end. If the connection with a video visit is poor, the visit may have to be switched to a telephone visit. With either a video or telephone visit, we are not always able to ensure that we have a secure connection.  By engaging in this virtual visit, you consent to the provision of healthcare and authorize for your insurance to be billed (if applicable) for the services provided during this visit. Depending on your insurance coverage, you may receive a charge related to this service.  I need to obtain your verbal consent now. Are you willing to proceed with your visit today? JAXXON NAEEM [name] has provided verbal consent on 06/08/2022 for a virtual visit (video or telephone). Dellia Nims, FNP  Date: 06/08/2022 11:08 AM  Virtual Visit via Video Note   I, Dellia Nims, connected with  Christopher [name]  ([id number], 11/17/1960) on 06/08/22 at 10:45 AM EDT by a video-enabled telemedicine application and verified that I am speaking with the correct person using two identifiers.  Location: Patient: Virtual Visit  Location Patient: Home Provider: Virtual Visit Location Provider: Home Office   I discussed the limitations of evaluation and management by telemedicine and the availability of in person appointments. The patient expressed understanding and agreed to proceed.    History of Present Illness: KRAIG GENIS [name] is a 61 y.o. who identifies as a male who was assigned male at birth, and is being seen today for positive covid testing in [place] 2 days ago. Came home yesterday. Paxlovid was not available there but he is requesting it here. He has a sore throat, body aches, chills. Denies cough, wheezing and sob. He has been taking tylenol. He also planned to get afrin but is advised to get flonase instead. Marland Kitchen  HPI: HPI  Problems:  Patient Active Problem List   Diagnosis Date Noted   Umbilical hernia without obstruction and without gangrene 04/09/2022   Family history of thyroid cancer 02/22/2022   History of prostate cancer 02/22/2022   Transient hypertension 02/21/2021   Prostate cancer ([place]) 12/28/2019   Malignant neoplasm of prostate ([place]) 11/17/2019   Family history of premature CAD 01/13/2016   Dyslipidemia 01/13/2016   Palpitations 01/13/2016   Low testosterone in male 05/07/2014   Hearing impaired 12/17/2013    Allergies:  Allergies  Allergen Reactions   Asa Arthritis Strength-Antacid [Aspirin Buffered] Swelling   Penicillins Hives and Other (See Comments)    Did it involve swelling of the face/tongue/throat, SOB, or low BP? No Did it involve sudden or severe rash/hives, skin peeling, or  any reaction on the inside of your mouth or nose? No Did you need to seek medical attention at a hospital or doctor's office? No When did it last happen?   childhood    If all above answers are "NO", may proceed with cephalosporin use.  Did it involve swelling of the face/tongue/throat, SOB, or low BP? No Did it involve sudden or severe rash/hives, skin peeling, or any reaction on the inside of  your mouth or nose? No Did you need to seek medical attention at a hospital or doctor's office? No When did it last happen?   childhood    If all above answers are "NO", may proceed with cephalosporin use.   Sulfa Antibiotics Hives   Medications:  Current Outpatient Medications:    nirmatrelvir/ritonavir EUA (PAXLOVID) 20 x 150 MG & 10 x '100MG'$  TABS, Take 3 tablets by mouth 2 (two) times daily for 5 days. (Take nirmatrelvir 150 mg two tablets twice daily for 5 days and ritonavir 100 mg one tablet twice daily for 5 days, Disp: 30 tablet, Rfl: 0   rosuvastatin (CRESTOR) 10 MG tablet, Take 1 tablet (10 mg total) by mouth daily., Disp: 90 tablet, Rfl: 3  Observations/Objective: Patient is well-developed, well-nourished in no acute distress.  Resting comfortably  at home.  Head is normocephalic, atraumatic.  No labored breathing.  Speech is clear and coherent with logical content.  Patient is alert and oriented at baseline.    Assessment and Plan: 1. COVID  Increase fluids, med use and side effects, tylenol, mvi with vit d and zinc. Urgent care if sx worsen.   Follow Up Instructions: I discussed the assessment and treatment plan with the patient. The patient was provided an opportunity to ask questions and all were answered. The patient agreed with the plan and demonstrated an understanding of the instructions.  A copy of instructions were sent to the patient via MyChart unless otherwise noted below.     The patient was advised to call back or seek an in-person evaluation if the symptoms worsen or if the condition fails to improve as anticipated.  Time:  I spent 10 minutes with the patient via telehealth technology discussing the above problems/concerns.    Dellia Nims, FNP Creatinine 1.15

## 2022-08-27 ENCOUNTER — Ambulatory Visit: Payer: 59 | Admitting: Emergency Medicine

## 2022-10-16 ENCOUNTER — Ambulatory Visit: Payer: 59 | Admitting: Emergency Medicine

## 2022-10-16 ENCOUNTER — Encounter: Payer: Self-pay | Admitting: Emergency Medicine

## 2022-10-16 ENCOUNTER — Ambulatory Visit (INDEPENDENT_AMBULATORY_CARE_PROVIDER_SITE_OTHER): Payer: 59

## 2022-10-16 VITALS — BP 130/72 | HR 58 | Temp 98.0°F | Ht 70.0 in | Wt 230.0 lb

## 2022-10-16 DIAGNOSIS — Z8546 Personal history of malignant neoplasm of prostate: Secondary | ICD-10-CM | POA: Diagnosis not present

## 2022-10-16 DIAGNOSIS — E785 Hyperlipidemia, unspecified: Secondary | ICD-10-CM

## 2022-10-16 DIAGNOSIS — R7989 Other specified abnormal findings of blood chemistry: Secondary | ICD-10-CM

## 2022-10-16 DIAGNOSIS — J22 Unspecified acute lower respiratory infection: Secondary | ICD-10-CM | POA: Insufficient documentation

## 2022-10-16 DIAGNOSIS — R051 Acute cough: Secondary | ICD-10-CM | POA: Diagnosis not present

## 2022-10-16 DIAGNOSIS — L989 Disorder of the skin and subcutaneous tissue, unspecified: Secondary | ICD-10-CM | POA: Insufficient documentation

## 2022-10-16 LAB — CBC WITH DIFFERENTIAL/PLATELET
Basophils Absolute: 0 10*3/uL (ref 0.0–0.1)
Basophils Relative: 0.7 % (ref 0.0–3.0)
Eosinophils Absolute: 0.1 10*3/uL (ref 0.0–0.7)
Eosinophils Relative: 2.6 % (ref 0.0–5.0)
HCT: 40.9 % (ref 39.0–52.0)
Hemoglobin: 14 g/dL (ref 13.0–17.0)
Lymphocytes Relative: 27.9 % (ref 12.0–46.0)
Lymphs Abs: 1.6 10*3/uL (ref 0.7–4.0)
MCHC: 34.2 g/dL (ref 30.0–36.0)
MCV: 85.5 fl (ref 78.0–100.0)
Monocytes Absolute: 0.6 10*3/uL (ref 0.1–1.0)
Monocytes Relative: 10.5 % (ref 3.0–12.0)
Neutro Abs: 3.4 10*3/uL (ref 1.4–7.7)
Neutrophils Relative %: 58.3 % (ref 43.0–77.0)
Platelets: 238 10*3/uL (ref 150.0–400.0)
RBC: 4.78 Mil/uL (ref 4.22–5.81)
RDW: 13 % (ref 11.5–15.5)
WBC: 5.8 10*3/uL (ref 4.0–10.5)

## 2022-10-16 LAB — LIPID PANEL
Cholesterol: 141 mg/dL (ref 0–200)
HDL: 43.9 mg/dL (ref >39.00)
LDL Cholesterol: 79 mg/dL (ref 0–99)
NonHDL: 96.86
Total CHOL/HDL Ratio: 3
Triglycerides: 89 mg/dL (ref 0.0–149.0)
VLDL: 17.8 mg/dL (ref 0.0–40.0)

## 2022-10-16 LAB — COMPREHENSIVE METABOLIC PANEL
ALT: 24 U/L (ref 0–53)
AST: 21 U/L (ref 0–37)
Albumin: 4.4 g/dL (ref 3.5–5.2)
Alkaline Phosphatase: 83 U/L (ref 39–117)
BUN: 18 mg/dL (ref 6–23)
CO2: 28 mEq/L (ref 19–32)
Calcium: 9.5 mg/dL (ref 8.4–10.5)
Chloride: 102 mEq/L (ref 96–112)
Creatinine, Ser: 1.05 mg/dL (ref 0.40–1.50)
GFR: 76.76 mL/min (ref >60.00)
Glucose, Bld: 96 mg/dL (ref 70–99)
Potassium: 4.2 mEq/L (ref 3.5–5.1)
Sodium: 138 mEq/L (ref 135–145)
Total Bilirubin: 0.9 mg/dL (ref 0.2–1.2)
Total Protein: 7.6 g/dL (ref 6.0–8.3)

## 2022-10-16 LAB — HEMOGLOBIN A1C: Hgb A1c MFr Bld: 5.7 % (ref 4.6–6.5)

## 2022-10-16 LAB — PSA: PSA: 0.06 ng/mL — ABNORMAL LOW (ref 0.10–4.00)

## 2022-10-16 MED ORDER — AZITHROMYCIN 250 MG PO TABS: ORAL_TABLET | ORAL | 0 refills | Status: DC

## 2022-10-16 MED ORDER — HYDROCODONE BIT-HOMATROP MBR 5-1.5 MG/5ML PO SOLN: 5.0000 mL | Freq: Every evening | ORAL | 0 refills | Status: DC | PRN

## 2022-10-16 NOTE — Assessment & Plan Note (Signed)
Recommend over-the-counter Mucinex DM and cough drops Hycodan syrup at bedtime Recommend to rest and stay well-hydrated.

## 2022-10-16 NOTE — Progress Notes (Signed)
Christopher [name] Colon 62 y.o.   Chief Complaint  Patient presents with   Follow-up    48mth f/u appt, cough x 10 days, sleep issues     HISTORY OF PRESENT ILLNESS: This is a 62y.o. male here for 674-monthollow-up Healthy male with a healthy lifestyle.  Eats well and exercises regularly History of prostate cancer with prostatectomy.  Doing well. Productive cough for 10 days Recently retired.  Sleep cycle affected due to schedule changes. Also complaining of right-sided facial lesion.  Needs dermatology referral. No other complaint or medical concerns today.  HPI   Prior to Admission medications   Medication Sig Start Date End Date Taking? Authorizing Provider  rosuvastatin (CRESTOR) 10 MG tablet Take 1 tablet (10 mg total) by mouth daily. 10/31/21   PeFreada BergeronMD    Allergies  Allergen Reactions   AsDiona Fantirthritis Strength-Antacid [Aspirin Buffered] Swelling   Penicillins Hives and Other (See Comments)    Did it involve swelling of the face/tongue/throat, SOB, or low BP? No Did it involve sudden or severe rash/hives, skin peeling, or any reaction on the inside of your mouth or nose? No Did you need to seek medical attention at a hospital or doctor's office? No When did it last happen?   childhood    If all above answers are "NO", may proceed with cephalosporin use.  Did it involve swelling of the face/tongue/throat, SOB, or low BP? No Did it involve sudden or severe rash/hives, skin peeling, or any reaction on the inside of your mouth or nose? No Did you need to seek medical attention at a hospital or doctor's office? No When did it last happen?   childhood    If all above answers are "NO", may proceed with cephalosporin use.   Sulfa Antibiotics Hives    Patient Active Problem List   Diagnosis Date Noted   Family history of thyroid cancer 02/22/2022   History of prostate cancer 02/22/2022   Transient hypertension 02/21/2021   [place]    [place]   Family history of premature CAD 01/13/2016   Dyslipidemia 01/13/2016   Low testosterone in male 05/07/2014   Hearing impaired 12/17/2013    Past Medical History:  Diagnosis Date   [place]   [facility] 03/26/2020   GERD (gastroesophageal reflux disease) 10/31/2011   Hearing impaired 12/17/2013   lifelong    Hypogonadism male 10/31/2011   [place]   PVC (premature ventricular contraction)     Past Surgical History:  Procedure Laterality Date   LYMPHADENECTOMY N/A 12/28/2019   Procedure: LYMPHADENECTOMY;  Surgeon: BoRaynelle BringMD;  Location: [facility];  Service: Urology;  Laterality: N/A;   PROSTATE BIOPSY     PROSTATE SURGERY N/A    [facility] 03/26/2020   ROBOT ASSISTED LAPAROSCOPIC RADICAL PROSTATECTOMY N/A 12/28/2019   Procedure: XI ROBOTIC ASSISTED LAPAROSCOPIC RADICAL PROSTATECTOMY LEVEL 2;  Surgeon: BoRaynelle BringMD;  Location: [facility];  Service: Urology;  Laterality: N/A;   TONSILLECTOMY      Social History   Socioeconomic History   Marital status: Married    Spouse name: Not on file   Number of children: 2   Years of education: Not on file   Highest education level: Not on file  Occupational History   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  Education: Grade School.   Social Determinants of Health   Financial Resource Strain: Not on file  Food Insecurity: Not on file  Transportation Needs: Not on file  Physical Activity: Not on file  Stress: Not on file  Social Connections: Not on file  Intimate Partner Violence: Not on file    Family History  Problem Relation Age of Onset   Hyperlipidemia Father     Prostate cancer Father        treated with ADT and XRT   Heart disease Mother    Hyperlipidemia Mother    Thyroid cancer Sister    Breast cancer Neg Hx    Pancreatic cancer Neg Hx    Colon cancer Neg Hx      Review of Systems  Constitutional: Negative.  Negative for chills and fever.  HENT: Negative.  Negative for congestion and sore throat.   Respiratory:  Positive for cough.   Cardiovascular: Negative.  Negative for chest pain and palpitations.  Gastrointestinal: Negative.  Negative for abdominal pain, diarrhea, nausea and vomiting.  Genitourinary: Negative.  Negative for dysuria and hematuria.  Skin: Negative.  Negative for rash.  Neurological: Negative.  Negative for dizziness and headaches.  Psychiatric/Behavioral:  The patient has insomnia.   All other systems reviewed and are negative.   Today's Vitals   10/16/22 0835  Weight: 230 lb (104.3 kg)  Height: '5\' 10"'$  (1.778 m)   Body mass index is 33 kg/m. Today's Vitals   10/16/22 0835  BP: 130/72  Pulse: (!) 58  Temp: 98 F (36.7 C)  TempSrc: Oral  SpO2: 98%  Weight: 230 lb (104.3 kg)  Height: '5\' 10"'$  (1.778 m)   Body mass index is 33 kg/m.   Physical Exam Vitals reviewed.  Constitutional:      Appearance: Normal appearance.  HENT:     Head: Normocephalic.     Mouth/Throat:     Mouth: Mucous membranes are moist.     Pharynx: Oropharynx is clear.  Eyes:     Extraocular Movements: Extraocular movements intact.     Conjunctiva/sclera: Conjunctivae normal.     Pupils: Pupils are equal, round, and reactive to light.  Cardiovascular:     Rate and Rhythm: Normal rate and regular rhythm.     Pulses: Normal pulses.     Heart sounds: Normal heart sounds.  Pulmonary:     Effort: Pulmonary effort is normal.     Breath sounds: Normal breath sounds.  Abdominal:     Palpations: Abdomen is soft.     Tenderness: There is no abdominal tenderness.  Musculoskeletal:     Cervical back: No tenderness.   Lymphadenopathy:     Cervical: No cervical adenopathy.  Skin:    General: Skin is warm and dry.     Findings: Lesion present.     Comments: Preauricular elevated skin colored lesion right side  Neurological:     General: No focal deficit present.     Mental Status: He is alert and oriented to person, place, and time.  Psychiatric:        Mood and Affect: Mood normal.        Behavior: Behavior normal.    DG Chest 2 View  Result Date: 10/16/2022 CLINICAL DATA:  Cough  and itchy throat for 10 days. EXAM: CHEST - 2 VIEW COMPARISON:  09/27/2016 FINDINGS: Heart size and mediastinal contours are unremarkable. No pleural effusion or edema. No airspace opacities identified. Degenerative changes noted within the thoracic spine. IMPRESSION: No active cardiopulmonary disease. Electronically Signed   By: Kerby Moors M.D.   On: 10/16/2022 09:24     ASSESSMENT & PLAN: A total of 43 minutes was spent with the patient and counseling/coordination of care regarding preparing for this visit, review of most recent office visit notes, review of most recent blood work results, diagnosis of dyslipidemia and cardiovascular risk associated with this condition, diagnosis of lower respiratory infection and need for antibiotics, review of chest x-ray report done today, cough management, education on nutrition, prognosis, documentation, and need for follow-up.  Problem List Items Addressed This Visit       Respiratory   Lower respiratory infection    Persistent productive cough.  Affecting quality of life. Chest x-ray done today. Viral respiratory infection with secondary bacterial infection.  Will benefit from daily azithromycin for 5 days.      Relevant Medications   azithromycin (ZITHROMAX) 250 MG tablet   Other Relevant Orders   CBC with Differential/Platelet     Other   Low testosterone in male    Stable. Will check testosterone levels today.      Relevant Orders   TestT+TestF+SHBG    Dyslipidemia - Primary    Stable.  Diet and nutrition discussed. Advised to decrease amount of daily carbohydrate intake and daily calories and increase amount of plant based protein in his diet. Repeat profile done today.       Relevant Orders   Comprehensive metabolic panel   Lipid panel   Hemoglobin A1c   History of prostate cancer    Stable.  Asymptomatic. PSA levels done today.      Relevant Orders   PSA   Acute cough    Recommend over-the-counter Mucinex DM and cough drops Hycodan syrup at bedtime Recommend to rest and stay well-hydrated.      Relevant Medications   HYDROcodone bit-homatropine (HYCODAN) 5-1.5 MG/5ML syrup   Other Relevant Orders   DG Chest 2 View   CBC with Differential/Platelet   Facial lesion   Relevant Orders   Ambulatory referral to Dermatology     Patient Instructions  Health Maintenance, Male Adopting a healthy lifestyle and getting preventive care are important in promoting health and wellness. Ask your health care provider about: The right schedule for you to have regular tests and exams. Things you can do on your own to prevent diseases and keep yourself healthy. What should I know about diet, weight, and exercise? Eat a healthy diet  Eat a diet that includes plenty of vegetables, fruits, low-fat dairy products, and lean protein. Do not eat a lot of foods that are high in solid fats, added sugars, or sodium. Maintain a healthy weight Body mass index (BMI) is a measurement that can be used to identify possible weight problems. It estimates body fat based on height and weight. Your health care provider can help determine your BMI and help you achieve or maintain a healthy weight. Get regular exercise Get regular exercise. This is one of the most important things you can do for your health. Most adults should: Exercise for at least 150 minutes each week. The exercise should increase your heart rate and make you sweat (moderate-intensity  exercise). Do strengthening exercises at least twice a week. This is in addition  to the moderate-intensity exercise. Spend less time sitting. Even light physical activity can be beneficial. Watch cholesterol and blood lipids Have your blood tested for lipids and cholesterol at 62 years of age, then have this test every 5 years. You may need to have your cholesterol levels checked more often if: Your lipid or cholesterol levels are high. You are older than 62 years of age. You are at high risk for heart disease. What should I know about cancer screening? Many types of cancers can be detected early and may often be prevented. Depending on your health history and family history, you may need to have cancer screening at various ages. This may include screening for: Colorectal cancer. Prostate cancer. Skin cancer. Lung cancer. What should I know about heart disease, diabetes, and high blood pressure? Blood pressure and heart disease High blood pressure causes heart disease and increases the risk of stroke. This is more likely to develop in people who have high blood pressure readings or are overweight. Talk with your health care provider about your target blood pressure readings. Have your blood pressure checked: Every 3-5 years if you are 7-65 years of age. Every year if you are 107 years old or older. If you are between the ages of 26 and 9 and are a current or former smoker, ask your health care provider if you should have a one-time screening for abdominal aortic aneurysm (AAA). Diabetes Have regular diabetes screenings. This checks your fasting blood sugar level. Have the screening done: Once every three years after age 27 if you are at a normal weight and have a low risk for diabetes. More often and at a younger age if you are overweight or have a high risk for diabetes. What should I know about preventing infection? Hepatitis B If you have a higher risk for hepatitis B, you should be  screened for this virus. Talk with your health care provider to find out if you are at risk for hepatitis B infection. Hepatitis C Blood testing is recommended for: Everyone born from [birth year] through 1965. Anyone with known risk factors for hepatitis C. Sexually transmitted infections (STIs) You should be screened each year for STIs, including gonorrhea and chlamydia, if: You are sexually active and are younger than 62 years of age. You are older than 62 years of age and your health care provider tells you that you are at risk for this type of infection. Your sexual activity has changed since you were last screened, and you are at increased risk for chlamydia or gonorrhea. Ask your health care provider if you are at risk. Ask your health care provider about whether you are at high risk for HIV. Your health care provider may recommend a prescription medicine to help prevent HIV infection. If you choose to take medicine to prevent HIV, you should first get tested for HIV. You should then be tested every 3 months for as long as you are taking the medicine. Follow these instructions at home: Alcohol use Do not drink alcohol if your health care provider tells you not to drink. If you drink alcohol: Limit how much you have to 0-2 drinks a day. Know how much alcohol is in your drink. In the [place] one drink equals one 12 oz bottle of beer (355 mL), one 5 oz glass of wine (148 mL), or one 1 oz glass of hard liquor (44 mL). Lifestyle Do not use any products that contain nicotine or tobacco. These products include cigarettes, chewing tobacco,  and vaping devices, such as e-cigarettes. If you need help quitting, ask your health care provider. Do not use street drugs. Do not share needles. Ask your health care provider for help if you need support or information about quitting drugs. General instructions Schedule regular health, dental, and eye exams. Stay current with your vaccines. Tell your health care  provider if: You often feel depressed. You have ever been abused or do not feel safe at home. Summary Adopting a healthy lifestyle and getting preventive care are important in promoting health and wellness. Follow your health care provider's instructions about healthy diet, exercising, and getting tested or screened for diseases. Follow your health care provider's instructions on monitoring your cholesterol and blood pressure. This information is not intended to replace advice given to you by your health care provider. Make sure you discuss any questions you have with your health care provider. Document Revised: 01/23/2021 Document Reviewed: 01/23/2021 Elsevier Patient Education  [place], MD [facility]

## 2022-10-16 NOTE — Assessment & Plan Note (Signed)
Persistent productive cough.  Affecting quality of life. Chest x-ray done today. Viral respiratory infection with secondary bacterial infection.  Will benefit from daily azithromycin for 5 days.

## 2022-10-16 NOTE — Assessment & Plan Note (Signed)
Stable. Will check testosterone levels today.

## 2022-10-16 NOTE — Patient Instructions (Signed)
Health Maintenance, Male Adopting a healthy lifestyle and getting preventive care are important in promoting health and wellness. Ask your health care provider about: The right schedule for you to have regular tests and exams. Things you can do on your own to prevent diseases and keep yourself healthy. What should I know about diet, weight, and exercise? Eat a healthy diet  Eat a diet that includes plenty of vegetables, fruits, low-fat dairy products, and lean protein. Do not eat a lot of foods that are high in solid fats, added sugars, or sodium. Maintain a healthy weight Body mass index (BMI) is a measurement that can be used to identify possible weight problems. It estimates body fat based on height and weight. Your health care provider can help determine your BMI and help you achieve or maintain a healthy weight. Get regular exercise Get regular exercise. This is one of the most important things you can do for your health. Most adults should: Exercise for at least 150 minutes each week. The exercise should increase your heart rate and make you sweat (moderate-intensity exercise). Do strengthening exercises at least twice a week. This is in addition to the moderate-intensity exercise. Spend less time sitting. Even light physical activity can be beneficial. Watch cholesterol and blood lipids Have your blood tested for lipids and cholesterol at 62 years of age, then have this test every 5 years. You may need to have your cholesterol levels checked more often if: Your lipid or cholesterol levels are high. You are older than 62 years of age. You are at high risk for heart disease. What should I know about cancer screening? Many types of cancers can be detected early and may often be prevented. Depending on your health history and family history, you may need to have cancer screening at various ages. This may include screening for: Colorectal cancer. Prostate cancer. Skin cancer. Lung  cancer. What should I know about heart disease, diabetes, and high blood pressure? Blood pressure and heart disease High blood pressure causes heart disease and increases the risk of stroke. This is more likely to develop in people who have high blood pressure readings or are overweight. Talk with your health care provider about your target blood pressure readings. Have your blood pressure checked: Every 3-5 years if you are 18-39 years of age. Every year if you are 40 years old or older. If you are between the ages of 65 and 75 and are a current or former smoker, ask your health care provider if you should have a one-time screening for abdominal aortic aneurysm (AAA). Diabetes Have regular diabetes screenings. This checks your fasting blood sugar level. Have the screening done: Once every three years after age 45 if you are at a normal weight and have a low risk for diabetes. More often and at a younger age if you are overweight or have a high risk for diabetes. What should I know about preventing infection? Hepatitis B If you have a higher risk for hepatitis B, you should be screened for this virus. Talk with your health care provider to find out if you are at risk for hepatitis B infection. Hepatitis C Blood testing is recommended for: Everyone born from 1945 through 1965. Anyone with known risk factors for hepatitis C. Sexually transmitted infections (STIs) You should be screened each year for STIs, including gonorrhea and chlamydia, if: You are sexually active and are younger than 62 years of age. You are older than 62 years of age and your   health care provider tells you that you are at risk for this type of infection. Your sexual activity has changed since you were last screened, and you are at increased risk for chlamydia or gonorrhea. Ask your health care provider if you are at risk. Ask your health care provider about whether you are at high risk for HIV. Your health care provider  may recommend a prescription medicine to help prevent HIV infection. If you choose to take medicine to prevent HIV, you should first get tested for HIV. You should then be tested every 3 months for as long as you are taking the medicine. Follow these instructions at home: Alcohol use Do not drink alcohol if your health care provider tells you not to drink. If you drink alcohol: Limit how much you have to 0-2 drinks a day. Know how much alcohol is in your drink. In the [place] one drink equals one 12 oz bottle of beer (355 mL), one 5 oz glass of wine (148 mL), or one 1 oz glass of hard liquor (44 mL). Lifestyle Do not use any products that contain nicotine or tobacco. These products include cigarettes, chewing tobacco, and vaping devices, such as e-cigarettes. If you need help quitting, ask your health care provider. Do not use street drugs. Do not share needles. Ask your health care provider for help if you need support or information about quitting drugs. General instructions Schedule regular health, dental, and eye exams. Stay current with your vaccines. Tell your health care provider if: You often feel depressed. You have ever been abused or do not feel safe at home. Summary Adopting a healthy lifestyle and getting preventive care are important in promoting health and wellness. Follow your health care provider's instructions about healthy diet, exercising, and getting tested or screened for diseases. Follow your health care provider's instructions on monitoring your cholesterol and blood pressure. This information is not intended to replace advice given to you by your health care provider. Make sure you discuss any questions you have with your health care provider. Document Revised: 01/23/2021 Document Reviewed: 01/23/2021 Elsevier Patient Education  2023 Elsevier Inc.  

## 2022-10-16 NOTE — Assessment & Plan Note (Signed)
Stable.  Asymptomatic. PSA levels done today.

## 2022-10-16 NOTE — Assessment & Plan Note (Signed)
Stable.  Diet and nutrition discussed. Advised to decrease amount of daily carbohydrate intake and daily calories and increase amount of plant based protein in his diet. Repeat profile done today.

## 2022-10-20 NOTE — Progress Notes (Unsigned)
Cardiology Office Note:    Date:  10/22/2022   ID:  Christopher [name], DOB August 28, 1961, MRN [id number]  PCP:  Horald Pollen, MD   [facility] Cardiologist:  Freada Bergeron, MD {  Referring MD: Horald Pollen, *    History of Present Illness:    Christopher [name] is a 62 y.o. male with a hx of prostate cancer and GERD who was previously followed by Dr. Meda Coffee who now returns to clinic for follow-up.  Initially saw Dr. Meda Coffee for palpitations, SOB and family history of CAD. Holter in 2017 showed rare PVCs and infrequent PACs. TTE 01/2016 with LVEF 60-65%, no WMA, no significant valve disease. Coronary CTA in 2019 with no significant CAD, Ca score 0.  Was seen in clinic on 01/2021 where he had gained 40lbs and was struggling with elevated blood pressure and cholesterol. He wanted to trial lifestyle modifications at that time. LDL came back 135 and was started on crestor.  Was last seen in clinic on 04/2023 where he was doing well from a CV perspective. Lost 24lbs.  Today, the patient overall feels well. Remains very active with no exertional symptoms. Occasional PVCs but not overly bothersome. No LE edema, orthopnea, lightheadedness or dizziness. Has lost another 11lbs since 04/2022 through diet and exercise. Compliant with medications. He is interested in repeat a Ca score.    Past Medical History:  Diagnosis Date   Cancer ([place])    Phreesia 03/26/2020   GERD (gastroesophageal reflux disease) 10/31/2011   Hearing impaired 12/17/2013   lifelong    Hypogonadism male 10/31/2011   Prostate cancer ([place])    PVC (premature ventricular contraction)     Past Surgical History:  Procedure Laterality Date   LYMPHADENECTOMY N/A 12/28/2019   Procedure: LYMPHADENECTOMY;  Surgeon: Raynelle Bring, MD;  Location: [facility];  Service: Urology;  Laterality: N/A;   PROSTATE BIOPSY     PROSTATE SURGERY N/A    Phreesia 03/26/2020   ROBOT ASSISTED LAPAROSCOPIC  RADICAL PROSTATECTOMY N/A 12/28/2019   Procedure: XI ROBOTIC ASSISTED LAPAROSCOPIC RADICAL PROSTATECTOMY LEVEL 2;  Surgeon: Raynelle Bring, MD;  Location: [facility];  Service: Urology;  Laterality: N/A;   TONSILLECTOMY      Current Medications: Current Meds  Medication Sig   rosuvastatin (CRESTOR) 10 MG tablet Take 1 tablet (10 mg total) by mouth daily.     Allergies:   Asa arthritis strength-antacid [aspirin buffered], Penicillins, and Sulfa antibiotics   Social History   Socioeconomic History   Marital status: Married    Spouse name: Not on file   Number of children: 2   Years of education: Not on file   Highest education level: Not on file  Occupational History   Occupation: Chemical engineer: [employer]  Tobacco Use   Smoking status: Never   Smokeless tobacco: Never  Vaping Use   Vaping Use: Never used  Substance and Sexual Activity   Alcohol use: No   Drug use: No   Sexual activity: Yes  Other Topics Concern   Not on file  Social History Narrative   Married.  Education: Grade School.   Social Determinants of Health   Financial Resource Strain: Not on file  Food Insecurity: Not on file  Transportation Needs: Not on file  Physical Activity: Not on file  Stress: Not on file  Social Connections: Not on file     Family History: The patient's family history includes Heart disease in his  mother; Hyperlipidemia in his father and mother; Prostate cancer in his father; Thyroid cancer in his sister. There is no history of Breast cancer, Pancreatic cancer, or [name] cancer.  ROS:   Please see the history of present illness.    Review of Systems  Constitutional:  Negative for fever and weight loss.  HENT:  Negative for sinus pain and tinnitus.   Eyes:  Negative for pain and discharge.  Respiratory:  Negative for cough, shortness of breath, wheezing and stridor.   Cardiovascular:  Positive for palpitations. Negative for chest pain, orthopnea,  claudication, leg swelling and PND.  Gastrointestinal:  Negative for blood in stool and vomiting.  Genitourinary:  Negative for frequency and urgency.  Neurological:  Negative for dizziness and loss of consciousness.  Psychiatric/Behavioral:  Negative for depression.      EKGs/Labs/Other Studies Reviewed:    The following studies were reviewed today:  ETT: 02/2014 ETT Interpretation:  normal - no evidence of ischemia by ST analysis Comments: Patient presents today for routine GXT. Has strong FH for CAD - he is obese. Has had some dyspnea and palpitations.    Today the patient exercised on the standard Bruce protocol for a total of 9 minutes.  Good exercise tolerance.  Adequate blood pressure response.  Clinically negative for chest pain. Test was stopped due to achievement of target HR.  EKG negative for ischemia. No significant arrhythmia noted.    48-Holter monitor Rare PVCs, infrequent PACs. No arrhythmias or significant pauses identified. Normal Holter monitor, no therapy necessary.   Accessory Clinical Findings   Echocardiogram - 2009,Overall left ventricular systolic function was normal. Left ventricular ejection fraction was estimated to be 60 %. There were no left ventricular regional wall motion abnormalities. - Estimated peak pulmonary artery systolic pressure 24 mmHg.   TTE: 01/25/2016 - Left ventricle: The cavity size was normal. Wall thickness was   normal. Systolic function was normal. The estimated ejection   fraction was in the range of 60% to 65%. Wall motion was normal;   there were no regional wall motion abnormalities. Left   ventricular diastolic function parameters were normal. - Mitral valve: Mildly thickened leaflets . There was trivial   regurgitation. - Left atrium: The atrium was normal in size. - Right atrium: The atrium was mildly dilated. - Inferior vena cava: The vessel was normal in size. The   respirophasic diameter changes were in the  normal range (>= 50%),   consistent with normal central venous pressure.   Impressions: - LVEF 60-65%, normal wall thickness, normal wall motion, normal   diastolic function, normal LA size, mildly dilated RA, no   significant TR, normal IVC.      CCTA: 06/2018  1. Coronary calcium score of 0. This was 0 percentile for age and sex matched control.   2. Normal coronary origin with right dominance.   3. No evidence of CAD.   4.  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    EKG:   04/30/22: ECG personally reviewed and shows sinus bradycardia, HR 58  Recent Labs: 02/22/2022: TSH 2.49 10/16/2022: ALT 24; BUN 18; Creatinine, Ser 1.05; Hemoglobin 14.0; Platelets 238.0; Potassium 4.2; Sodium 138  Recent Lipid Panel    Component Value Date/Time   CHOL 141 10/16/2022 0925   CHOL 107 04/30/2022 0825   TRIG 89.0 10/16/2022 0925   HDL 43.90 10/16/2022 0925   HDL 37 (L) 04/30/2022 0825   CHOLHDL 3 10/16/2022 0925   VLDL 17.8 10/16/2022 0925  LDLCALC 79 10/16/2022 0925   LDLCALC 55 04/30/2022 0825     Risk Assessment/Calculations:       Physical Exam:    VS:  BP 120/72   Pulse 64   Ht '5\' 10"'$  (1.778 m)   Wt 229 lb (103.9 kg)   SpO2 99%   BMI 32.86 kg/m     Wt Readings from Last 3 Encounters:  10/22/22 229 lb (103.9 kg)  10/16/22 230 lb (104.3 kg)  04/30/22 240 lb 6.4 oz (109 kg)     GEN: Well nourished, well developed in no acute distress HEENT: Normal NECK: No carotid bruits CARDIAC: RRR, no rubs or gallops RESPIRATORY:  Clear to auscultation without rales, wheezing or rhonchi  ABDOMEN: Soft, non-tender, non-distended MUSCULOSKELETAL:  No edema; No deformity  SKIN: Warm and dry NEUROLOGIC:  Alert and oriented x 3 PSYCHIATRIC:  Normal affect   ASSESSMENT:    1. Essential hypertension   2. Pure hypercholesterolemia   3. Palpitations   4. Family history of premature CAD   5. Obesity (BMI 30.0-34.9)     PLAN:    In order of problems listed  above:  #Hypertension: Controlled with lifestyle modifications. Not requiring medications. -Continue lifestyle efforts -Goal<120/80s  #Noncardiac Chest Pain: Coronary CTA negative for obstructive disease with Ca score 0. TTE with normal LVEF, no valve disease. -Continue lifestyle modifications -Continue crestor '10mg'$  daily  #Palpitations: Cardiac monitor with rare PVCs and PACs, no significant arrthyhmias. Declined BB.  -Continue to monitor; if symptoms persist, can start BB  #HLD: Ca score 0 in 2019 with no significant disease on coronary CTA. Would like to repeat Ca score today -Repeat Ca score for monitoring -Continue crestor '10mg'$  daily -Discussed lifestyle modifications at length  #Obesity with BMI 37>32: -Has lost >30lbs with diet and exercise -Continue lifestyle modifications as below  Exercise recommendations: Goal of exercising for at least 30 minutes a day, at least 5 times per week.  Please exercise to a moderate exertion.  This means that while exercising it is difficult to speak in full sentences, however you are not so short of breath that you feel you must stop, and not so comfortable that you can carry on a full conversation.  Exertion level should be approximately a 5/10, if 10 is the most exertion you can perform.  Diet recommendations: Recommend a heart healthy diet such as the Mediterranean diet.  This diet consists of plant based foods, healthy fats, lean meats, olive oil.  It suggests limiting the intake of simple carbohydrates such as white breads, pastries, and pastas.  It also limits the amount of red meat, wine, and dairy products such as cheese that one should consume on a daily basis.    Medication Adjustments/Labs and Tests Ordered: Current medicines are reviewed at length with the patient today.  Concerns regarding medicines are outlined above.  No orders of the defined types were placed in this encounter.  No orders of the defined types were placed  in this encounter.   There are no Patient Instructions on file for this visit.    Signed, Freada Bergeron, MD  10/22/2022 8:39 AM    [place]

## 2022-10-22 ENCOUNTER — Ambulatory Visit: Payer: 59 | Attending: Cardiology | Admitting: Cardiology

## 2022-10-22 ENCOUNTER — Encounter: Payer: Self-pay | Admitting: Cardiology

## 2022-10-22 VITALS — BP 120/72 | HR 64 | Ht 70.0 in | Wt 229.0 lb

## 2022-10-22 DIAGNOSIS — R002 Palpitations: Secondary | ICD-10-CM

## 2022-10-22 DIAGNOSIS — Z8249 Family history of ischemic heart disease and other diseases of the circulatory system: Secondary | ICD-10-CM

## 2022-10-22 DIAGNOSIS — I10 Essential (primary) hypertension: Secondary | ICD-10-CM

## 2022-10-22 DIAGNOSIS — E669 Obesity, unspecified: Secondary | ICD-10-CM

## 2022-10-22 DIAGNOSIS — E7800 Pure hypercholesterolemia, unspecified: Secondary | ICD-10-CM

## 2022-10-22 DIAGNOSIS — Z79899 Other long term (current) drug therapy: Secondary | ICD-10-CM

## 2022-10-22 NOTE — Patient Instructions (Signed)
Medication Instructions:   Your physician recommends that you continue on your current medications as directed. Please refer to the Current Medication list given to you today.  *If you need a refill on your cardiac medications before your next appointment, please call your pharmacy*   Lab Work:  IN 3 MONTHS HERE IN THE OFFICE--CHECK LIPIDS AT THAT TIME--PLEASE COME FASTING TO THIS LAB APPOINTMENT  If you have labs (blood work) drawn today and your tests are completely normal, you will receive your results only by: [place] (if you have MyChart) OR A paper copy in the mail If you have any lab test that is abnormal or we need to change your treatment, we will call you to review the results.   Testing/Procedures:  CARDIAC CALCIUM SCORE (SELF PAY)    Follow-Up: At [facility], you and your health needs are our priority.  As part of our continuing mission to provide you with exceptional heart care, we have created designated Provider Care Teams.  These Care Teams include your primary Cardiologist (physician) and Advanced Practice Providers (APPs -  Physician Assistants and Nurse Practitioners) who all work together to provide you with the care you need, when you need it.  We recommend signing up for the patient portal called "MyChart".  Sign up information is provided on this After Visit Summary.  MyChart is used to connect with patients for Virtual Visits (Telemedicine).  Patients are able to view lab/test results, encounter notes, upcoming appointments, etc.  Non-urgent messages can be sent to your provider as well.   To learn more about what you can do with MyChart, go to [email or web address].    Your next appointment:   6 month(s)  Provider:   Freada Bergeron, MD

## 2022-10-23 ENCOUNTER — Ambulatory Visit: Payer: 59 | Admitting: Emergency Medicine

## 2022-10-26 LAB — TESTT+TESTF+SHBG
Sex Hormone Binding: 30 nmol/L (ref 19.3–76.4)
Testosterone, Total, LC/MS: 347.2 ng/dL (ref 264.0–916.0)

## 2022-11-05 ENCOUNTER — Ambulatory Visit (HOSPITAL_BASED_OUTPATIENT_CLINIC_OR_DEPARTMENT_OTHER)
Admission: RE | Admit: 2022-11-05 | Discharge: 2022-11-05 | Disposition: A | Discharge status: Home / Self Care | Payer: 59 | Source: Ambulatory Visit | Attending: Cardiology | Admitting: Cardiology

## 2022-11-05 DIAGNOSIS — E66811 Obesity, class 1: Secondary | ICD-10-CM

## 2022-11-05 DIAGNOSIS — R002 Palpitations: Secondary | ICD-10-CM

## 2022-11-05 DIAGNOSIS — E669 Obesity, unspecified: Secondary | ICD-10-CM | POA: Insufficient documentation

## 2022-11-05 DIAGNOSIS — E7800 Pure hypercholesterolemia, unspecified: Secondary | ICD-10-CM

## 2022-11-05 DIAGNOSIS — Z79899 Other long term (current) drug therapy: Secondary | ICD-10-CM

## 2022-11-05 DIAGNOSIS — I10 Essential (primary) hypertension: Secondary | ICD-10-CM

## 2022-11-05 DIAGNOSIS — Z8249 Family history of ischemic heart disease and other diseases of the circulatory system: Secondary | ICD-10-CM

## 2022-11-12 ENCOUNTER — Ambulatory Visit (HOSPITAL_BASED_OUTPATIENT_CLINIC_OR_DEPARTMENT_OTHER): Payer: 59

## 2022-11-20 ENCOUNTER — Other Ambulatory Visit: Payer: Self-pay

## 2022-11-20 DIAGNOSIS — Z8249 Family history of ischemic heart disease and other diseases of the circulatory system: Secondary | ICD-10-CM

## 2022-11-20 DIAGNOSIS — E785 Hyperlipidemia, unspecified: Secondary | ICD-10-CM

## 2022-11-20 DIAGNOSIS — Z79899 Other long term (current) drug therapy: Secondary | ICD-10-CM

## 2022-11-20 MED ORDER — ROSUVASTATIN CALCIUM 10 MG PO TABS: 10.0000 mg | ORAL_TABLET | Freq: Every day | ORAL | 3 refills | Status: DC

## 2022-12-10 ENCOUNTER — Ambulatory Visit: Payer: 59 | Admitting: Dermatology

## 2023-01-21 ENCOUNTER — Ambulatory Visit: Payer: 59 | Attending: Cardiology

## 2023-01-21 DIAGNOSIS — E7800 Pure hypercholesterolemia, unspecified: Secondary | ICD-10-CM

## 2023-01-21 DIAGNOSIS — E669 Obesity, unspecified: Secondary | ICD-10-CM

## 2023-01-21 DIAGNOSIS — Z8249 Family history of ischemic heart disease and other diseases of the circulatory system: Secondary | ICD-10-CM

## 2023-01-21 DIAGNOSIS — Z79899 Other long term (current) drug therapy: Secondary | ICD-10-CM

## 2023-01-21 DIAGNOSIS — R002 Palpitations: Secondary | ICD-10-CM

## 2023-01-21 DIAGNOSIS — I10 Essential (primary) hypertension: Secondary | ICD-10-CM

## 2023-01-21 LAB — LIPID PANEL
Chol/HDL Ratio: 2.9 ratio (ref 0.0–5.0)
Cholesterol, Total: 141 mg/dL (ref 100–199)
HDL: 48 mg/dL (ref >39)
LDL Chol Calc (NIH): 79 mg/dL (ref 0–99)
Triglycerides: 67 mg/dL (ref 0–149)
VLDL Cholesterol Cal: 14 mg/dL (ref 5–40)

## 2023-04-17 ENCOUNTER — Ambulatory Visit: Payer: 59 | Admitting: Emergency Medicine

## 2023-04-24 ENCOUNTER — Ambulatory Visit: Payer: 59 | Admitting: Cardiology

## 2023-04-28 NOTE — Progress Notes (Unsigned)
Cardiology Office Note:    Date:  05/02/2023   ID:  Christopher [name], DOB [birth year], MRN [id number]  PCP:  Georgina Quint, MD   [facility] Providers Cardiologist:  Meriam Sprague, MD {  History of Present Illness:    [name] is a 62 y.o. male with a FHx of CAD, hx of palpitations, dyslipidemia, HTN in past, GERD and prostate CA    2017 Holter monitor showed rare PVCs, infrequent PACs 2017 TTE LVEF 60 to 65% 2019;   Coronary CTA  Ca score 0  No signficant CAD  The pt made extensive lifestyle modifications after Dx of prostate Ca.  Lost weight, eating minimally processed foods    He was last seen by Jon Billings in Feb 2024   Went on to have a Ca score CT   Score was 0  The pt continues to do well   He denies CP   Breathing is good   Notes occasional isolated skip in heart beat. Exercises    Watches what he eats   Past Medical History:  Diagnosis Date   Cancer (HCC)    Phreesia 03/26/2020   GERD (gastroesophageal reflux disease) 10/31/2011   Hearing impaired 12/17/2013   lifelong    Hypogonadism male 10/31/2011   Prostate cancer (HCC)    PVC (premature ventricular contraction)     Past Surgical History:  Procedure Laterality Date   LYMPHADENECTOMY N/A 12/28/2019   Procedure: LYMPHADENECTOMY;  Surgeon: Heloise Purpura, MD;  Location: [facility];  Service: Urology;  Laterality: N/A;   PROSTATE BIOPSY     PROSTATE SURGERY N/A    Phreesia 03/26/2020   ROBOT ASSISTED LAPAROSCOPIC RADICAL PROSTATECTOMY N/A 12/28/2019   Procedure: XI ROBOTIC ASSISTED LAPAROSCOPIC RADICAL PROSTATECTOMY LEVEL 2;  Surgeon: Heloise Purpura, MD;  Location: [facility];  Service: Urology;  Laterality: N/A;   TONSILLECTOMY      Current Medications: Current Meds  Medication Sig   rosuvastatin (CRESTOR) 10 MG tablet Take 1 tablet (10 mg total) by mouth daily.     Allergies:   Asa arthritis strength-antacid [aspirin buffered], Penicillins, and Sulfa antibiotics   Social History    Socioeconomic History   Marital status: Married    Spouse name: Not on file   Number of children: 2   Years of education: Not on file   Highest education level: Not on file  Occupational History   Occupation: Probation officer: [employer]  Tobacco Use   Smoking status: Never   Smokeless tobacco: Never  Vaping Use   Vaping status: Never Used  Substance and Sexual Activity   Alcohol use: No   Drug use: No   Sexual activity: Yes  Other Topics Concern   Not on file  Social History Narrative   Married.  Education: Grade School.   Social Determinants of Health   Financial Resource Strain: Not on file  Food Insecurity: Not on file  Transportation Needs: Not on file  Physical Activity: Not on file  Stress: Not on file  Social Connections: Not on file     Family History: The patient's family history includes Heart disease in his mother; Hyperlipidemia in his father and mother; Prostate cancer in his father; Thyroid cancer in his sister. There is no history of Breast cancer, Pancreatic cancer, or [name] cancer.  ROS:   Please see the history of present illness.    Review of Systems  Constitutional:  Negative for fever and weight loss.  HENT:  Negative for sinus pain and tinnitus.   Eyes:  Negative for pain and discharge.  Respiratory:  Negative for cough, shortness of breath, wheezing and stridor.   Cardiovascular:  Positive for palpitations. Negative for chest pain, orthopnea, claudication, leg swelling and PND.  Gastrointestinal:  Negative for blood in stool and vomiting.  Genitourinary:  Negative for frequency and urgency.  Neurological:  Negative for dizziness and loss of consciousness.  Psychiatric/Behavioral:  Negative for depression.      EKGs/Labs/Other Studies Reviewed:    The following studies were reviewed today:  ETT: 02/2014 ETT Interpretation:  normal - no evidence of ischemia by ST analysis Comments: Patient presents today for  routine GXT. Has strong FH for CAD - he is obese. Has had some dyspnea and palpitations.    Today the patient exercised on the standard Bruce protocol for a total of 9 minutes.  Good exercise tolerance.  Adequate blood pressure response.  Clinically negative for chest pain. Test was stopped due to achievement of target HR.  EKG negative for ischemia. No significant arrhythmia noted.    48-Holter monitor Rare PVCs, infrequent PACs. No arrhythmias or significant pauses identified. Normal Holter monitor, no therapy necessary.   Accessory Clinical Findings   Echocardiogram - 2009,Overall left ventricular systolic function was normal. Left ventricular ejection fraction was estimated to be 60 %. There were no left ventricular regional wall motion abnormalities. - Estimated peak pulmonary artery systolic pressure 24 mmHg.   TTE: 01/25/2016 - Left ventricle: The cavity size was normal. Wall thickness was   normal. Systolic function was normal. The estimated ejection   fraction was in the range of 60% to 65%. Wall motion was normal;   there were no regional wall motion abnormalities. Left   ventricular diastolic function parameters were normal. - Mitral valve: Mildly thickened leaflets . There was trivial   regurgitation. - Left atrium: The atrium was normal in size. - Right atrium: The atrium was mildly dilated. - Inferior vena cava: The vessel was normal in size. The   respirophasic diameter changes were in the normal range (>= 50%),   consistent with normal central venous pressure.   Impressions: - LVEF 60-65%, normal wall thickness, normal wall motion, normal   diastolic function, normal LA size, mildly dilated RA, no   significant TR, normal IVC.      CCTA: 06/2018  1. Coronary calcium score of 0. This was 0 percentile for age and sex matched control.   2. Normal coronary origin with right dominance.   3. No evidence of CAD.   4.  Mildly dilated pulmonary artery measuring  32 mm.   CT Ca score   Feb 2024 Ca score 0  EKG:   ECG personally reviewed and shows sinus bradycardia, HR 57  Recent Labs: 10/16/2022: ALT 24; BUN 18; Creatinine, Ser 1.05; Hemoglobin 14.0; Platelets 238.0; Potassium 4.2; Sodium 138  Recent Lipid Panel    Component Value Date/Time   CHOL 141 01/21/2023 0811   TRIG 67 01/21/2023 0811   HDL 48 01/21/2023 0811   CHOLHDL 2.9 01/21/2023 0811   CHOLHDL 3 10/16/2022 0925   VLDL 17.8 10/16/2022 0925   LDLCALC 79 01/21/2023 0811    Physical Exam:    VS:  BP 126/76   Pulse (!) 57   Ht 5\' 10"  (1.778 m)   Wt 218 lb 12.8 oz (99.2 kg)   SpO2 99%   BMI 31.39 kg/m     Wt Readings from Last 3 Encounters:  05/02/23 218 lb 12.8 oz (99.2 kg)  10/22/22 229 lb (103.9 kg)  10/16/22 230 lb (104.3 kg)     GEN: Well nourished, well developed in no acute distress HEENT: Normal NECK: No carotid bruit CARDIAC: RRR, no murmur  RESPIRATORY:  Clear to auscultation bilaterally  ABDOMEN: Soft, non-tender, non-distended MUSCULOSKELETAL:  No edema;  SKIN: Warm and dry NEUROLOGIC:  Alert and oriented x 3 PSYCHIATRIC:  Normal affect      PLAN:    In order of problems listed above:  #Hx of HTN BP is controlled off of meds now that wight down  FOllow   #Hx of Chest Pain: Pt denies   # Dyslipidemia Will give trial off of Crestor   His last LDL  was 79 on Crestor Will check lipomed, Lpa and Apo B in March     #Palpitations: Monitor in past showed rare PVCs     Senses occasional brief skips  NOt concerning for signfiicant problem  .    Medication Adjustments/Labs and Tests Ordered: Current medicines are reviewed at length with the patient today.  Concerns regarding medicines are outlined above.  Orders Placed This Encounter  Procedures   EKG 12-Lead   No orders of the defined types were placed in this encounter.   There are no Patient Instructions on file for this visit.    Signed, Dietrich Pates, MD  05/02/2023 8:39 AM      [facility]

## 2023-05-02 ENCOUNTER — Encounter (INDEPENDENT_AMBULATORY_CARE_PROVIDER_SITE_OTHER): Payer: Self-pay

## 2023-05-02 ENCOUNTER — Encounter: Payer: Self-pay | Admitting: Internal Medicine

## 2023-05-02 ENCOUNTER — Ambulatory Visit: Payer: 59 | Attending: Cardiology | Admitting: Internal Medicine

## 2023-05-02 VITALS — BP 126/76 | HR 57 | Ht 70.0 in | Wt 218.8 lb

## 2023-05-02 DIAGNOSIS — Z79899 Other long term (current) drug therapy: Secondary | ICD-10-CM | POA: Diagnosis not present

## 2023-05-02 DIAGNOSIS — E7800 Pure hypercholesterolemia, unspecified: Secondary | ICD-10-CM | POA: Diagnosis not present

## 2023-05-02 DIAGNOSIS — R002 Palpitations: Secondary | ICD-10-CM

## 2023-05-02 DIAGNOSIS — E785 Hyperlipidemia, unspecified: Secondary | ICD-10-CM

## 2023-05-02 DIAGNOSIS — Z8249 Family history of ischemic heart disease and other diseases of the circulatory system: Secondary | ICD-10-CM | POA: Diagnosis not present

## 2023-05-02 NOTE — Patient Instructions (Signed)
Medication Instructions:  STOP CRESTOR/ ROSUVASTATIN   *If you need a refill on your cardiac medications before your next appointment, please call your pharmacy*   Lab Work: IN 6 MONTHS... FASTING NMR, APO B, LIPO A, HGBA1C, BMET   If you have labs (blood work) drawn today and your tests are completely normal, you will receive your results only by: MyChart Message (if you have MyChart) OR A paper copy in the mail If you have any lab test that is abnormal or we need to change your treatment, we will call you to review the results.   Testing/Procedures:    Follow-Up: At [facility], you and your health needs are our priority.  As part of our continuing mission to provide you with exceptional heart care, we have created designated Provider Care Teams.  These Care Teams include your primary Cardiologist (physician) and Advanced Practice Providers (APPs -  Physician Assistants and Nurse Practitioners) who all work together to provide you with the care you need, when you need it.  We recommend signing up for the patient portal called "MyChart".  Sign up information is provided on this After Visit Summary.  MyChart is used to connect with patients for Virtual Visits (Telemedicine).  Patients are able to view lab/test results, encounter notes, upcoming appointments, etc.  Non-urgent messages can be sent to your provider as well.   To learn more about what you can do with MyChart, go to [email or web address].    Your next appointment:   1 year(s)  Provider:   DR Dietrich Pates   Other Instructions

## 2023-05-27 ENCOUNTER — Encounter: Payer: Self-pay | Admitting: Emergency Medicine

## 2023-05-27 ENCOUNTER — Encounter: Payer: Self-pay | Admitting: Internal Medicine

## 2023-05-27 ENCOUNTER — Ambulatory Visit: Payer: 59 | Admitting: Emergency Medicine

## 2023-05-27 VITALS — BP 118/72 | HR 56 | Temp 97.9°F | Wt 209.8 lb

## 2023-05-27 DIAGNOSIS — E785 Hyperlipidemia, unspecified: Secondary | ICD-10-CM

## 2023-05-27 DIAGNOSIS — Z8546 Personal history of malignant neoplasm of prostate: Secondary | ICD-10-CM | POA: Diagnosis not present

## 2023-05-27 DIAGNOSIS — E7800 Pure hypercholesterolemia, unspecified: Secondary | ICD-10-CM

## 2023-05-27 DIAGNOSIS — Z23 Encounter for immunization: Secondary | ICD-10-CM

## 2023-05-27 DIAGNOSIS — Z8249 Family history of ischemic heart disease and other diseases of the circulatory system: Secondary | ICD-10-CM

## 2023-05-27 DIAGNOSIS — R030 Elevated blood-pressure reading, without diagnosis of hypertension: Secondary | ICD-10-CM | POA: Diagnosis not present

## 2023-05-27 LAB — LIPID PANEL
Cholesterol: 179 mg/dL (ref 0–200)
HDL: 43.5 mg/dL (ref >39.00)
LDL Cholesterol: 122 mg/dL — ABNORMAL HIGH (ref 0–99)
NonHDL: 135.64
Total CHOL/HDL Ratio: 4
Triglycerides: 67 mg/dL (ref 0.0–149.0)
VLDL: 13.4 mg/dL (ref 0.0–40.0)

## 2023-05-27 LAB — COMPREHENSIVE METABOLIC PANEL
ALT: 25 U/L (ref 0–53)
AST: 29 U/L (ref 0–37)
Albumin: 4 g/dL (ref 3.5–5.2)
Alkaline Phosphatase: 64 U/L (ref 39–117)
BUN: 21 mg/dL (ref 6–23)
CO2: 29 meq/L (ref 19–32)
Calcium: 9.5 mg/dL (ref 8.4–10.5)
Chloride: 104 meq/L (ref 96–112)
Creatinine, Ser: 1.13 mg/dL (ref 0.40–1.50)
GFR: 69.99 mL/min (ref >60.00)
Glucose, Bld: 82 mg/dL (ref 70–99)
Potassium: 4 meq/L (ref 3.5–5.1)
Sodium: 140 meq/L (ref 135–145)
Total Bilirubin: 1.4 mg/dL — ABNORMAL HIGH (ref 0.2–1.2)
Total Protein: 7.2 g/dL (ref 6.0–8.3)

## 2023-05-27 LAB — CBC WITH DIFFERENTIAL/PLATELET
Basophils Absolute: 0 10*3/uL (ref 0.0–0.1)
Basophils Relative: 0.4 % (ref 0.0–3.0)
Eosinophils Absolute: 0.2 10*3/uL (ref 0.0–0.7)
Eosinophils Relative: 3.3 % (ref 0.0–5.0)
HCT: 42.9 % (ref 39.0–52.0)
Hemoglobin: 14 g/dL (ref 13.0–17.0)
Lymphocytes Relative: 38.1 % (ref 12.0–46.0)
Lymphs Abs: 2.2 10*3/uL (ref 0.7–4.0)
MCHC: 32.7 g/dL (ref 30.0–36.0)
MCV: 86.6 fl (ref 78.0–100.0)
Monocytes Absolute: 0.6 10*3/uL (ref 0.1–1.0)
Monocytes Relative: 10.1 % (ref 3.0–12.0)
Neutro Abs: 2.8 10*3/uL (ref 1.4–7.7)
Neutrophils Relative %: 48.1 % (ref 43.0–77.0)
Platelets: 248 10*3/uL (ref 150.0–400.0)
RBC: 4.95 Mil/uL (ref 4.22–5.81)
RDW: 13.1 % (ref 11.5–15.5)
WBC: 5.8 10*3/uL (ref 4.0–10.5)

## 2023-05-27 LAB — HEMOGLOBIN A1C: Hgb A1c MFr Bld: 5.7 % (ref 4.6–6.5)

## 2023-05-27 LAB — PSA: PSA: 0.09 ng/mL — ABNORMAL LOW (ref 0.10–4.00)

## 2023-05-27 NOTE — Assessment & Plan Note (Signed)
BP Readings from Last 3 Encounters:  05/27/23 118/72  05/02/23 126/76  10/22/22 120/72  Normotensive, off medications.

## 2023-05-27 NOTE — Assessment & Plan Note (Addendum)
Eating better and losing weight. Wt Readings from Last 3 Encounters:  05/27/23 209 lb 12.8 oz (95.2 kg)  05/02/23 218 lb 12.8 oz (99.2 kg)  10/22/22 229 lb (103.9 kg)  Exercises regularly. Feels a lot better. Off medications. Diet and nutrition discussed Cardiovascular risks associated with dyslipidemia discussed. Lipid profile done today.

## 2023-05-27 NOTE — Progress Notes (Signed)
Christopher [name] 62 y.o.   Chief Complaint  Patient presents with   Follow-up    6 month follow up    HISTORY OF PRESENT ILLNESS: This is a 62 y.o. male here for 33-month follow-up of chronic medical conditions. Overall doing well. Has no complaints or medical complaints today. Wt Readings from Last 3 Encounters:  05/02/23 218 lb 12.8 oz (99.2 kg)  10/22/22 229 lb (103.9 kg)  10/16/22 230 lb (104.3 kg)  Last cardiologist office visit assessment and plan as follows: PLAN:     In order of problems listed above:   #Hx of HTN BP is controlled off of meds now that wight down  FOllow    #Hx of Chest Pain: Pt denies    # Dyslipidemia Will give trial off of Crestor   His last LDL  was 79 on Crestor Will check lipomed, Lpa and Apo B in March      #Palpitations: Monitor in past showed rare PVCs     Senses occasional brief skips  NOt concerning for signfiicant problem     HPI   Prior to Admission medications   Not on File    Allergies  Allergen Reactions   Asa Arthritis Strength-Antacid [Aspirin Buffered] Swelling   Penicillins Hives and Other (See Comments)    Did it involve swelling of the face/tongue/throat, SOB, or low BP? No Did it involve sudden or severe rash/hives, skin peeling, or any reaction on the inside of your mouth or nose? No Did you need to seek medical attention at a hospital or doctor's office? No When did it last happen?   childhood    If all above answers are "NO", may proceed with cephalosporin use.  Did it involve swelling of the face/tongue/throat, SOB, or low BP? No Did it involve sudden or severe rash/hives, skin peeling, or any reaction on the inside of your mouth or nose? No Did you need to seek medical attention at a hospital or doctor's office? No When did it last happen?   childhood    If all above answers are "NO", may proceed with cephalosporin use.   Sulfa Antibiotics Hives    Patient Active Problem List   Diagnosis Date  Noted   Family history of thyroid cancer 02/22/2022   History of prostate cancer 02/22/2022   Transient hypertension 02/21/2021   Prostate cancer (HCC) 12/28/2019   Malignant neoplasm of prostate (HCC) 11/17/2019   Family history of premature CAD 01/13/2016   Dyslipidemia 01/13/2016   Low testosterone in male 05/07/2014   Hearing impaired 12/17/2013    Past Medical History:  Diagnosis Date   Cancer (HCC)    [facility] 03/26/2020   GERD (gastroesophageal reflux disease) 10/31/2011   Hearing impaired 12/17/2013   lifelong    Hypogonadism male 10/31/2011   Prostate cancer (HCC)    PVC (premature ventricular contraction)     Past Surgical History:  Procedure Laterality Date   LYMPHADENECTOMY N/A 12/28/2019   Procedure: LYMPHADENECTOMY;  Surgeon: Heloise Purpura, MD;  Location: [facility];  Service: Urology;  Laterality: N/A;   PROSTATE BIOPSY     PROSTATE SURGERY N/A    [facility] 03/26/2020   ROBOT ASSISTED LAPAROSCOPIC RADICAL PROSTATECTOMY N/A 12/28/2019   Procedure: XI ROBOTIC ASSISTED LAPAROSCOPIC RADICAL PROSTATECTOMY LEVEL 2;  Surgeon: Heloise Purpura, MD;  Location: [facility];  Service: Urology;  Laterality: N/A;   TONSILLECTOMY      Social History   Socioeconomic History   Marital status: Married  Spouse name: Not on file   Number of children: 2   Years of education: Not on file   Highest education level: Master's degree (e.g., MA, MS, MEng, MEd, MSW, MBA)  Occupational History   Occupation: Probation officer: [employer]  Tobacco Use   Smoking status: Never   Smokeless tobacco: Never  Vaping Use   Vaping status: Never Used  Substance and Sexual Activity   Alcohol use: No   Drug use: No   Sexual activity: Yes  Other Topics Concern   Not on file  Social History Narrative   Married.  Education: Grade School.   Social Determinants of Health   Financial Resource Strain: Patient Declined (05/23/2023)   Overall Financial Resource Strain (CARDIA)     Difficulty of Paying Living Expenses: Patient declined  Food Insecurity: Patient Declined (05/23/2023)   Hunger Vital Sign    Worried About Running Out of Food in the Last Year: Patient declined    Ran Out of Food in the Last Year: Patient declined  Transportation Needs: No Transportation Needs (05/23/2023)   PRAPARE - Administrator, Civil Service (Medical): No    Lack of Transportation (Non-Medical): No  Physical Activity: Sufficiently Active (05/23/2023)   Exercise Vital Sign    Days of Exercise per Week: 6 days    Minutes of Exercise per Session: 50 min  Stress: No Stress Concern Present (05/23/2023)   [employer] of Occupational Health - Occupational Stress Questionnaire    Feeling of Stress : Only a little  Social Connections: Unknown (05/23/2023)   Social Connection and Isolation Panel [NHANES]    Frequency of Communication with Friends and Family: Twice a week    Frequency of Social Gatherings with Friends and Family: Once a week    Attends Religious Services: Patient declined    Database administrator or Organizations: No    Attends Engineer, structural: Not on file    Marital Status: Patient declined  Catering manager Violence: Not on file    Family History  Problem Relation Age of Onset   Hyperlipidemia Father    Prostate cancer Father        treated with ADT and XRT   Heart disease Mother    Hyperlipidemia Mother    Thyroid cancer Sister    Breast cancer Neg Hx    Pancreatic cancer Neg Hx    [name] cancer Neg Hx      Review of Systems  Constitutional: Negative.  Negative for chills and fever.  HENT: Negative.  Negative for congestion and sore throat.   Respiratory: Negative.  Negative for cough and shortness of breath.   Cardiovascular: Negative.  Negative for chest pain and palpitations.  Gastrointestinal: Negative.  Negative for abdominal pain, diarrhea, nausea and vomiting.  Genitourinary: Negative.  Negative for dysuria and hematuria.   Skin: Negative.  Negative for rash.  Neurological: Negative.  Negative for dizziness and headaches.  All other systems reviewed and are negative.   Today's Vitals   05/27/23 0924  BP: 118/72  Pulse: (!) 56  Temp: 97.9 F (36.6 C)  TempSrc: Oral  SpO2: 100%  Weight: 209 lb 12.8 oz (95.2 kg)   Body mass index is 30.1 kg/m. Wt Readings from Last 3 Encounters:  05/27/23 209 lb 12.8 oz (95.2 kg)  05/02/23 218 lb 12.8 oz (99.2 kg)  10/22/22 229 lb (103.9 kg)     Physical Exam Vitals reviewed.  Constitutional:  Appearance: Normal appearance.  HENT:     Head: Normocephalic.  Eyes:     Extraocular Movements: Extraocular movements intact.  Cardiovascular:     Rate and Rhythm: Normal rate and regular rhythm.     Pulses: Normal pulses.     Heart sounds: Normal heart sounds.  Pulmonary:     Effort: Pulmonary effort is normal.     Breath sounds: Normal breath sounds.  Abdominal:     Palpations: Abdomen is soft.     Tenderness: There is no abdominal tenderness.  Musculoskeletal:     Cervical back: No tenderness.  Lymphadenopathy:     Cervical: No cervical adenopathy.  Skin:    General: Skin is warm and dry.     Capillary Refill: Capillary refill takes less than 2 seconds.  Neurological:     General: No focal deficit present.     Mental Status: He is alert and oriented to person, place, and time.  Psychiatric:        Mood and Affect: Mood normal.        Behavior: Behavior normal.      ASSESSMENT & PLAN: A total of 44 minutes was spent with the patient and counseling/coordination of care regarding preparing for this visit, review of most recent office visit notes, review of most recent cardiologist office visit notes, review of chronic medical conditions under management, review of all medications, review of most recent blood work results, cardiovascular risks associated with dyslipidemia, education on nutrition, prognosis, documentation, and need for  follow-up.  Problem List Items Addressed This Visit       Cardiovascular and Mediastinum   Transient hypertension    BP Readings from Last 3 Encounters:  05/27/23 118/72  05/02/23 126/76  10/22/22 120/72  Normotensive, off medications.         Other   Dyslipidemia - Primary    Eating better and losing weight. Wt Readings from Last 3 Encounters:  05/27/23 209 lb 12.8 oz (95.2 kg)  05/02/23 218 lb 12.8 oz (99.2 kg)  10/22/22 229 lb (103.9 kg)  Exercises regularly. Feels a lot better. Off medications. Diet and nutrition discussed Cardiovascular risks associated with dyslipidemia discussed. Lipid profile done today.      Relevant Orders   Comprehensive metabolic panel   CBC with Differential/Platelet   Lipid panel   Hemoglobin A1c   History of prostate cancer    Clinically stable.  Asymptomatic. PSA done today as requested.      Relevant Orders   PSA   Other Visit Diagnoses     Need for vaccination       Relevant Orders   Flu vaccine trivalent PF, 6mos and older(Flulaval,Afluria,Fluarix,Fluzone) (Completed)   Tdap vaccine greater than or equal to 7yo IM (Completed)      Patient Instructions  Health Maintenance, Male Adopting a healthy lifestyle and getting preventive care are important in promoting health and wellness. Ask your health care provider about: The right schedule for you to have regular tests and exams. Things you can do on your own to prevent diseases and keep yourself healthy. What should I know about diet, weight, and exercise? Eat a healthy diet  Eat a diet that includes plenty of vegetables, fruits, low-fat dairy products, and lean protein. Do not eat a lot of foods that are high in solid fats, added sugars, or sodium. Maintain a healthy weight Body mass index (BMI) is a measurement that can be used to identify possible weight problems. It estimates body fat based  on height and weight. Your health care provider can help determine your BMI  and help you achieve or maintain a healthy weight. Get regular exercise Get regular exercise. This is one of the most important things you can do for your health. Most adults should: Exercise for at least 150 minutes each week. The exercise should increase your heart rate and make you sweat (moderate-intensity exercise). Do strengthening exercises at least twice a week. This is in addition to the moderate-intensity exercise. Spend less time sitting. Even light physical activity can be beneficial. Watch cholesterol and blood lipids Have your blood tested for lipids and cholesterol at 62 years of age, then have this test every 5 years. You may need to have your cholesterol levels checked more often if: Your lipid or cholesterol levels are high. You are older than 62 years of age. You are at high risk for heart disease. What should I know about cancer screening? Many types of cancers can be detected early and may often be prevented. Depending on your health history and family history, you may need to have cancer screening at various ages. This may include screening for: Colorectal cancer. Prostate cancer. Skin cancer. Lung cancer. What should I know about heart disease, diabetes, and high blood pressure? Blood pressure and heart disease High blood pressure causes heart disease and increases the risk of stroke. This is more likely to develop in people who have high blood pressure readings or are overweight. Talk with your health care provider about your target blood pressure readings. Have your blood pressure checked: Every 3-5 years if you are 12-34 years of age. Every year if you are 65 years old or older. If you are between the ages of 77 and 1 and are a current or former smoker, ask your health care provider if you should have a one-time screening for abdominal aortic aneurysm (AAA). Diabetes Have regular diabetes screenings. This checks your fasting blood sugar level. Have the screening  done: Once every three years after age 34 if you are at a normal weight and have a low risk for diabetes. More often and at a younger age if you are overweight or have a high risk for diabetes. What should I know about preventing infection? Hepatitis B If you have a higher risk for hepatitis B, you should be screened for this virus. Talk with your health care provider to find out if you are at risk for hepatitis B infection. Hepatitis C Blood testing is recommended for: Everyone born from [birth year] through 1965. Anyone with known risk factors for hepatitis C. Sexually transmitted infections (STIs) You should be screened each year for STIs, including gonorrhea and chlamydia, if: You are sexually active and are younger than 62 years of age. You are older than 62 years of age and your health care provider tells you that you are at risk for this type of infection. Your sexual activity has changed since you were last screened, and you are at increased risk for chlamydia or gonorrhea. Ask your health care provider if you are at risk. Ask your health care provider about whether you are at high risk for HIV. Your health care provider may recommend a prescription medicine to help prevent HIV infection. If you choose to take medicine to prevent HIV, you should first get tested for HIV. You should then be tested every 3 months for as long as you are taking the medicine. Follow these instructions at home: Alcohol use Do not drink alcohol if your  health care provider tells you not to drink. If you drink alcohol: Limit how much you have to 0-2 drinks a day. Know how much alcohol is in your drink. In the [place] one drink equals one 12 oz bottle of beer (355 mL), one 5 oz glass of wine (148 mL), or one 1 oz glass of hard liquor (44 mL). Lifestyle Do not use any products that contain nicotine or tobacco. These products include cigarettes, chewing tobacco, and vaping devices, such as e-cigarettes. If you need help  quitting, ask your health care provider. Do not use street drugs. Do not share needles. Ask your health care provider for help if you need support or information about quitting drugs. General instructions Schedule regular health, dental, and eye exams. Stay current with your vaccines. Tell your health care provider if: You often feel depressed. You have ever been abused or do not feel safe at home. Summary Adopting a healthy lifestyle and getting preventive care are important in promoting health and wellness. Follow your health care provider's instructions about healthy diet, exercising, and getting tested or screened for diseases. Follow your health care provider's instructions on monitoring your cholesterol and blood pressure. This information is not intended to replace advice given to you by your health care provider. Make sure you discuss any questions you have with your health care provider. Document Revised: 01/23/2021 Document Reviewed: 01/23/2021 Elsevier Patient Education  2024 Elsevier Inc.    Edwina Barth, MD [facility]

## 2023-05-27 NOTE — Patient Instructions (Signed)
Health Maintenance, Male Adopting a healthy lifestyle and getting preventive care are important in promoting health and wellness. Ask your health care provider about: The right schedule for you to have regular tests and exams. Things you can do on your own to prevent diseases and keep yourself healthy. What should I know about diet, weight, and exercise? Eat a healthy diet  Eat a diet that includes plenty of vegetables, fruits, low-fat dairy products, and lean protein. Do not eat a lot of foods that are high in solid fats, added sugars, or sodium. Maintain a healthy weight Body mass index (BMI) is a measurement that can be used to identify possible weight problems. It estimates body fat based on height and weight. Your health care provider can help determine your BMI and help you achieve or maintain a healthy weight. Get regular exercise Get regular exercise. This is one of the most important things you can do for your health. Most adults should: Exercise for at least 150 minutes each week. The exercise should increase your heart rate and make you sweat (moderate-intensity exercise). Do strengthening exercises at least twice a week. This is in addition to the moderate-intensity exercise. Spend less time sitting. Even light physical activity can be beneficial. Watch cholesterol and blood lipids Have your blood tested for lipids and cholesterol at 62 years of age, then have this test every 5 years. You may need to have your cholesterol levels checked more often if: Your lipid or cholesterol levels are high. You are older than 62 years of age. You are at high risk for heart disease. What should I know about cancer screening? Many types of cancers can be detected early and may often be prevented. Depending on your health history and family history, you may need to have cancer screening at various ages. This may include screening for: Colorectal cancer. Prostate cancer. Skin cancer. Lung  cancer. What should I know about heart disease, diabetes, and high blood pressure? Blood pressure and heart disease High blood pressure causes heart disease and increases the risk of stroke. This is more likely to develop in people who have high blood pressure readings or are overweight. Talk with your health care provider about your target blood pressure readings. Have your blood pressure checked: Every 3-5 years if you are 18-39 years of age. Every year if you are 40 years old or older. If you are between the ages of 65 and 75 and are a current or former smoker, ask your health care provider if you should have a one-time screening for abdominal aortic aneurysm (AAA). Diabetes Have regular diabetes screenings. This checks your fasting blood sugar level. Have the screening done: Once every three years after age 45 if you are at a normal weight and have a low risk for diabetes. More often and at a younger age if you are overweight or have a high risk for diabetes. What should I know about preventing infection? Hepatitis B If you have a higher risk for hepatitis B, you should be screened for this virus. Talk with your health care provider to find out if you are at risk for hepatitis B infection. Hepatitis C Blood testing is recommended for: Everyone born from 1945 through 1965. Anyone with known risk factors for hepatitis C. Sexually transmitted infections (STIs) You should be screened each year for STIs, including gonorrhea and chlamydia, if: You are sexually active and are younger than 62 years of age. You are older than 62 years of age and your   health care provider tells you that you are at risk for this type of infection. Your sexual activity has changed since you were last screened, and you are at increased risk for chlamydia or gonorrhea. Ask your health care provider if you are at risk. Ask your health care provider about whether you are at high risk for HIV. Your health care provider  may recommend a prescription medicine to help prevent HIV infection. If you choose to take medicine to prevent HIV, you should first get tested for HIV. You should then be tested every 3 months for as long as you are taking the medicine. Follow these instructions at home: Alcohol use Do not drink alcohol if your health care provider tells you not to drink. If you drink alcohol: Limit how much you have to 0-2 drinks a day. Know how much alcohol is in your drink. In the [place] one drink equals one 12 oz bottle of beer (355 mL), one 5 oz glass of wine (148 mL), or one 1 oz glass of hard liquor (44 mL). Lifestyle Do not use any products that contain nicotine or tobacco. These products include cigarettes, chewing tobacco, and vaping devices, such as e-cigarettes. If you need help quitting, ask your health care provider. Do not use street drugs. Do not share needles. Ask your health care provider for help if you need support or information about quitting drugs. General instructions Schedule regular health, dental, and eye exams. Stay current with your vaccines. Tell your health care provider if: You often feel depressed. You have ever been abused or do not feel safe at home. Summary Adopting a healthy lifestyle and getting preventive care are important in promoting health and wellness. Follow your health care provider's instructions about healthy diet, exercising, and getting tested or screened for diseases. Follow your health care provider's instructions on monitoring your cholesterol and blood pressure. This information is not intended to replace advice given to you by your health care provider. Make sure you discuss any questions you have with your health care provider. Document Revised: 01/23/2021 Document Reviewed: 01/23/2021 Elsevier Patient Education  2024 Elsevier Inc.  

## 2023-05-27 NOTE — Assessment & Plan Note (Signed)
Clinically stable.  Asymptomatic. PSA done today as requested.

## 2023-05-28 ENCOUNTER — Encounter: Payer: Self-pay | Admitting: Emergency Medicine

## 2023-05-30 NOTE — Addendum Note (Signed)
Addended by: Lendon Ka on: 05/30/2023 08:22 AM   Modules accepted: Orders

## 2023-05-30 NOTE — Telephone Encounter (Signed)
Pricilla Riffle, MD  Lars Mage, RN11 hours ago (9:06 PM)   OK to order   _____________________________________________________  Orders placed.  Updated patient.

## 2023-05-31 ENCOUNTER — Ambulatory Visit: Payer: 59 | Attending: Internal Medicine

## 2023-05-31 DIAGNOSIS — Z79899 Other long term (current) drug therapy: Secondary | ICD-10-CM

## 2023-05-31 DIAGNOSIS — R002 Palpitations: Secondary | ICD-10-CM

## 2023-05-31 DIAGNOSIS — E7800 Pure hypercholesterolemia, unspecified: Secondary | ICD-10-CM

## 2023-05-31 DIAGNOSIS — Z8249 Family history of ischemic heart disease and other diseases of the circulatory system: Secondary | ICD-10-CM

## 2023-05-31 DIAGNOSIS — E785 Hyperlipidemia, unspecified: Secondary | ICD-10-CM

## 2023-06-04 LAB — BASIC METABOLIC PANEL WITH GFR
BUN/Creatinine Ratio: 13 (ref 10–24)
BUN: 15 mg/dL (ref 8–27)
CO2: 25 mmol/L (ref 20–29)
Calcium: 9.4 mg/dL (ref 8.6–10.2)
Chloride: 102 mmol/L (ref 96–106)
Creatinine, Ser: 1.18 mg/dL (ref 0.76–1.27)
Glucose: 95 mg/dL (ref 70–99)
Potassium: 4.7 mmol/L (ref 3.5–5.2)
Sodium: 138 mmol/L (ref 134–144)
eGFR: 70 mL/min/1.73

## 2023-06-04 LAB — NMR, LIPOPROFILE
Cholesterol, Total: 181 mg/dL (ref 100–199)
HDL Particle Number: 21.3 umol/L — ABNORMAL LOW
HDL-C: 43 mg/dL
LDL Particle Number: 1421 nmol/L — ABNORMAL HIGH
LDL Size: 21.2 nm
LDL-C (NIH Calc): 125 mg/dL — ABNORMAL HIGH (ref 0–99)
LP-IR Score: <25
Small LDL Particle Number: 448 nmol/L
Triglycerides: 68 mg/dL (ref 0–149)

## 2023-06-04 LAB — HEMOGLOBIN A1C
Est. average glucose Bld gHb Est-mCnc: 117 mg/dL
Hgb A1c MFr Bld: 5.7 % — ABNORMAL HIGH (ref 4.8–5.6)

## 2023-06-04 LAB — APOLIPOPROTEIN B: Apolipoprotein B: 98 mg/dL — ABNORMAL HIGH

## 2023-06-04 LAB — LIPOPROTEIN A (LPA): Lipoprotein (a): 20.5 nmol/L (ref <75.0)

## 2023-07-03 MED ORDER — ROSUVASTATIN CALCIUM 10 MG PO TABS: 10.0000 mg | ORAL_TABLET | Freq: Every day | ORAL | 3 refills | Status: DC

## 2023-07-03 NOTE — Addendum Note (Signed)
Addended by: Bertram Millard on: 07/03/2023 04:52 PM   Modules accepted: Orders

## 2023-09-06 ENCOUNTER — Other Ambulatory Visit (HOSPITAL_COMMUNITY): Payer: Self-pay

## 2023-09-13 ENCOUNTER — Other Ambulatory Visit (HOSPITAL_COMMUNITY): Payer: Self-pay

## 2023-09-24 ENCOUNTER — Encounter: Payer: Self-pay | Admitting: Internal Medicine

## 2023-09-24 DIAGNOSIS — Z8249 Family history of ischemic heart disease and other diseases of the circulatory system: Secondary | ICD-10-CM

## 2023-09-24 DIAGNOSIS — E785 Hyperlipidemia, unspecified: Secondary | ICD-10-CM

## 2023-09-24 DIAGNOSIS — Z79899 Other long term (current) drug therapy: Secondary | ICD-10-CM

## 2023-09-24 DIAGNOSIS — E7800 Pure hypercholesterolemia, unspecified: Secondary | ICD-10-CM

## 2023-09-27 ENCOUNTER — Other Ambulatory Visit: Payer: 59

## 2023-09-27 DIAGNOSIS — E7800 Pure hypercholesterolemia, unspecified: Secondary | ICD-10-CM

## 2023-09-27 DIAGNOSIS — Z8249 Family history of ischemic heart disease and other diseases of the circulatory system: Secondary | ICD-10-CM

## 2023-10-02 LAB — NMR, LIPOPROFILE
Cholesterol, Total: 127 mg/dL (ref 100–199)
HDL Particle Number: 29.7 umol/L — ABNORMAL LOW (ref >=30.5)
HDL-C: 50 mg/dL (ref >39)
LDL Particle Number: 768 nmol/L (ref <1000)
LDL Size: 20.3 nmol — ABNORMAL LOW (ref >20.5)
LDL-C (NIH Calc): 66 mg/dL (ref 0–99)
LP-IR Score: 34 (ref <=45)
Small LDL Particle Number: 459 nmol/L (ref <=527)
Triglycerides: 48 mg/dL (ref 0–149)

## 2023-10-02 LAB — LIPOPROTEIN A (LPA): Lipoprotein (a): 20.9 nmol/L (ref <75.0)

## 2023-10-02 LAB — APOLIPOPROTEIN B: Apolipoprotein B: 61 mg/dL (ref <90)

## 2023-10-28 ENCOUNTER — Other Ambulatory Visit: Payer: 59

## 2023-11-12 ENCOUNTER — Encounter: Payer: Self-pay | Admitting: Emergency Medicine

## 2023-11-12 ENCOUNTER — Ambulatory Visit: Payer: 59 | Admitting: Emergency Medicine

## 2023-11-12 VITALS — BP 122/72 | HR 62 | Temp 98.1°F | Ht 70.0 in | Wt 207.0 lb

## 2023-11-12 DIAGNOSIS — C61 Malignant neoplasm of prostate: Secondary | ICD-10-CM | POA: Diagnosis not present

## 2023-11-12 DIAGNOSIS — Z8546 Personal history of malignant neoplasm of prostate: Secondary | ICD-10-CM | POA: Diagnosis not present

## 2023-11-12 DIAGNOSIS — E785 Hyperlipidemia, unspecified: Secondary | ICD-10-CM

## 2023-11-12 LAB — CBC WITH DIFFERENTIAL/PLATELET
Basophils Absolute: 0 10*3/uL (ref 0.0–0.1)
Basophils Relative: 0.6 % (ref 0.0–3.0)
Eosinophils Absolute: 0.2 10*3/uL (ref 0.0–0.7)
Eosinophils Relative: 3.5 % (ref 0.0–5.0)
HCT: 43.4 % (ref 39.0–52.0)
Hemoglobin: 14.3 g/dL (ref 13.0–17.0)
Lymphocytes Relative: 31.2 % (ref 12.0–46.0)
Lymphs Abs: 1.4 10*3/uL (ref 0.7–4.0)
MCHC: 32.9 g/dL (ref 30.0–36.0)
MCV: 88 fL (ref 78.0–100.0)
Monocytes Absolute: 0.6 10*3/uL (ref 0.1–1.0)
Monocytes Relative: 13.2 % — ABNORMAL HIGH (ref 3.0–12.0)
Neutro Abs: 2.3 10*3/uL (ref 1.4–7.7)
Neutrophils Relative %: 51.5 % (ref 43.0–77.0)
Platelets: 216 10*3/uL (ref 150.0–400.0)
RBC: 4.92 Mil/uL (ref 4.22–5.81)
RDW: 12.9 % (ref 11.5–15.5)
WBC: 4.4 10*3/uL (ref 4.0–10.5)

## 2023-11-12 LAB — COMPREHENSIVE METABOLIC PANEL
ALT: 16 U/L (ref 0–53)
AST: 17 U/L (ref 0–37)
Albumin: 4.2 g/dL (ref 3.5–5.2)
Alkaline Phosphatase: 63 U/L (ref 39–117)
BUN: 19 mg/dL (ref 6–23)
CO2: 30 meq/L (ref 19–32)
Calcium: 9.4 mg/dL (ref 8.4–10.5)
Chloride: 103 meq/L (ref 96–112)
Creatinine, Ser: 1.13 mg/dL (ref 0.40–1.50)
GFR: 69.76 mL/min (ref >60.00)
Glucose, Bld: 101 mg/dL — ABNORMAL HIGH (ref 70–99)
Potassium: 4.7 meq/L (ref 3.5–5.1)
Sodium: 139 meq/L (ref 135–145)
Total Bilirubin: 1.1 mg/dL (ref 0.2–1.2)
Total Protein: 7.2 g/dL (ref 6.0–8.3)

## 2023-11-12 LAB — HEMOGLOBIN A1C: Hgb A1c MFr Bld: 5.7 % (ref 4.6–6.5)

## 2023-11-12 LAB — PSA: PSA: 0.14 ng/mL (ref 0.10–4.00)

## 2023-11-12 NOTE — Patient Instructions (Signed)
 Health Maintenance, Male  Adopting a healthy lifestyle and getting preventive care are important in promoting health and wellness. Ask your health care provider about:  The right schedule for you to have regular tests and exams.  Things you can do on your own to prevent diseases and keep yourself healthy.  What should I know about diet, weight, and exercise?  Eat a healthy diet    Eat a diet that includes plenty of vegetables, fruits, low-fat dairy products, and lean protein.  Do not eat a lot of foods that are high in solid fats, added sugars, or sodium.  Maintain a healthy weight  Body mass index (BMI) is a measurement that can be used to identify possible weight problems. It estimates body fat based on height and weight. Your health care provider can help determine your BMI and help you achieve or maintain a healthy weight.  Get regular exercise  Get regular exercise. This is one of the most important things you can do for your health. Most adults should:  Exercise for at least 150 minutes each week. The exercise should increase your heart rate and make you sweat (moderate-intensity exercise).  Do strengthening exercises at least twice a week. This is in addition to the moderate-intensity exercise.  Spend less time sitting. Even light physical activity can be beneficial.  Watch cholesterol and blood lipids  Have your blood tested for lipids and cholesterol at 63 years of age, then have this test every 5 years.  You may need to have your cholesterol levels checked more often if:  Your lipid or cholesterol levels are high.  You are older than 63 years of age.  You are at high risk for heart disease.  What should I know about cancer screening?  Many types of cancers can be detected early and may often be prevented. Depending on your health history and family history, you may need to have cancer screening at various ages. This may include screening for:  Colorectal cancer.  Prostate cancer.  Skin cancer.  Lung  cancer.  What should I know about heart disease, diabetes, and high blood pressure?  Blood pressure and heart disease  High blood pressure causes heart disease and increases the risk of stroke. This is more likely to develop in people who have high blood pressure readings or are overweight.  Talk with your health care provider about your target blood pressure readings.  Have your blood pressure checked:  Every 3-5 years if you are 9-95 years of age.  Every year if you are 85 years old or older.  If you are between the ages of 29 and 29 and are a current or former smoker, ask your health care provider if you should have a one-time screening for abdominal aortic aneurysm (AAA).  Diabetes  Have regular diabetes screenings. This checks your fasting blood sugar level. Have the screening done:  Once every three years after age 23 if you are at a normal weight and have a low risk for diabetes.  More often and at a younger age if you are overweight or have a high risk for diabetes.  What should I know about preventing infection?  Hepatitis B  If you have a higher risk for hepatitis B, you should be screened for this virus. Talk with your health care provider to find out if you are at risk for hepatitis B infection.  Hepatitis C  Blood testing is recommended for:  Everyone born from [birth year] through 1965.  Anyone  with known risk factors for hepatitis C.  Sexually transmitted infections (STIs)  You should be screened each year for STIs, including gonorrhea and chlamydia, if:  You are sexually active and are younger than 62 years of age.  You are older than 63 years of age and your health care provider tells you that you are at risk for this type of infection.  Your sexual activity has changed since you were last screened, and you are at increased risk for chlamydia or gonorrhea. Ask your health care provider if you are at risk.  Ask your health care provider about whether you are at high risk for HIV. Your health care provider  may recommend a prescription medicine to help prevent HIV infection. If you choose to take medicine to prevent HIV, you should first get tested for HIV. You should then be tested every 3 months for as long as you are taking the medicine.  Follow these instructions at home:  Alcohol use  Do not drink alcohol if your health care provider tells you not to drink.  If you drink alcohol:  Limit how much you have to 0-2 drinks a day.  Know how much alcohol is in your drink. In the [place] one drink equals one 12 oz bottle of beer (355 mL), one 5 oz glass of wine (148 mL), or one 1 oz glass of hard liquor (44 mL).  Lifestyle  Do not use any products that contain nicotine or tobacco. These products include cigarettes, chewing tobacco, and vaping devices, such as e-cigarettes. If you need help quitting, ask your health care provider.  Do not use street drugs.  Do not share needles.  Ask your health care provider for help if you need support or information about quitting drugs.  General instructions  Schedule regular health, dental, and eye exams.  Stay current with your vaccines.  Tell your health care provider if:  You often feel depressed.  You have ever been abused or do not feel safe at home.  Summary  Adopting a healthy lifestyle and getting preventive care are important in promoting health and wellness.  Follow your health care provider's instructions about healthy diet, exercising, and getting tested or screened for diseases.  Follow your health care provider's instructions on monitoring your cholesterol and blood pressure.  This information is not intended to replace advice given to you by your health care provider. Make sure you discuss any questions you have with your health care provider.  Document Revised: 01/23/2021 Document Reviewed: 01/23/2021  Elsevier Patient Education  2024 [employer].

## 2023-11-12 NOTE — Assessment & Plan Note (Signed)
 Stable. Sees urologist on a regular basis.  PSA test done today

## 2023-11-12 NOTE — Progress Notes (Signed)
 Christopher [name] Colon 63 y.o.   Chief Complaint  Patient presents with   Follow-up    6 month f/u. Patient has no other concerns     HISTORY OF PRESENT ILLNESS: This is a 63 y.o. male here for 29-month follow-up of chronic medical conditions including dyslipidemia History of prostate cancer.  Requesting PSA. Was recently seen by cardiologist.  Normal lipid profile Stable on rosuvastatin 10 mg daily   HPI   Prior to Admission medications   Medication Sig Start Date End Date Taking? Authorizing Provider  rosuvastatin (CRESTOR) 10 MG tablet Take 1 tablet (10 mg total) by mouth daily. 07/03/23  Yes Pricilla Riffle, MD    Allergies  Allergen Reactions   Jonne Ply Arthritis Strength-Antacid [Aspirin Buffered] Swelling   Penicillins Hives and Other (See Comments)    Did it involve swelling of the face/tongue/throat, SOB, or low BP? No Did it involve sudden or severe rash/hives, skin peeling, or any reaction on the inside of your mouth or nose? No Did you need to seek medical attention at a hospital or doctor's office? No When did it last happen?   childhood    If all above answers are "NO", may proceed with cephalosporin use.  Did it involve swelling of the face/tongue/throat, SOB, or low BP? No Did it involve sudden or severe rash/hives, skin peeling, or any reaction on the inside of your mouth or nose? No Did you need to seek medical attention at a hospital or doctor's office? No When did it last happen?   childhood    If all above answers are "NO", may proceed with cephalosporin use.   Sulfa Antibiotics Hives    Patient Active Problem List   Diagnosis Date Noted   Family history of thyroid cancer 02/22/2022   History of prostate cancer 02/22/2022   Transient hypertension 02/21/2021   Prostate cancer (HCC) 12/28/2019   Malignant neoplasm of prostate (HCC) 11/17/2019   Family history of premature CAD 01/13/2016   Dyslipidemia 01/13/2016   Low testosterone in male 05/07/2014    Hearing impaired 12/17/2013    Past Medical History:  Diagnosis Date   Cancer (HCC)    [facility] 03/26/2020   GERD (gastroesophageal reflux disease) 10/31/2011   Hearing impaired 12/17/2013   lifelong    Hypogonadism male 10/31/2011   Prostate cancer (HCC)    PVC (premature ventricular contraction)     Past Surgical History:  Procedure Laterality Date   LYMPHADENECTOMY N/A 12/28/2019   Procedure: LYMPHADENECTOMY;  Surgeon: Heloise Purpura, MD;  Location: [facility];  Service: Urology;  Laterality: N/A;   PROSTATE BIOPSY     PROSTATE SURGERY N/A    [facility] 03/26/2020   ROBOT ASSISTED LAPAROSCOPIC RADICAL PROSTATECTOMY N/A 12/28/2019   Procedure: XI ROBOTIC ASSISTED LAPAROSCOPIC RADICAL PROSTATECTOMY LEVEL 2;  Surgeon: Heloise Purpura, MD;  Location: [facility];  Service: Urology;  Laterality: N/A;   TONSILLECTOMY      Social History   Socioeconomic History   Marital status: Married    Spouse name: Not on file   Number of children: 2   Years of education: Not on file   Highest education level: Master's degree (e.g., MA, MS, MEng, MEd, MSW, MBA)  Occupational History   Occupation: Probation officer: [employer]  Tobacco Use   Smoking status: Never   Smokeless tobacco: Never  Vaping Use   Vaping status: Never Used  Substance and Sexual Activity   Alcohol use: No   Drug use: No  Sexual activity: Yes  Other Topics Concern   Not on file  Social History Narrative   Married.  Education: Grade School.   Social Drivers of Health   Financial Resource Strain: Patient Declined (11/08/2023)   Overall Financial Resource Strain (CARDIA)    Difficulty of Paying Living Expenses: Patient declined  Food Insecurity: Patient Declined (11/08/2023)   Hunger Vital Sign    Worried About Running Out of Food in the Last Year: Patient declined    Ran Out of Food in the Last Year: Patient declined  Transportation Needs: No Transportation Needs (11/08/2023)   PRAPARE - Therapist, art (Medical): No    Lack of Transportation (Non-Medical): No  Physical Activity: Sufficiently Active (11/08/2023)   Exercise Vital Sign    Days of Exercise per Week: 5 days    Minutes of Exercise per Session: 40 min  Stress: Stress Concern Present (11/08/2023)   [employer] of Occupational Health - Occupational Stress Questionnaire    Feeling of Stress : To some extent  Social Connections: Unknown (11/08/2023)   Social Connection and Isolation Panel [NHANES]    Frequency of Communication with Friends and Family: Once a week    Frequency of Social Gatherings with Friends and Family: Patient declined    Attends Religious Services: Patient declined    Database administrator or Organizations: No    Attends Engineer, structural: Not on file    Marital Status: Patient declined  Catering manager Violence: Not on file    Family History  Problem Relation Age of Onset   Hyperlipidemia Father    Prostate cancer Father        treated with ADT and XRT   Heart disease Mother    Hyperlipidemia Mother    Thyroid cancer Sister    Breast cancer Neg Hx    Pancreatic cancer Neg Hx    Colon cancer Neg Hx      Review of Systems  Constitutional: Negative.  Negative for chills and fever.  HENT: Negative.  Negative for congestion and sore throat.   Respiratory: Negative.  Negative for cough and shortness of breath.   Cardiovascular: Negative.  Negative for chest pain and palpitations.  Gastrointestinal:  Negative for abdominal pain, diarrhea, nausea and vomiting.  Genitourinary: Negative.  Negative for dysuria and hematuria.  Skin: Negative.  Negative for rash.  Neurological: Negative.  Negative for dizziness and headaches.  All other systems reviewed and are negative.   Vitals:   11/12/23 0914  BP: 122/72  Pulse: 62  Temp: 98.1 F (36.7 C)  SpO2: 99%    Physical Exam Vitals reviewed.  Constitutional:      Appearance: Normal appearance.  HENT:      Head: Normocephalic.     Mouth/Throat:     Mouth: Mucous membranes are moist.     Pharynx: Oropharynx is clear.  Eyes:     Extraocular Movements: Extraocular movements intact.     Conjunctiva/sclera: Conjunctivae normal.     Pupils: Pupils are equal, round, and reactive to light.  Cardiovascular:     Rate and Rhythm: Normal rate and regular rhythm.     Pulses: Normal pulses.     Heart sounds: Normal heart sounds.  Pulmonary:     Effort: Pulmonary effort is normal.     Breath sounds: Normal breath sounds.  Musculoskeletal:     Cervical back: No tenderness.  Lymphadenopathy:     Cervical: No cervical adenopathy.  Skin:  General: Skin is warm and dry.  Neurological:     Mental Status: He is alert and oriented to person, place, and time.  Psychiatric:        Mood and Affect: Mood normal.        Behavior: Behavior normal.      ASSESSMENT & PLAN: A total of 42 minutes was spent with the patient and counseling/coordination of care regarding preparing for this visit, review of most recent office visit notes, review of multiple chronic medical conditions and their management, review of all medications, review of most recent bloodwork results, review of health maintenance items, education on nutrition, prognosis, documentation, and need for follow up.   Problem List Items Addressed This Visit       Genitourinary   Prostate cancer (HCC)   Stable. Sees urologist on a regular basis.  PSA test done today        Other   Dyslipidemia - Primary   Diet and nutrition discussed Exercises regularly.  Eating better Lost some weight on purpose Back on rosuvastatin 10 mg daily Most recent lipid profile done 10/01/2023 with cholesterol of 127 and triglycerides at 48       Relevant Orders   Comprehensive metabolic panel   CBC with Differential/Platelet   Hemoglobin A1c   History of prostate cancer   Relevant Orders   PSA   Patient Instructions  Health Maintenance, Male Adopting  a healthy lifestyle and getting preventive care are important in promoting health and wellness. Ask your health care provider about: The right schedule for you to have regular tests and exams. Things you can do on your own to prevent diseases and keep yourself healthy. What should I know about diet, weight, and exercise? Eat a healthy diet  Eat a diet that includes plenty of vegetables, fruits, low-fat dairy products, and lean protein. Do not eat a lot of foods that are high in solid fats, added sugars, or sodium. Maintain a healthy weight Body mass index (BMI) is a measurement that can be used to identify possible weight problems. It estimates body fat based on height and weight. Your health care provider can help determine your BMI and help you achieve or maintain a healthy weight. Get regular exercise Get regular exercise. This is one of the most important things you can do for your health. Most adults should: Exercise for at least 150 minutes each week. The exercise should increase your heart rate and make you sweat (moderate-intensity exercise). Do strengthening exercises at least twice a week. This is in addition to the moderate-intensity exercise. Spend less time sitting. Even light physical activity can be beneficial. Watch cholesterol and blood lipids Have your blood tested for lipids and cholesterol at 63 years of age, then have this test every 5 years. You may need to have your cholesterol levels checked more often if: Your lipid or cholesterol levels are high. You are older than 63 years of age. You are at high risk for heart disease. What should I know about cancer screening? Many types of cancers can be detected early and may often be prevented. Depending on your health history and family history, you may need to have cancer screening at various ages. This may include screening for: Colorectal cancer. Prostate cancer. Skin cancer. Lung cancer. What should I know about heart  disease, diabetes, and high blood pressure? Blood pressure and heart disease High blood pressure causes heart disease and increases the risk of stroke. This is more likely to develop  in people who have high blood pressure readings or are overweight. Talk with your health care provider about your target blood pressure readings. Have your blood pressure checked: Every 3-5 years if you are 34-61 years of age. Every year if you are 62 years old or older. If you are between the ages of 31 and 73 and are a current or former smoker, ask your health care provider if you should have a one-time screening for abdominal aortic aneurysm (AAA). Diabetes Have regular diabetes screenings. This checks your fasting blood sugar level. Have the screening done: Once every three years after age 55 if you are at a normal weight and have a low risk for diabetes. More often and at a younger age if you are overweight or have a high risk for diabetes. What should I know about preventing infection? Hepatitis B If you have a higher risk for hepatitis B, you should be screened for this virus. Talk with your health care provider to find out if you are at risk for hepatitis B infection. Hepatitis C Blood testing is recommended for: Everyone born from [birth year] through 1965. Anyone with known risk factors for hepatitis C. Sexually transmitted infections (STIs) You should be screened each year for STIs, including gonorrhea and chlamydia, if: You are sexually active and are younger than 63 years of age. You are older than 63 years of age and your health care provider tells you that you are at risk for this type of infection. Your sexual activity has changed since you were last screened, and you are at increased risk for chlamydia or gonorrhea. Ask your health care provider if you are at risk. Ask your health care provider about whether you are at high risk for HIV. Your health care provider may recommend a prescription medicine to  help prevent HIV infection. If you choose to take medicine to prevent HIV, you should first get tested for HIV. You should then be tested every 3 months for as long as you are taking the medicine. Follow these instructions at home: Alcohol use Do not drink alcohol if your health care provider tells you not to drink. If you drink alcohol: Limit how much you have to 0-2 drinks a day. Know how much alcohol is in your drink. In the [place] one drink equals one 12 oz bottle of beer (355 mL), one 5 oz glass of wine (148 mL), or one 1 oz glass of hard liquor (44 mL). Lifestyle Do not use any products that contain nicotine or tobacco. These products include cigarettes, chewing tobacco, and vaping devices, such as e-cigarettes. If you need help quitting, ask your health care provider. Do not use street drugs. Do not share needles. Ask your health care provider for help if you need support or information about quitting drugs. General instructions Schedule regular health, dental, and eye exams. Stay current with your vaccines. Tell your health care provider if: You often feel depressed. You have ever been abused or do not feel safe at home. Summary Adopting a healthy lifestyle and getting preventive care are important in promoting health and wellness. Follow your health care provider's instructions about healthy diet, exercising, and getting tested or screened for diseases. Follow your health care provider's instructions on monitoring your cholesterol and blood pressure. This information is not intended to replace advice given to you by your health care provider. Make sure you discuss any questions you have with your health care provider. Document Revised: 01/23/2021 Document Reviewed: 01/23/2021 Elsevier Patient Education  2024 Elsevier Inc.    Edwina Barth, MD [facility]

## 2023-11-12 NOTE — Assessment & Plan Note (Signed)
 Diet and nutrition discussed Exercises regularly.  Eating better Lost some weight on purpose Back on rosuvastatin 10 mg daily Most recent lipid profile done 10/01/2023 with cholesterol of 127 and triglycerides at 48

## 2023-11-25 ENCOUNTER — Ambulatory Visit: Payer: 59 | Admitting: Emergency Medicine

## 2023-12-02 ENCOUNTER — Other Ambulatory Visit: Payer: Self-pay

## 2023-12-02 MED ORDER — ROSUVASTATIN CALCIUM 10 MG PO TABS: 10.0000 mg | ORAL_TABLET | Freq: Every day | ORAL | 1 refills | Status: DC

## 2024-04-21 ENCOUNTER — Encounter: Payer: Self-pay | Admitting: Emergency Medicine

## 2024-04-21 ENCOUNTER — Telehealth: Payer: Self-pay | Admitting: Emergency Medicine

## 2024-04-21 ENCOUNTER — Ambulatory Visit: Payer: Self-pay | Admitting: Emergency Medicine

## 2024-04-21 ENCOUNTER — Ambulatory Visit: Admitting: Emergency Medicine

## 2024-04-21 VITALS — BP 128/78 | HR 65 | Temp 98.3°F | Ht 70.0 in | Wt 223.0 lb

## 2024-04-21 DIAGNOSIS — R7989 Other specified abnormal findings of blood chemistry: Secondary | ICD-10-CM

## 2024-04-21 DIAGNOSIS — C61 Malignant neoplasm of prostate: Secondary | ICD-10-CM | POA: Diagnosis not present

## 2024-04-21 DIAGNOSIS — R030 Elevated blood-pressure reading, without diagnosis of hypertension: Secondary | ICD-10-CM

## 2024-04-21 DIAGNOSIS — E785 Hyperlipidemia, unspecified: Secondary | ICD-10-CM

## 2024-04-21 LAB — CBC WITH DIFFERENTIAL/PLATELET
Basophils Absolute: 0.1 K/uL (ref 0.0–0.1)
Basophils Relative: 1.2 % (ref 0.0–3.0)
Eosinophils Absolute: 0.1 K/uL (ref 0.0–0.7)
Eosinophils Relative: 2.5 % (ref 0.0–5.0)
HCT: 41.2 % (ref 39.0–52.0)
Hemoglobin: 14 g/dL (ref 13.0–17.0)
Lymphocytes Relative: 32.6 % (ref 12.0–46.0)
Lymphs Abs: 1.7 K/uL (ref 0.7–4.0)
MCHC: 33.9 g/dL (ref 30.0–36.0)
MCV: 84.6 fl (ref 78.0–100.0)
Monocytes Absolute: 0.6 K/uL (ref 0.1–1.0)
Monocytes Relative: 11.4 % (ref 3.0–12.0)
Neutro Abs: 2.7 K/uL (ref 1.4–7.7)
Neutrophils Relative %: 52.3 % (ref 43.0–77.0)
Platelets: 245 K/uL (ref 150.0–400.0)
RBC: 4.86 Mil/uL (ref 4.22–5.81)
RDW: 12.9 % (ref 11.5–15.5)
WBC: 5.1 K/uL (ref 4.0–10.5)

## 2024-04-21 LAB — COMPREHENSIVE METABOLIC PANEL WITH GFR
ALT: 30 U/L (ref 0–53)
AST: 23 U/L (ref 0–37)
Albumin: 4.1 g/dL (ref 3.5–5.2)
Alkaline Phosphatase: 59 U/L (ref 39–117)
BUN: 18 mg/dL (ref 6–23)
CO2: 27 meq/L (ref 19–32)
Calcium: 9 mg/dL (ref 8.4–10.5)
Chloride: 105 meq/L (ref 96–112)
Creatinine, Ser: 0.94 mg/dL (ref 0.40–1.50)
GFR: 86.74 mL/min (ref >60.00)
Glucose, Bld: 98 mg/dL (ref 70–99)
Potassium: 4.1 meq/L (ref 3.5–5.1)
Sodium: 138 meq/L (ref 135–145)
Total Bilirubin: 1.1 mg/dL (ref 0.2–1.2)
Total Protein: 7.1 g/dL (ref 6.0–8.3)

## 2024-04-21 LAB — LIPID PANEL
Cholesterol: 156 mg/dL (ref 0–200)
HDL: 51.9 mg/dL (ref >39.00)
LDL Cholesterol: 93 mg/dL (ref 0–99)
NonHDL: 103.8
Total CHOL/HDL Ratio: 3
Triglycerides: 55 mg/dL (ref 0.0–149.0)
VLDL: 11 mg/dL (ref 0.0–40.0)

## 2024-04-21 LAB — PSA: PSA: 0.22 ng/mL (ref 0.10–4.00)

## 2024-04-21 LAB — TESTOSTERONE: Testosterone: 306.18 ng/dL (ref 300.00–890.00)

## 2024-04-21 NOTE — Assessment & Plan Note (Signed)
Stable. Will check testosterone levels today.

## 2024-04-21 NOTE — Assessment & Plan Note (Signed)
 BP Readings from Last 3 Encounters:  04/21/24 128/78  11/12/23 122/72  05/27/23 118/72  Normotensive, off medications.

## 2024-04-21 NOTE — Assessment & Plan Note (Signed)
 Concerned about PSA trending up Stable. Sees urologist on a regular basis.  PSA test done today

## 2024-04-21 NOTE — Progress Notes (Signed)
 Christopher [name] Colon 63 y.o.   Chief Complaint  Patient presents with   Follow-up    Patient here for 6 month f/u for HTN no other concerns     HISTORY OF PRESENT ILLNESS: This is a 63 y.o. male here for 31-month follow-up of chronic medical conditions History of prostate cancer.  Concerned about PSA trending up.  Sees urologist on a regular basis History of hypertension, diet controlled.  No medications. History of dyslipidemia on medication. No other complaints or medical concerns today.  HPI   Prior to Admission medications   Medication Sig Start Date End Date Taking? Authorizing Provider  rosuvastatin  (CRESTOR ) 10 MG tablet Take 1 tablet (10 mg total) by mouth daily. 12/02/23  Yes Okey Vina GAILS, MD    Allergies  Allergen Reactions   Dorethia Arthritis Strength-Antacid [Aspirin Buffered] Swelling   Penicillins Hives and Other (See Comments)    Did it involve swelling of the face/tongue/throat, SOB, or low BP? No Did it involve sudden or severe rash/hives, skin peeling, or any reaction on the inside of your mouth or nose? No Did you need to seek medical attention at a hospital or doctor's office? No When did it last happen?   childhood    If all above answers are "NO", may proceed with cephalosporin use.  Did it involve swelling of the face/tongue/throat, SOB, or low BP? No Did it involve sudden or severe rash/hives, skin peeling, or any reaction on the inside of your mouth or nose? No Did you need to seek medical attention at a hospital or doctor's office? No When did it last happen?   childhood    If all above answers are "NO", may proceed with cephalosporin use.   Sulfa Antibiotics Hives    Patient Active Problem List   Diagnosis Date Noted   Family history of thyroid  cancer 02/22/2022   History of prostate cancer 02/22/2022   Transient hypertension 02/21/2021   Prostate cancer (HCC) 12/28/2019   Malignant neoplasm of prostate (HCC) 11/17/2019   Family history of  premature CAD 01/13/2016   Dyslipidemia 01/13/2016   Low testosterone  in male 05/07/2014   Hearing impaired 12/17/2013    Past Medical History:  Diagnosis Date   Cancer (HCC)    Phreesia 03/26/2020   GERD (gastroesophageal reflux disease) 10/31/2011   Hearing impaired 12/17/2013   lifelong    Hypogonadism male 10/31/2011   Prostate cancer (HCC)    PVC (premature ventricular contraction)     Past Surgical History:  Procedure Laterality Date   LYMPHADENECTOMY N/A 12/28/2019   Procedure: LYMPHADENECTOMY;  Surgeon: Renda Glance, MD;  Location: [facility];  Service: Urology;  Laterality: N/A;   PROSTATE BIOPSY     PROSTATE SURGERY N/A    Phreesia 03/26/2020   ROBOT ASSISTED LAPAROSCOPIC RADICAL PROSTATECTOMY N/A 12/28/2019   Procedure: XI ROBOTIC ASSISTED LAPAROSCOPIC RADICAL PROSTATECTOMY LEVEL 2;  Surgeon: Renda Glance, MD;  Location: [facility];  Service: Urology;  Laterality: N/A;   TONSILLECTOMY      Social History   Socioeconomic History   Marital status: Married    Spouse name: Not on file   Number of children: 2   Years of education: Not on file   Highest education level: Master's degree (e.g., MA, MS, MEng, MEd, MSW, MBA)  Occupational History   Occupation: Probation officer: [employer]  Tobacco Use   Smoking status: Never   Smokeless tobacco: Never  Vaping Use   Vaping status: Never Used  Substance and Sexual Activity   Alcohol use: No   Drug use: No   Sexual activity: Yes  Other Topics Concern   Not on file  Social History Narrative   Married.  Education: Grade School.   Social Drivers of Health   Financial Resource Strain: Patient Declined (04/20/2024)   Overall Financial Resource Strain (CARDIA)    Difficulty of Paying Living Expenses: Patient declined  Food Insecurity: Patient Declined (04/20/2024)   Hunger Vital Sign    Worried About Running Out of Food in the Last Year: Patient declined    Ran Out of Food in the Last Year: Patient  declined  Transportation Needs: No Transportation Needs (04/20/2024)   PRAPARE - Administrator, Civil Service (Medical): No    Lack of Transportation (Non-Medical): No  Physical Activity: Insufficiently Active (04/20/2024)   Exercise Vital Sign    Days of Exercise per Week: 4 days    Minutes of Exercise per Session: 30 min  Stress: No Stress Concern Present (04/20/2024)   [employer] of Occupational Health - Occupational Stress Questionnaire    Feeling of Stress: Not at all  Social Connections: Unknown (04/20/2024)   Social Connection and Isolation Panel    Frequency of Communication with Friends and Family: Patient declined    Frequency of Social Gatherings with Friends and Family: Patient declined    Attends Religious Services: Patient declined    Database administrator or Organizations: Patient declined    Attends Engineer, structural: Not on file    Marital Status: Patient declined  Catering manager Violence: Not on file    Family History  Problem Relation Age of Onset   Hyperlipidemia Father    Prostate cancer Father        treated with ADT and XRT   Heart disease Mother    Hyperlipidemia Mother    Thyroid  cancer Sister    Breast cancer Neg Hx    Pancreatic cancer Neg Hx    Colon cancer Neg Hx      Review of Systems  Constitutional: Negative.  Negative for chills and fever.  HENT: Negative.  Negative for congestion and sore throat.   Respiratory: Negative.  Negative for cough and shortness of breath.   Cardiovascular: Negative.  Negative for chest pain and palpitations.  Gastrointestinal:  Negative for abdominal pain, diarrhea, nausea and vomiting.  Genitourinary: Negative.  Negative for dysuria and hematuria.  Skin: Negative.  Negative for rash.  Neurological: Negative.  Negative for dizziness and headaches.  All other systems reviewed and are negative.   Vitals:   04/21/24 0945  BP: 128/78  Pulse: 65  Temp: 98.3 F (36.8 C)  SpO2: 97%     Physical Exam Vitals reviewed.  Constitutional:      Appearance: Normal appearance.  HENT:     Head: Normocephalic.  Eyes:     Extraocular Movements: Extraocular movements intact.     Pupils: Pupils are equal, round, and reactive to light.  Cardiovascular:     Rate and Rhythm: Normal rate and regular rhythm.     Pulses: Normal pulses.     Heart sounds: Normal heart sounds.  Pulmonary:     Effort: Pulmonary effort is normal.     Breath sounds: Normal breath sounds.  Musculoskeletal:     Cervical back: No tenderness.  Lymphadenopathy:     Cervical: No cervical adenopathy.  Skin:    General: Skin is warm and dry.  Neurological:  Mental Status: He is alert and oriented to person, place, and time.  Psychiatric:        Mood and Affect: Mood normal.        Behavior: Behavior normal.      ASSESSMENT & PLAN: A total of 42 minutes was spent with the patient and counseling/coordination of care regarding preparing for this visit, review of most recent office visit notes, review of multiple chronic medical conditions and their management, review of all medications, review of most recent bloodwork results, review of health maintenance items, education on nutrition, prognosis, documentation, and need for follow up.   Problem List Items Addressed This Visit       Cardiovascular and Mediastinum   Transient hypertension   BP Readings from Last 3 Encounters:  04/21/24 128/78  11/12/23 122/72  05/27/23 118/72  Normotensive, off medications.       Relevant Orders   Comprehensive metabolic panel with GFR     Genitourinary   Prostate cancer (HCC) - Primary   Concerned about PSA trending up Stable. Sees urologist on a regular basis.  PSA test done today      Relevant Orders   PSA   CBC with Differential/Platelet     Other   Low testosterone  in male   Stable. Will check testosterone  levels today.      Relevant Orders   Testosterone    Dyslipidemia   Diet and  nutrition discussed Exercises regularly.  Eating better Lost some weight on purpose Back on rosuvastatin  10 mg daily Most recent lipid profile done 10/01/2023 with cholesterol of 127 and triglycerides at 48      Relevant Orders   Comprehensive metabolic panel with GFR   Lipid panel   Patient Instructions  Health Maintenance, Male Adopting a healthy lifestyle and getting preventive care are important in promoting health and wellness. Ask your health care provider about: The right schedule for you to have regular tests and exams. Things you can do on your own to prevent diseases and keep yourself healthy. What should I know about diet, weight, and exercise? Eat a healthy diet  Eat a diet that includes plenty of vegetables, fruits, low-fat dairy products, and lean protein. Do not eat a lot of foods that are high in solid fats, added sugars, or sodium. Maintain a healthy weight Body mass index (BMI) is a measurement that can be used to identify possible weight problems. It estimates body fat based on height and weight. Your health care provider can help determine your BMI and help you achieve or maintain a healthy weight. Get regular exercise Get regular exercise. This is one of the most important things you can do for your health. Most adults should: Exercise for at least 150 minutes each week. The exercise should increase your heart rate and make you sweat (moderate-intensity exercise). Do strengthening exercises at least twice a week. This is in addition to the moderate-intensity exercise. Spend less time sitting. Even light physical activity can be beneficial. Watch cholesterol and blood lipids Have your blood tested for lipids and cholesterol at 63 years of age, then have this test every 5 years. You may need to have your cholesterol levels checked more often if: Your lipid or cholesterol levels are high. You are older than 63 years of age. You are at high risk for heart  disease. What should I know about cancer screening? Many types of cancers can be detected early and may often be prevented. Depending on your health history and family  history, you may need to have cancer screening at various ages. This may include screening for: Colorectal cancer. Prostate cancer. Skin cancer. Lung cancer. What should I know about heart disease, diabetes, and high blood pressure? Blood pressure and heart disease High blood pressure causes heart disease and increases the risk of stroke. This is more likely to develop in people who have high blood pressure readings or are overweight. Talk with your health care provider about your target blood pressure readings. Have your blood pressure checked: Every 3-5 years if you are 4-35 years of age. Every year if you are 86 years old or older. If you are between the ages of 75 and 15 and are a current or former smoker, ask your health care provider if you should have a one-time screening for abdominal aortic aneurysm (AAA). Diabetes Have regular diabetes screenings. This checks your fasting blood sugar level. Have the screening done: Once every three years after age 61 if you are at a normal weight and have a low risk for diabetes. More often and at a younger age if you are overweight or have a high risk for diabetes. What should I know about preventing infection? Hepatitis B If you have a higher risk for hepatitis B, you should be screened for this virus. Talk with your health care provider to find out if you are at risk for hepatitis B infection. Hepatitis C Blood testing is recommended for: Everyone born from [birth year] through 1965. Anyone with known risk factors for hepatitis C. Sexually transmitted infections (STIs) You should be screened each year for STIs, including gonorrhea and chlamydia, if: You are sexually active and are younger than 63 years of age. You are older than 63 years of age and your health care provider tells you  that you are at risk for this type of infection. Your sexual activity has changed since you were last screened, and you are at increased risk for chlamydia or gonorrhea. Ask your health care provider if you are at risk. Ask your health care provider about whether you are at high risk for HIV. Your health care provider may recommend a prescription medicine to help prevent HIV infection. If you choose to take medicine to prevent HIV, you should first get tested for HIV. You should then be tested every 3 months for as long as you are taking the medicine. Follow these instructions at home: Alcohol use Do not drink alcohol if your health care provider tells you not to drink. If you drink alcohol: Limit how much you have to 0-2 drinks a day. Know how much alcohol is in your drink. In the [place] one drink equals one 12 oz bottle of beer (355 mL), one 5 oz glass of wine (148 mL), or one 1 oz glass of hard liquor (44 mL). Lifestyle Do not use any products that contain nicotine or tobacco. These products include cigarettes, chewing tobacco, and vaping devices, such as e-cigarettes. If you need help quitting, ask your health care provider. Do not use street drugs. Do not share needles. Ask your health care provider for help if you need support or information about quitting drugs. General instructions Schedule regular health, dental, and eye exams. Stay current with your vaccines. Tell your health care provider if: You often feel depressed. You have ever been abused or do not feel safe at home. Summary Adopting a healthy lifestyle and getting preventive care are important in promoting health and wellness. Follow your health care provider's instructions about healthy diet,  exercising, and getting tested or screened for diseases. Follow your health care provider's instructions on monitoring your cholesterol and blood pressure. This information is not intended to replace advice given to you by your health  care provider. Make sure you discuss any questions you have with your health care provider. Document Revised: 01/23/2021 Document Reviewed: 01/23/2021 Elsevier Patient Education  2024 Elsevier Inc.     Emil Schaumann, MD [facility]

## 2024-04-21 NOTE — Assessment & Plan Note (Signed)
 Diet and nutrition discussed Exercises regularly.  Eating better Lost some weight on purpose Back on rosuvastatin 10 mg daily Most recent lipid profile done 10/01/2023 with cholesterol of 127 and triglycerides at 48

## 2024-04-21 NOTE — Patient Instructions (Signed)
 Health Maintenance, Male  Adopting a healthy lifestyle and getting preventive care are important in promoting health and wellness. Ask your health care provider about:  The right schedule for you to have regular tests and exams.  Things you can do on your own to prevent diseases and keep yourself healthy.  What should I know about diet, weight, and exercise?  Eat a healthy diet    Eat a diet that includes plenty of vegetables, fruits, low-fat dairy products, and lean protein.  Do not eat a lot of foods that are high in solid fats, added sugars, or sodium.  Maintain a healthy weight  Body mass index (BMI) is a measurement that can be used to identify possible weight problems. It estimates body fat based on height and weight. Your health care provider can help determine your BMI and help you achieve or maintain a healthy weight.  Get regular exercise  Get regular exercise. This is one of the most important things you can do for your health. Most adults should:  Exercise for at least 150 minutes each week. The exercise should increase your heart rate and make you sweat (moderate-intensity exercise).  Do strengthening exercises at least twice a week. This is in addition to the moderate-intensity exercise.  Spend less time sitting. Even light physical activity can be beneficial.  Watch cholesterol and blood lipids  Have your blood tested for lipids and cholesterol at 63 years of age, then have this test every 5 years.  You may need to have your cholesterol levels checked more often if:  Your lipid or cholesterol levels are high.  You are older than 63 years of age.  You are at high risk for heart disease.  What should I know about cancer screening?  Many types of cancers can be detected early and may often be prevented. Depending on your health history and family history, you may need to have cancer screening at various ages. This may include screening for:  Colorectal cancer.  Prostate cancer.  Skin cancer.  Lung  cancer.  What should I know about heart disease, diabetes, and high blood pressure?  Blood pressure and heart disease  High blood pressure causes heart disease and increases the risk of stroke. This is more likely to develop in people who have high blood pressure readings or are overweight.  Talk with your health care provider about your target blood pressure readings.  Have your blood pressure checked:  Every 3-5 years if you are 9-95 years of age.  Every year if you are 85 years old or older.  If you are between the ages of 29 and 29 and are a current or former smoker, ask your health care provider if you should have a one-time screening for abdominal aortic aneurysm (AAA).  Diabetes  Have regular diabetes screenings. This checks your fasting blood sugar level. Have the screening done:  Once every three years after age 23 if you are at a normal weight and have a low risk for diabetes.  More often and at a younger age if you are overweight or have a high risk for diabetes.  What should I know about preventing infection?  Hepatitis B  If you have a higher risk for hepatitis B, you should be screened for this virus. Talk with your health care provider to find out if you are at risk for hepatitis B infection.  Hepatitis C  Blood testing is recommended for:  Everyone born from [birth year] through 1965.  Anyone  with known risk factors for hepatitis C.  Sexually transmitted infections (STIs)  You should be screened each year for STIs, including gonorrhea and chlamydia, if:  You are sexually active and are younger than 62 years of age.  You are older than 63 years of age and your health care provider tells you that you are at risk for this type of infection.  Your sexual activity has changed since you were last screened, and you are at increased risk for chlamydia or gonorrhea. Ask your health care provider if you are at risk.  Ask your health care provider about whether you are at high risk for HIV. Your health care provider  may recommend a prescription medicine to help prevent HIV infection. If you choose to take medicine to prevent HIV, you should first get tested for HIV. You should then be tested every 3 months for as long as you are taking the medicine.  Follow these instructions at home:  Alcohol use  Do not drink alcohol if your health care provider tells you not to drink.  If you drink alcohol:  Limit how much you have to 0-2 drinks a day.  Know how much alcohol is in your drink. In the [place] one drink equals one 12 oz bottle of beer (355 mL), one 5 oz glass of wine (148 mL), or one 1 oz glass of hard liquor (44 mL).  Lifestyle  Do not use any products that contain nicotine or tobacco. These products include cigarettes, chewing tobacco, and vaping devices, such as e-cigarettes. If you need help quitting, ask your health care provider.  Do not use street drugs.  Do not share needles.  Ask your health care provider for help if you need support or information about quitting drugs.  General instructions  Schedule regular health, dental, and eye exams.  Stay current with your vaccines.  Tell your health care provider if:  You often feel depressed.  You have ever been abused or do not feel safe at home.  Summary  Adopting a healthy lifestyle and getting preventive care are important in promoting health and wellness.  Follow your health care provider's instructions about healthy diet, exercising, and getting tested or screened for diseases.  Follow your health care provider's instructions on monitoring your cholesterol and blood pressure.  This information is not intended to replace advice given to you by your health care provider. Make sure you discuss any questions you have with your health care provider.  Document Revised: 01/23/2021 Document Reviewed: 01/23/2021  Elsevier Patient Education  2024 [employer].

## 2024-05-11 ENCOUNTER — Ambulatory Visit: Payer: 59 | Admitting: Emergency Medicine

## 2024-06-16 ENCOUNTER — Other Ambulatory Visit (HOSPITAL_COMMUNITY): Payer: Self-pay | Admitting: Urology

## 2024-06-16 DIAGNOSIS — C61 Malignant neoplasm of prostate: Secondary | ICD-10-CM

## 2024-06-18 ENCOUNTER — Ambulatory Visit: Admitting: Internal Medicine

## 2024-06-18 ENCOUNTER — Encounter (HOSPITAL_COMMUNITY)
Admission: RE | Admit: 2024-06-18 | Discharge: 2024-06-18 | Disposition: A | Discharge status: Home / Self Care | Source: Ambulatory Visit | Attending: Urology | Admitting: Urology

## 2024-06-18 DIAGNOSIS — C61 Malignant neoplasm of prostate: Secondary | ICD-10-CM | POA: Insufficient documentation

## 2024-06-18 MED ORDER — FLOTUFOLASTAT F 18 GALLIUM 296-5846 MBQ/ML IV SOLN
8.8000 | Freq: Once | INTRAVENOUS | Status: AC
  Administered 2024-06-18: 8.8 via INTRAVENOUS

## 2024-07-03 NOTE — Progress Notes (Signed)
 I called pt to introduce myself as  the Coordinator of the [facility]state [facility].   1. I confirmed with the patient he is aware of his referral to the clinic on 11/14, arriving @ 8:00 am.    2. I discussed the format of the clinic and the physicians he will be seeing that day.   3. I discussed where the clinic is located and how to contact me.   4. I confirmed his address and informed him I would be mailing a packet of information and forms to be completed. I asked him to bring them with him the day of his appointment.    He voiced understanding of the above. I asked him to call me if he has any questions or concerns regarding his appointments or the forms he needs to complete.

## 2024-07-23 ENCOUNTER — Other Ambulatory Visit: Payer: Self-pay | Admitting: Internal Medicine

## 2024-07-24 MED ORDER — ROSUVASTATIN CALCIUM 10 MG PO TABS: 10.0000 mg | ORAL_TABLET | Freq: Every day | ORAL | 0 refills | Status: DC

## 2024-07-30 NOTE — Progress Notes (Signed)
 Spoke with patient to review upcoming [facility] appointment.  Patient received packet and will bring completed information tomorrow, 11/14.

## 2024-07-30 NOTE — Progress Notes (Unsigned)
                               Care Plan Summary  Name: Christopher [name]  DOB: 10-31-1960   Your Medical Team:   Urologist -  Dr. Gretel Ferrara, [facility]  Radiation Oncologist - Dr. Donnice Barge, [facility]   Medical Oncologist - Dr. Pauletta Chihuahua, [facility]  Recommendations: ST-ADT Salvage Radiation (7.5 weeks)  * These recommendations are based on information available as of today's consult.      Recommendations may change depending on the results of further tests or exams.    Next Steps: 1) Consider all your options and contact Vertell, your nurse navigator, with any questions or treatment decision.    When appointments need to be scheduled, you will be contacted by [facility] and/or [facility].  Questions?  Please do not hesitate to call Vertell Pont, BSN, RN at [phone] with any questions or concerns.  Vertell is your Oncology Nurse Navigator and is available to assist you while you're receiving your medical care at [facility].

## 2024-07-30 NOTE — Progress Notes (Unsigned)
 [facility] CONSULT NOTE  Patient Care Team: Purcell Emil Schanz, MD as PCP - General (Internal Medicine) Hobart Powell BRAVO, MD (Inactive) as PCP - Cardiology (Cardiology) Vertell Pont, RN as Oncology Nurse Navigator  ASSESSMENT & PLAN:  Christopher [name] is a 63 y.o.male with history of HLD, hearing loss being seen at [facility] for prostate cancer.  Patient presenting with BCR post prostatectomy.  PET from 06/2024 was without recurrent or tracer avid metastatic disease.  Current diagnosis: rising PSA of 0.2 s/p RALP 12/2019 for Stage pT2N0, Gleason 4+3 prostate cancer  Initial diagnosis: 10/2019 Gleason 4+3 in 3 cores PSA of 3.24.  Radical prostatectomy show GS 4+3=7 GG3. pT2 N0 cM0. Margins negative. Germline testing: Somatic testing: Treatment:  His case was discussed at tumor board with multiple specialists including radiation oncologist, urology oncologist, pathologist, radiologist. The patient was counseled on the natural history of prostate cancer and the standard treatment options that are available for prostate cancer.   Treatment is recommended with salvage RT with ADT. Patient was encouraged to consider this.  Patient will follow-up with radiation oncology or urologic oncology for definitive treatment.  He may follow-up with medical oncology as needed.  Assessment & Plan   No orders of the defined types were placed in this encounter.   Supportive baseline bone mineral density study and then every 2 years calcium  (1000-1200 mg daily from food and supplements) and vitamin D3 (1000 IU daily) Zometa (5 mg IV annually) for osteopenia (T-score between -1.0 and -2.5) on ADT after dental clearance. If CRPC, 4 mg every 3 months if having bone metastases. Healthy lifestyle to prevent diabetes and CV disease Weight-bearing exercises (30 minutes per day) Limit alcohol consumption and avoid smoking  All questions were answered. The patient knows to call the clinic with  any problems, questions or concerns. No barriers to learning was detected.  [name], MD 11/13/20255:16 PM  CHIEF COMPLAINTS/PURPOSE OF CONSULTATION:  prostate cancer  HISTORY OF PRESENTING ILLNESS:  Christopher [name] 63 y.o. male is here because of prostate cancer.  I have reviewed his chart and materials related to his cancer extensively and collaborated history with the patient. Summary of oncologic history is as follows:  He initially presented with 4+3 equal 7 GG 3 prostate cancer s/p prostatectomy in 2021.  His postoperative PSA was barely detectable at 0.018.  PSA slowly rise over the past few years.  PSA was 0.22 in August 2025.  He denies any urinary symptoms such as dysuria, increased frequency, hesitancy, hematuria or difficulty urinating.  Oncology History  Malignant neoplasm of prostate (HCC)  10/19/2019 Cancer Staging   Staging form: Prostate, AJCC 8th Edition - Clinical stage from 10/19/2019: Stage IIC (cT1c, cN0, cM0, PSA: 3.2, Grade Group: 3) - Signed by Sherwood Rise, PA-C on 11/17/2019   11/17/2019 Initial Diagnosis   Malignant neoplasm of prostate [facility])     MEDICAL HISTORY:  Past Medical History:  Diagnosis Date   Cancer (HCC)    Phreesia 03/26/2020   GERD (gastroesophageal reflux disease) 10/31/2011   Hearing impaired 12/17/2013   lifelong    Hypogonadism male 10/31/2011   Prostate cancer (HCC)    PVC (premature ventricular contraction)     SURGICAL HISTORY: Past Surgical History:  Procedure Laterality Date   LYMPHADENECTOMY N/A 12/28/2019   Procedure: LYMPHADENECTOMY;  Surgeon: Renda Glance, MD;  Location: [facility];  Service: Urology;  Laterality: N/A;   PROSTATE BIOPSY     PROSTATE SURGERY N/A  Phreesia 03/26/2020   ROBOT ASSISTED LAPAROSCOPIC RADICAL PROSTATECTOMY N/A 12/28/2019   Procedure: XI ROBOTIC ASSISTED LAPAROSCOPIC RADICAL PROSTATECTOMY LEVEL 2;  Surgeon: Renda Glance, MD;  Location: [facility];  Service: Urology;  Laterality: N/A;    TONSILLECTOMY      SOCIAL HISTORY: Social History   Socioeconomic History   Marital status: Married    Spouse name: Not on file   Number of children: 2   Years of education: Not on file   Highest education level: Master's degree (e.g., MA, MS, MEng, MEd, MSW, MBA)  Occupational History   Occupation: Probation Officer: [employer]  Tobacco Use   Smoking status: Never   Smokeless tobacco: Never  Vaping Use   Vaping status: Never Used  Substance and Sexual Activity   Alcohol use: No   Drug use: No   Sexual activity: Yes  Other Topics Concern   Not on file  Social History Narrative   Married.  Education: Grade School.   Social Drivers of Health   Financial Resource Strain: Patient Declined (04/20/2024)   Overall Financial Resource Strain (CARDIA)    Difficulty of Paying Living Expenses: Patient declined  Food Insecurity: No Food Insecurity (07/28/2024)   Hunger Vital Sign    Worried About Running Out of Food in the Last Year: Never true    Ran Out of Food in the Last Year: Never true  Transportation Needs: No Transportation Needs (07/28/2024)   PRAPARE - Administrator, Civil Service (Medical): No    Lack of Transportation (Non-Medical): No  Physical Activity: Insufficiently Active (04/20/2024)   Exercise Vital Sign    Days of Exercise per Week: 4 days    Minutes of Exercise per Session: 30 min  Stress: No Stress Concern Present (04/20/2024)   [employer] of Occupational Health - Occupational Stress Questionnaire    Feeling of Stress: Not at all  Social Connections: Unknown (04/20/2024)   Social Connection and Isolation Panel    Frequency of Communication with Friends and Family: Patient declined    Frequency of Social Gatherings with Friends and Family: Patient declined    Attends Religious Services: Patient declined    Database Administrator or Organizations: Patient declined    Attends Engineer, Structural: Not on file     Marital Status: Patient declined  Catering Manager Violence: Not on file    FAMILY HISTORY: Family History  Problem Relation Age of Onset   Hyperlipidemia Father    Prostate cancer Father        treated with ADT and XRT   Heart disease Mother    Hyperlipidemia Mother    Thyroid  cancer Sister    Breast cancer Neg Hx    Pancreatic cancer Neg Hx    [name] cancer Neg Hx     ALLERGIES:  is allergic to asa arthritis strength-antacid [aspirin buffered], penicillins, and sulfa antibiotics.  MEDICATIONS:  Current Outpatient Medications  Medication Sig Dispense Refill   rosuvastatin  (CRESTOR ) 10 MG tablet Take 1 tablet (10 mg total) by mouth daily. 30 tablet 0   No current facility-administered medications for this visit.    REVIEW OF SYSTEMS:   All relevant systems were reviewed with the patient and are negative.  PHYSICAL EXAMINATION: ECOG PERFORMANCE STATUS: {CHL ONC ECOG [phone]}  There were no vitals filed for this visit. There were no vitals filed for this visit.  GENERAL: alert, no distress and comfortable SKIN: skin color is normal, no jaundice LUNGS:  Effort normal, no respiratory distress.  Clear to auscultation bilaterally HEART: regular rate & rhythm and no lower extremity edema ABDOMEN: soft, non-tender and nondistended Musculoskeletal: no point tenderness  LABORATORY & PATHOLOGY DATA:  I have reviewed the results of labs, PSA and biopsy results related to his cancer.  RADIOGRAPHIC STUDIES: I have reviewed the radiological images related to his cancer during tumor board meeting.

## 2024-07-30 NOTE — Progress Notes (Signed)
 Radiation Oncology         [phone] ________________________________  Initial Outpatient Consultation  Name: Christopher [name] Colon MRN: [id number]  Date: 07/31/2024  DOB: 1961/02/12  RR:Djhjmipj, Emil Schanz, MD  Renda Glance, MD   REFERRING PHYSICIAN: Renda Glance, MD  DIAGNOSIS: 63 y.o. gentleman with a rising PSA of 0.2 s/p RALP 12/2019 for Stage pT2N0, Gleason 4+3 prostate cancer  No diagnosis found.  HISTORY OF PRESENT ILLNESS: Christopher [name] Colon is a 63 y.o. male with a diagnosis of biochemically recurrent prostate cancer. He was initially referred to Dr. Devere for a rising PSA. When his PSA reached 3.24, he underwent prostate biopsy on 10/19/19 that showed Gleason 4+3 prostate cancer. We met the patient on 11/17/19 to discuss potential radiation treatment options. He opted to proceed with RALP on 12/28/19 under Dr. Renda. Pathology revealed Gleason 4+3 prostatic adenocarcinoma with negative seminal vesicles, margins, and lymph nodes. His postoperative PSA was barely detectable at 0.018.  Since his surgery, his PSA has very slowly risen, breeching 1 in early 2025 and jumping to 0.22 by 04/2024. A repeat 6 weeks later confirmed the rise to 0.2. This prompted a restaging PSMA PET scan on 06/18/24 showing no evidence of active disease.  The patient reviewed the pathology and PSA results with his urologist and he has kindly been referred today for discussion of potential radiation treatment options.   PREVIOUS RADIATION THERAPY: No  PAST MEDICAL HISTORY:  Past Medical History:  Diagnosis Date   Cancer (HCC)    Phreesia 03/26/2020   GERD (gastroesophageal reflux disease) 10/31/2011   Hearing impaired 12/17/2013   lifelong    Hypogonadism male 10/31/2011   Prostate cancer (HCC)    PVC (premature ventricular contraction)       PAST SURGICAL HISTORY: Past Surgical History:  Procedure Laterality Date   LYMPHADENECTOMY N/A 12/28/2019   Procedure: LYMPHADENECTOMY;  Surgeon:  Renda Glance, MD;  Location: [facility];  Service: Urology;  Laterality: N/A;   PROSTATE BIOPSY     PROSTATE SURGERY N/A    Phreesia 03/26/2020   ROBOT ASSISTED LAPAROSCOPIC RADICAL PROSTATECTOMY N/A 12/28/2019   Procedure: XI ROBOTIC ASSISTED LAPAROSCOPIC RADICAL PROSTATECTOMY LEVEL 2;  Surgeon: Renda Glance, MD;  Location: [facility];  Service: Urology;  Laterality: N/A;   TONSILLECTOMY      FAMILY HISTORY:  Family History  Problem Relation Age of Onset   Hyperlipidemia Father    Prostate cancer Father        treated with ADT and XRT   Heart disease Mother    Hyperlipidemia Mother    Thyroid  cancer Sister    Breast cancer Neg Hx    Pancreatic cancer Neg Hx    Colon cancer Neg Hx     SOCIAL HISTORY:  Social History   Socioeconomic History   Marital status: Married    Spouse name: Not on file   Number of children: 2   Years of education: Not on file   Highest education level: Master's degree (e.g., MA, MS, MEng, MEd, MSW, MBA)  Occupational History   Occupation: Probation Officer: [employer]  Tobacco Use   Smoking status: Never   Smokeless tobacco: Never  Vaping Use   Vaping status: Never Used  Substance and Sexual Activity   Alcohol use: No   Drug use: No   Sexual activity: Yes  Other Topics Concern   Not on file  Social History Narrative   Married.  Education: Grade School.  Social Drivers of Health   Financial Resource Strain: Patient Declined (04/20/2024)   Overall Financial Resource Strain (CARDIA)    Difficulty of Paying Living Expenses: Patient declined  Food Insecurity: No Food Insecurity (07/28/2024)   Hunger Vital Sign    Worried About Running Out of Food in the Last Year: Never true    Ran Out of Food in the Last Year: Never true  Transportation Needs: No Transportation Needs (07/28/2024)   PRAPARE - Administrator, Civil Service (Medical): No    Lack of Transportation (Non-Medical): No  Physical Activity: Insufficiently  Active (04/20/2024)   Exercise Vital Sign    Days of Exercise per Week: 4 days    Minutes of Exercise per Session: 30 min  Stress: No Stress Concern Present (04/20/2024)   [employer] of Occupational Health - Occupational Stress Questionnaire    Feeling of Stress: Not at all  Social Connections: Unknown (04/20/2024)   Social Connection and Isolation Panel    Frequency of Communication with Friends and Family: Patient declined    Frequency of Social Gatherings with Friends and Family: Patient declined    Attends Religious Services: Patient declined    Database Administrator or Organizations: Patient declined    Attends Engineer, Structural: Not on file    Marital Status: Patient declined  Intimate Partner Violence: Not on file    ALLERGIES: Asa arthritis strength-antacid [aspirin buffered], Penicillins, and Sulfa antibiotics  MEDICATIONS:  Current Outpatient Medications  Medication Sig Dispense Refill   rosuvastatin  (CRESTOR ) 10 MG tablet Take 1 tablet (10 mg total) by mouth daily. 30 tablet 0   No current facility-administered medications for this encounter.    REVIEW OF SYSTEMS:  On review of systems, the patient reports that he is doing well overall. He denies any chest pain, shortness of breath, cough, fevers, chills, night sweats, unintended weight changes. He denies any bowel disturbances, and denies abdominal pain, nausea or vomiting. He denies any new musculoskeletal or joint aches or pains. His IPSS was ***, indicating *** urinary symptoms. His SHIM was ***, indicating he {does not have/likely has postoperative} erectile dysfunction. A complete review of systems is obtained and is otherwise negative.    PHYSICAL EXAM:  Wt Readings from Last 3 Encounters:  04/21/24 223 lb (101.2 kg)  11/12/23 207 lb (93.9 kg)  05/27/23 209 lb 12.8 oz (95.2 kg)   Temp Readings from Last 3 Encounters:  04/21/24 98.3 F (36.8 C) (Oral)  11/12/23 98.1 F (36.7 C) (Oral)  05/27/23  97.9 F (36.6 C) (Oral)   BP Readings from Last 3 Encounters:  04/21/24 128/78  11/12/23 122/72  05/27/23 118/72   Pulse Readings from Last 3 Encounters:  04/21/24 65  11/12/23 62  05/27/23 (!) 56    /10  In general this is a well appearing *** man in no acute distress. He's alert and oriented x4 and appropriate throughout the examination. Cardiopulmonary assessment is negative for acute distress, and he exhibits normal effort.     KPS = ***  100 - Normal; no complaints; no evidence of disease. 90   - Able to carry on normal activity; minor signs or symptoms of disease. 80   - Normal activity with effort; some signs or symptoms of disease. 88   - Cares for self; unable to carry on normal activity or to do active work. 60   - Requires occasional assistance, but is able to care for most of his personal needs. 50   -  Requires considerable assistance and frequent medical care. 40   - Disabled; requires special care and assistance. 30   - Severely disabled; hospital admission is indicated although death not imminent. 20   - Very sick; hospital admission necessary; active supportive treatment necessary. 10   - Moribund; fatal processes progressing rapidly. 0     - Dead  Karnofsky DA, Abelmann WH, Craver LS and Burchenal [facility] [phone]) The use of the nitrogen mustards in the palliative treatment of carcinoma: with particular reference to bronchogenic carcinoma Cancer 1 634-56  LABORATORY DATA:  Lab Results  Component Value Date   WBC 5.1 04/21/2024   HGB 14.0 04/21/2024   HCT 41.2 04/21/2024   MCV 84.6 04/21/2024   PLT 245.0 04/21/2024   Lab Results  Component Value Date   NA 138 04/21/2024   K 4.1 04/21/2024   CL 105 04/21/2024   CO2 27 04/21/2024   Lab Results  Component Value Date   ALT 30 04/21/2024   AST 23 04/21/2024   ALKPHOS 59 04/21/2024   BILITOT 1.1 04/21/2024     RADIOGRAPHY: No results found.    IMPRESSION/PLAN: 1. 63 y.o. gentleman with a rising PSA of  0.2 s/p RALP 12/2019 for Stage pT2N0, Gleason 4+3 prostate cancer Today I reviewed the findings and workup thus far.  We discussed the natural history of prostate cancer.  We reviewed the the implications of positive margins, extracapsular extension, and seminal vesicle involvement on the risk of prostate cancer recurrence. In his case, there were no high-risk features. We reviewed some of the evidence suggesting an advantage for patients who undergo adjuvant radiotherapy in the setting in terms of disease control and overall survival. We discussed radiation treatment directed to the prostatic fossa with regard to the logistics and delivery of external beam radiation treatment.  At the conclusion of our conversation, the patient is interested in moving forward with 7.5 weeks of salvage external beam therapy. We will share our discussion with Dr. Renda. The patient appears to have a good understanding of his disease and our treatment recommendations which are of salvage intent and is in agreement with the stated plan.  Therefore, we will move forward with treatment planning accordingly, in anticipation of beginning IMRT in the near future.   We spent {CHL ONC TIME VISIT - DTPQU:[id number]} face to face with the patient and more than 50% of that time was spent in counseling and/or coordination of care.    Sabra MICAEL Rusk, PA-C    Donnice Barge, MD  [facility]  Radiation Oncology Direct Dial: [phone]  Fax: [phone] [email or web address]  Skype  LinkedIn   This document serves as a record of services personally performed by Donnice Barge, MD and Sabra Rusk, PA-C. It was created on their behalf by Izetta Neither, a trained medical scribe. The creation of this record is based on the scribe's personal observations and the provider's statements to them. This document has been checked and approved by the attending provider.

## 2024-07-31 ENCOUNTER — Ambulatory Visit
Admission: RE | Admit: 2024-07-31 | Discharge: 2024-07-31 | Disposition: A | Discharge status: Home / Self Care | Source: Ambulatory Visit | Attending: Radiation Oncology | Admitting: Radiation Oncology

## 2024-07-31 ENCOUNTER — Encounter: Payer: Self-pay | Admitting: Radiation Oncology

## 2024-07-31 ENCOUNTER — Inpatient Hospital Stay

## 2024-07-31 VITALS — BP 137/67 | HR 66 | Temp 97.9°F | Resp 18 | Ht 70.0 in | Wt 242.4 lb

## 2024-07-31 DIAGNOSIS — C61 Malignant neoplasm of prostate: Secondary | ICD-10-CM | POA: Insufficient documentation

## 2024-07-31 HISTORY — DX: Elevated prostate specific antigen (PSA): R97.20

## 2024-07-31 HISTORY — DX: Hyperlipidemia, unspecified: E78.5

## 2024-07-31 NOTE — Consult Note (Signed)
 CC: Prostate Cancer PCP: Dr. Emil Schaumann Location of consult: [facility] - [facility]   [name] is a 63 year old healthy gentleman who is initially diagnosed with prostate cancer in February 2021 when he was noted to have a rising PSA up to 3.24. He underwent a biopsy by Dr. Medford Han that revealed Gleason 4+3 = 7 adenocarcinoma with 4 out of 12 biopsy cores positive for malignancy. He elected primary surgical treatment and underwent a robotic radical prostatectomy and bilateral pelvic lymphadenectomy on 12/28/2019. Surgical pathology indicated a pT2 N0 MX, Gleason 4+3 = 7 with tertiary pattern 5 disease with negative surgical margins. His PSA gradually began increasing on ultrasensitive testing in 2024 finally increased to 0.22 in August 2025 and confirmed to be biochemical recurrence in September 2025. A PSMA PET scan was performed on 06/18/2024 and demonstrated no evidence of uptake. Family history: His father was diagnosed with prostate cancer in his 57s and was treated with radiation therapy and androgen deprivation. Exam: Appearance: well developed and nourished, in no acute distress  Impression/Plan: 1. Biochemically recurrent prostate cancer: I did detailed discussion with [name] and his wife today regarding his biochemically recurrent prostate cancer. He has already met with Dr. Patrcia and is scheduled to meet with Dr. Tina later this morning. We had an in-depth discussion regarding his biochemically recurrent prostate cancer in the setting of his long life expectancy in relatively good health. We discussed primarily the option of salvage radiation therapy and the potential pros and cons including side effects associated with short-term androgen deprivation therapy along with salvage radiation therapy. We reviewed the potential risks of radiation therapy and the potential percentage chance benefit that he would  respond or receive cure from salvage radiation therapy. At this time, he is going to further consider his options but likely plans to proceed with salvage radiation therapy. He is undecided as to whether he would consider short-term androgen deprivation and whether he would prefer LHRH agonist injection versus oral LHRH antagonist therapy. He will notify me of his decision. Appropriate follow-up will be scheduled following his decision. Prostate Cancer (C61) Plan: Visit complexity. Medical care services associated with ongoing care, beyond the standard evaluation and management Indication: Provided medical care services as part of ongoing care related to the patient's single, serious or complex chronic condition. Plan: Counseling for Prostate Cancer. I counseled the patient regarding the following:   We discussed the diagnosis and management plan. Patient Specific Counseling:   EXAM: PET CT WHOLE BODY 06/18/2024 05:53:56 PM TECHNIQUE: RADIOPHARMACEUTICAL: 8.8 mCi POSLUMA/ RAC/ FMU BWM 1625 PET imaging was acquired from the skull vertex through the feet. Non-contrast enhanced computed tomography was obtained for attenuation correction and 1. PRELIMINARY Visit Note - July 31, 2024 Christopher [name], Christopher [name] MRN: [id number] Phone: [phone] DOB: 05-10-1961 Sex: Male PMS ID: [id number] [facility] (Primary Provider) (Bill Under) Page 2 [phone] Work [phone] Fax [facility] [address] [place] [address] [place], [place] [address] anatomic localization. COMPARISON: Abdominal and pelvic CT from [facility] dated 11/12/2019. CLINICAL HISTORY: Prostate cancer; Sx; No Tx; EOV. FINDINGS: HEAD AND NECK: No metabolically active cervical lymphadenopathy. CHEST: No metabolically active pulmonary nodules. No metabolically active lymphadenopathy. ABDOMEN AND PELVIS: Prostatectomy, without local recurrence. No metabolically active lymphadenopathy.  Physiologic activity within the gastrointestinal and genitourinary systems. Normal adrenal glands. No abdominal or pelvic adenopathy. BONES AND SOFT TISSUE: No abnormal FDG activity localizes to the bones. No metabolically active aggressive osseous  lesion. IMPRESSION: 1. Status post prostatectomy, without recurrent or tracer avid metastatic disease. Electronically signed by: Rockey Kilts MD 06/20/2024 11:55 AM EDT RP Workstation: [id number] Follow up for: Further Evaluation and Management

## 2024-07-31 NOTE — Assessment & Plan Note (Addendum)
 Recommend salvage radiation Patient is considering ADT Continue long-term follow-up with urology. calcium  (1000-1200 mg daily from food and supplements) and vitamin D3 (1000 IU daily) Healthy lifestyle to prevent diabetes and CV disease Weight-bearing exercises (30 minutes per day) Limit alcohol consumption and avoid smoking

## 2024-08-03 NOTE — Progress Notes (Signed)
 Patient's wife, Rock, called and requested for recent PMDC visit notes to be faxed to [facility] at [phone].  Patient may decide to pursue treatment in [place] due to residing there.    RN successfully faxed records.  Will continue to follow to ensure coordination of care.

## 2024-08-10 NOTE — Progress Notes (Signed)
 RN spoke with patient to follow up after recent PMDC visit.  Patient is still debating on location and treatment decision.  No additional questions at this time.  RN will follow up next week to review decision.

## 2024-08-20 ENCOUNTER — Other Ambulatory Visit: Payer: Self-pay | Admitting: Internal Medicine

## 2024-08-25 MED ORDER — ROSUVASTATIN CALCIUM 10 MG PO TABS: 10.0000 mg | ORAL_TABLET | Freq: Every day | ORAL | 0 refills | Status: DC

## 2024-08-27 NOTE — Progress Notes (Signed)
 RN spoke with patient to follow up with treatment decision.  Patient is still considering options and is meeting with a urology group in [facility] on 12/12 to discuss options as well due to him residing in [facility].    Patient agreeable for me to follow up next week to review.  All questions answered.

## 2024-09-03 NOTE — Progress Notes (Signed)
 Patient's wife called to update that patient has decided to pursue radiation treatment in [facility] since this will be there place of residence.  Patient and wife know to call anytime if they need anything additional from us .  Patient will keep his urology follow up's with Dr. Renda in June/July, but will notify if he decides to continue urology care in [facility] as well.   No additional needs at this time.

## 2024-09-21 ENCOUNTER — Other Ambulatory Visit: Payer: Self-pay | Admitting: Internal Medicine

## 2024-09-23 MED ORDER — ROSUVASTATIN CALCIUM 10 MG PO TABS: 10.0000 mg | ORAL_TABLET | Freq: Every day | ORAL | 0 refills | Status: AC

## 2024-10-22 ENCOUNTER — Ambulatory Visit: Admitting: Emergency Medicine
